# Patient Record
Sex: Female | Born: 1965
Health system: Southern US, Community
[De-identification: ages and names within clinical notes are randomized; demographics above are authoritative.]

## PROBLEM LIST (undated history)

## (undated) ENCOUNTER — Emergency Department (HOSPITAL_BASED_OUTPATIENT_CLINIC_OR_DEPARTMENT_OTHER): Admission: EM | Payer: Self-pay | Source: Home / Self Care

## (undated) DIAGNOSIS — F419 Anxiety disorder, unspecified: Secondary | ICD-10-CM

## (undated) DIAGNOSIS — I10 Essential (primary) hypertension: Secondary | ICD-10-CM

## (undated) DIAGNOSIS — Z8709 Personal history of other diseases of the respiratory system: Secondary | ICD-10-CM

## (undated) DIAGNOSIS — D649 Anemia, unspecified: Secondary | ICD-10-CM

## (undated) DIAGNOSIS — Z9889 Other specified postprocedural states: Secondary | ICD-10-CM

## (undated) DIAGNOSIS — M1990 Unspecified osteoarthritis, unspecified site: Secondary | ICD-10-CM

## (undated) DIAGNOSIS — K219 Gastro-esophageal reflux disease without esophagitis: Secondary | ICD-10-CM

## (undated) DIAGNOSIS — G43909 Migraine, unspecified, not intractable, without status migrainosus: Secondary | ICD-10-CM

## (undated) DIAGNOSIS — T7840XA Allergy, unspecified, initial encounter: Secondary | ICD-10-CM

## (undated) DIAGNOSIS — R112 Nausea with vomiting, unspecified: Secondary | ICD-10-CM

## (undated) DIAGNOSIS — F329 Major depressive disorder, single episode, unspecified: Secondary | ICD-10-CM

## (undated) DIAGNOSIS — E785 Hyperlipidemia, unspecified: Secondary | ICD-10-CM

## (undated) DIAGNOSIS — F32A Depression, unspecified: Secondary | ICD-10-CM

## (undated) DIAGNOSIS — I639 Cerebral infarction, unspecified: Secondary | ICD-10-CM

## (undated) DIAGNOSIS — J45909 Unspecified asthma, uncomplicated: Secondary | ICD-10-CM

## (undated) HISTORY — DX: Anxiety disorder, unspecified: F41.9

## (undated) HISTORY — DX: Essential (primary) hypertension: I10

## (undated) HISTORY — DX: Depression, unspecified: F32.A

## (undated) HISTORY — DX: Hyperlipidemia, unspecified: E78.5

## (undated) HISTORY — DX: Major depressive disorder, single episode, unspecified: F32.9

## (undated) HISTORY — DX: Anemia, unspecified: D64.9

## (undated) HISTORY — DX: Allergy, unspecified, initial encounter: T78.40XA

## (undated) HISTORY — PX: NO PAST SURGERIES: SHX2092

## (undated) HISTORY — PX: BREAST BIOPSY: SHX20

## (undated) HISTORY — DX: Gastro-esophageal reflux disease without esophagitis: K21.9

## (undated) HISTORY — DX: Unspecified osteoarthritis, unspecified site: M19.90

## (undated) HISTORY — DX: Migraine, unspecified, not intractable, without status migrainosus: G43.909

---

## 2001-12-27 ENCOUNTER — Emergency Department (HOSPITAL_COMMUNITY): Admission: EM | Admit: 2001-12-27 | Discharge: 2001-12-27 | Payer: Self-pay | Admitting: Emergency Medicine

## 2001-12-28 ENCOUNTER — Encounter: Payer: Self-pay | Admitting: Emergency Medicine

## 2003-10-21 ENCOUNTER — Emergency Department (HOSPITAL_COMMUNITY): Admission: EM | Admit: 2003-10-21 | Discharge: 2003-10-21 | Payer: Self-pay | Admitting: Emergency Medicine

## 2005-07-18 ENCOUNTER — Emergency Department (HOSPITAL_COMMUNITY): Admission: EM | Admit: 2005-07-18 | Discharge: 2005-07-18 | Payer: Self-pay | Admitting: Emergency Medicine

## 2006-09-27 ENCOUNTER — Inpatient Hospital Stay (HOSPITAL_COMMUNITY): Admission: AD | Admit: 2006-09-27 | Discharge: 2006-09-27 | Payer: Self-pay | Admitting: Obstetrics & Gynecology

## 2006-10-05 ENCOUNTER — Inpatient Hospital Stay (HOSPITAL_COMMUNITY): Admission: RE | Admit: 2006-10-05 | Discharge: 2006-10-05 | Payer: Self-pay | Admitting: Gynecology

## 2007-10-12 ENCOUNTER — Emergency Department (HOSPITAL_COMMUNITY): Admission: EM | Admit: 2007-10-12 | Discharge: 2007-10-12 | Payer: Self-pay | Admitting: Emergency Medicine

## 2007-11-09 ENCOUNTER — Emergency Department (HOSPITAL_COMMUNITY): Admission: EM | Admit: 2007-11-09 | Discharge: 2007-11-09 | Payer: Self-pay | Admitting: Emergency Medicine

## 2008-04-24 ENCOUNTER — Emergency Department (HOSPITAL_COMMUNITY): Admission: EM | Admit: 2008-04-24 | Discharge: 2008-04-24 | Payer: Self-pay | Admitting: Emergency Medicine

## 2009-08-02 ENCOUNTER — Encounter: Admission: RE | Admit: 2009-08-02 | Discharge: 2009-08-02 | Payer: Self-pay | Admitting: Internal Medicine

## 2009-08-27 ENCOUNTER — Encounter: Admission: RE | Admit: 2009-08-27 | Discharge: 2009-08-27 | Payer: Self-pay | Admitting: Infectious Diseases

## 2010-09-22 ENCOUNTER — Encounter: Payer: Self-pay | Admitting: Internal Medicine

## 2011-02-20 ENCOUNTER — Emergency Department (HOSPITAL_COMMUNITY)
Admission: EM | Admit: 2011-02-20 | Discharge: 2011-02-20 | Disposition: A | Discharge status: Home / Self Care | Payer: Self-pay | Attending: Emergency Medicine | Admitting: Emergency Medicine

## 2011-02-20 ENCOUNTER — Emergency Department (HOSPITAL_COMMUNITY): Payer: Self-pay

## 2011-02-20 DIAGNOSIS — M79609 Pain in unspecified limb: Secondary | ICD-10-CM | POA: Insufficient documentation

## 2011-02-20 DIAGNOSIS — M5430 Sciatica, unspecified side: Secondary | ICD-10-CM | POA: Insufficient documentation

## 2011-02-20 DIAGNOSIS — E669 Obesity, unspecified: Secondary | ICD-10-CM | POA: Insufficient documentation

## 2011-02-20 DIAGNOSIS — M545 Low back pain, unspecified: Secondary | ICD-10-CM | POA: Insufficient documentation

## 2011-05-16 ENCOUNTER — Emergency Department (HOSPITAL_COMMUNITY): Payer: Self-pay

## 2011-05-16 ENCOUNTER — Emergency Department (HOSPITAL_COMMUNITY)
Admission: EM | Admit: 2011-05-16 | Discharge: 2011-05-16 | Disposition: A | Discharge status: Home / Self Care | Payer: Self-pay | Attending: Emergency Medicine | Admitting: Emergency Medicine

## 2011-05-16 DIAGNOSIS — J069 Acute upper respiratory infection, unspecified: Secondary | ICD-10-CM | POA: Insufficient documentation

## 2011-05-16 DIAGNOSIS — R059 Cough, unspecified: Secondary | ICD-10-CM | POA: Insufficient documentation

## 2011-05-16 DIAGNOSIS — J3489 Other specified disorders of nose and nasal sinuses: Secondary | ICD-10-CM | POA: Insufficient documentation

## 2011-05-16 DIAGNOSIS — R05 Cough: Secondary | ICD-10-CM | POA: Insufficient documentation

## 2011-05-16 DIAGNOSIS — R062 Wheezing: Secondary | ICD-10-CM | POA: Insufficient documentation

## 2012-08-09 ENCOUNTER — Encounter (HOSPITAL_COMMUNITY): Payer: Self-pay

## 2012-08-09 ENCOUNTER — Emergency Department (HOSPITAL_COMMUNITY)
Admission: EM | Admit: 2012-08-09 | Discharge: 2012-08-09 | Disposition: A | Discharge status: Home / Self Care | Payer: Self-pay | Attending: Emergency Medicine | Admitting: Emergency Medicine

## 2012-08-09 DIAGNOSIS — H53149 Visual discomfort, unspecified: Secondary | ICD-10-CM | POA: Insufficient documentation

## 2012-08-09 DIAGNOSIS — R42 Dizziness and giddiness: Secondary | ICD-10-CM | POA: Insufficient documentation

## 2012-08-09 DIAGNOSIS — R51 Headache: Secondary | ICD-10-CM | POA: Insufficient documentation

## 2012-08-09 DIAGNOSIS — R1110 Vomiting, unspecified: Secondary | ICD-10-CM | POA: Insufficient documentation

## 2012-08-09 LAB — URINALYSIS, ROUTINE W REFLEX MICROSCOPIC
Bilirubin Urine: NEGATIVE
Glucose, UA: NEGATIVE mg/dL
Hgb urine dipstick: NEGATIVE
Ketones, ur: NEGATIVE mg/dL
Nitrite: NEGATIVE
Protein, ur: NEGATIVE mg/dL
Specific Gravity, Urine: 1.014 (ref 1.005–1.030)
Urobilinogen, UA: 0.2 mg/dL (ref 0.0–1.0)
pH: 7.5 (ref 5.0–8.0)

## 2012-08-09 MED ORDER — DIPHENHYDRAMINE HCL 50 MG/ML IJ SOLN
25.0000 mg | Freq: Once | INTRAMUSCULAR | Status: AC
Start: 2012-08-09 — End: 2012-08-09
  Administered 2012-08-09: 25 mg via INTRAVENOUS
  Filled 2012-08-09: qty 1

## 2012-08-09 MED ORDER — METOCLOPRAMIDE HCL 5 MG/ML IJ SOLN
20.0000 mg | Freq: Once | INTRAVENOUS | Status: AC
  Administered 2012-08-09: 20 mg via INTRAVENOUS
  Filled 2012-08-09: qty 4

## 2012-08-09 MED ORDER — SODIUM CHLORIDE 0.9 % IV BOLUS (SEPSIS)
1000.0000 mL | Freq: Once | INTRAVENOUS | Status: AC
  Administered 2012-08-09: 1000 mL via INTRAVENOUS

## 2012-08-09 MED ORDER — DEXAMETHASONE SODIUM PHOSPHATE 10 MG/ML IJ SOLN
10.0000 mg | Freq: Once | INTRAMUSCULAR | Status: AC
  Administered 2012-08-09: 10 mg via INTRAVENOUS
  Filled 2012-08-09: qty 1

## 2012-08-09 MED ORDER — KETOROLAC TROMETHAMINE 30 MG/ML IJ SOLN
15.0000 mg | Freq: Once | INTRAMUSCULAR | Status: AC
  Administered 2012-08-09: 15 mg via INTRAVENOUS
  Filled 2012-08-09: qty 1

## 2012-08-09 MED ORDER — BUTALBITAL-APAP-CAFFEINE 50-325-40 MG PO TABS: 1.0000 | ORAL_TABLET | Freq: Four times a day (QID) | ORAL | Status: DC | PRN

## 2012-08-09 NOTE — ED Provider Notes (Signed)
History     CSN: [id number]  Arrival date & time 08/09/12  4098   First MD Initiated Contact with Patient 08/09/12 [phone]      Chief Complaint  Patient presents with  . Headache     HPI  The patient presents with a headache.  This headache began approximately one week ago.  Since onset the been progressive.  She describes pain throughout the anterior head.  The pain is throbbing.  There is associated photophobia, nausea.  She had one episode of emesis.  She notes no improvement with OTC medication.  She denies distal weakness or discoordination, or disequilibrium. She denies fevers, chills, diarrhea, incontinence, ataxia. She has a history of headaches, though she states that this is more severe.  She has been evaluated previously with CT.  Notably, the patient recently started a new position, and she describes significant increase in her stress level.  She also spends more time and previously on a computer, squinting according to her and her husband.  History reviewed. No pertinent past medical history.  History reviewed. No pertinent past surgical history.  Family History  Problem Relation Age of Onset  . Diabetes Mother   . Stroke Mother   . Cancer Mother   . Diabetes Father     History  Substance Use Topics  . Smoking status: Never Smoker   . Smokeless tobacco: Not on file  . Alcohol Use: Yes     Comment: occasional    OB History    Grav Para Term Preterm Abortions TAB SAB Ect Mult Living                  Review of Systems  Constitutional:       Per HPI, otherwise negative  HENT:       Per HPI, otherwise negative  Eyes: Negative.   Respiratory:       Per HPI, otherwise negative  Cardiovascular:       Per HPI, otherwise negative  Gastrointestinal: Positive for nausea and vomiting.  Genitourinary: Negative.   Musculoskeletal:       Per HPI, otherwise negative  Skin: Negative.   Neurological: Positive for light-headedness and headaches. Negative for  dizziness, tremors, seizures, syncope, facial asymmetry, speech difficulty, weakness and numbness.    Allergies  Review of patient's allergies indicates not on file.  Home Medications  No current outpatient prescriptions on file.  BP 141/71  Pulse 71  Temp 98.6 F (37 C) (Oral)  Resp 16  SpO2 96%  LMP 08/06/2012  Physical Exam  Nursing note and vitals reviewed. Constitutional: She is oriented to person, place, and time. She appears well-developed and well-nourished. No distress.  HENT:  Head: Normocephalic and atraumatic.  Eyes: Conjunctivae normal and EOM are normal. Pupils are equal, round, and reactive to light. Right eye exhibits no discharge. Left eye exhibits no discharge. No scleral icterus.  Cardiovascular: Normal rate and regular rhythm.   Pulmonary/Chest: Effort normal and breath sounds normal. No stridor. No respiratory distress.  Abdominal: She exhibits no distension.  Musculoskeletal: She exhibits no edema.  Neurological: She is alert and oriented to person, place, and time. She displays no atrophy and no tremor. No cranial nerve deficit or sensory deficit. She exhibits normal muscle tone. She displays no seizure activity. Coordination normal.  Skin: Skin is warm and dry.  Psychiatric: She has a normal mood and affect.    ED Course  Procedures (including critical care time)   Labs Reviewed  URINALYSIS, ROUTINE  W REFLEX MICROSCOPIC   No results found.   No diagnosis found.   11:59 AM Patient resting.  She states that she feels substantially better. MDM  This generally well-appearing female, with history of headaches now presents with a severe headache.  On exam she is neurovascularly intact throughout, and in no distress.  Given the patient's prior evaluation, including CT, imaging was not indicated.  Following fluids, analgesics patient was substantially improved.  Absent concerning signs or symptoms the patient was discharged to follow up with her primary  care physician.  She was provided a short course of analgesics, instructed to discuss this, and consider alternatives with her primary care physician as soon as possible to  Gerhard Munch, MD 08/09/12 1200

## 2012-08-09 NOTE — ED Notes (Signed)
Patient reports that she has been having a mild headache x several days, but today she woke at 0400 with increased pain, nausea, vomiting, photophobia, and sensitivity with sound. Patient denies having a history of headaches/migraines.

## 2012-08-09 NOTE — Progress Notes (Signed)
Pt listed with no insurance coverage CM and [facility] liaison spoke with him Pt offered services to assist with finding a [place] self pay provider & health reform information 

## 2012-10-17 ENCOUNTER — Emergency Department (HOSPITAL_COMMUNITY): Payer: Self-pay

## 2012-10-17 ENCOUNTER — Emergency Department (HOSPITAL_COMMUNITY)
Admission: EM | Admit: 2012-10-17 | Discharge: 2012-10-17 | Disposition: A | Discharge status: Home / Self Care | Payer: Self-pay | Attending: Emergency Medicine | Admitting: Emergency Medicine

## 2012-10-17 ENCOUNTER — Encounter (HOSPITAL_COMMUNITY): Payer: Self-pay | Admitting: Emergency Medicine

## 2012-10-17 DIAGNOSIS — R6889 Other general symptoms and signs: Secondary | ICD-10-CM

## 2012-10-17 DIAGNOSIS — J3489 Other specified disorders of nose and nasal sinuses: Secondary | ICD-10-CM | POA: Insufficient documentation

## 2012-10-17 DIAGNOSIS — R0602 Shortness of breath: Secondary | ICD-10-CM | POA: Insufficient documentation

## 2012-10-17 DIAGNOSIS — R071 Chest pain on breathing: Secondary | ICD-10-CM | POA: Insufficient documentation

## 2012-10-17 DIAGNOSIS — J45909 Unspecified asthma, uncomplicated: Secondary | ICD-10-CM | POA: Insufficient documentation

## 2012-10-17 DIAGNOSIS — R51 Headache: Secondary | ICD-10-CM | POA: Insufficient documentation

## 2012-10-17 HISTORY — DX: Unspecified asthma, uncomplicated: J45.909

## 2012-10-17 LAB — CBC
Hemoglobin: 12.4 g/dL (ref 12.0–15.0)
RBC: 4.14 MIL/uL (ref 3.87–5.11)

## 2012-10-17 LAB — BASIC METABOLIC PANEL
BUN: 8 mg/dL (ref 6–23)
CO2: 24 mEq/L (ref 19–32)
Chloride: 104 mEq/L (ref 96–112)
GFR calc Af Amer: >90 mL/min (ref >90)

## 2012-10-17 LAB — POCT I-STAT TROPONIN I: Troponin i, poc: 0 ng/mL (ref 0.00–0.08)

## 2012-10-17 MED ORDER — PREDNISONE 10 MG PO TABS: 20.0000 mg | ORAL_TABLET | Freq: Every day | ORAL | Status: DC

## 2012-10-17 MED ORDER — GUAIFENESIN 100 MG/5ML PO LIQD: 100.0000 mg | ORAL | Status: DC | PRN

## 2012-10-17 MED ORDER — HYDROCODONE-ACETAMINOPHEN 7.5-325 MG/15ML PO SOLN: 10.0000 mL | Freq: Four times a day (QID) | ORAL | Status: DC | PRN

## 2012-10-17 NOTE — ED Notes (Signed)
Discharge instructions reviewed w/ pt verbalizes understanding. Three prescriptions provided at discharge.

## 2012-10-17 NOTE — ED Provider Notes (Signed)
Medical screening examination/treatment/procedure(s) were performed by non-physician practitioner and as supervising physician I was immediately available for consultation/collaboration.   Richardean Canal, MD 10/17/12 1046

## 2012-10-17 NOTE — ED Notes (Signed)
Patient reports that she has had cough and cold since Tues. The patient also having right sided chest pain with rest and exertion. The patient has had to use Inhaler recently.

## 2012-10-17 NOTE — ED Provider Notes (Signed)
History     CSN: [id number]  Arrival date & time 10/17/12  1191   First MD Initiated Contact with Patient 10/17/12 1007      Chief Complaint  Patient presents with  . Shortness of Breath  . Chest Pain    (Consider location/radiation/quality/duration/timing/severity/associated sxs/prior treatment) HPI  47 year old female with history of asthma presents complaining of flulike symptoms. Patient reports for the past 4 days she developed gradual onset of headache, runny nose, nasal congestion, sneezing, throat irritation, nonproductive cough, pleuritic chest pain, and increased shortness of breath. States the shortness of breath is constant, moderate in severity requiring for her to use the albuterol inhaler, 2 puffs, every 3 hours for the past 3 days. She feels weak and tired, and has lack of appetite. She has tried taking Alka-Seltzer, and NyQuil, with minimal relief. Patient denies lightheadedness on dizziness. Denies exertional chest pain, dyspnea on exertion, or hemoptysis, nausea, vomiting, diarrhea, abdominal pain, back pain, dysuria, or rash. Patient is a nonsmoker. She has a fairly uncomplicated history of asthma with no prior history of intubation, ICU stay.   Past Medical History  Diagnosis Date  . Asthma     History reviewed. No pertinent past surgical history.  Family History  Problem Relation Age of Onset  . Diabetes Mother   . Stroke Mother   . Cancer Mother   . Diabetes Father     History  Substance Use Topics  . Smoking status: Never Smoker   . Smokeless tobacco: Not on file  . Alcohol Use: Yes     Comment: occasional    OB History   Grav Para Term Preterm Abortions TAB SAB Ect Mult Living                  Review of Systems  Constitutional:       10 Systems reviewed and all are negative for acute change except as noted in the HPI.     Allergies  Review of patient's allergies indicates no known allergies.  Home Medications   Current Outpatient Rx   Name  Route  Sig  Dispense  Refill  . butalbital-acetaminophen-caffeine (FIORICET) 50-325-40 MG per tablet   Oral   Take 1 tablet by mouth every 6 (six) hours as needed for headache.   15 tablet   0   . Multiple Vitamin (MULTIVITAMIN WITH MINERALS) TABS   Oral   Take 1 tablet by mouth daily.           BP 122/81  Pulse 85  Temp(Src) 98.9 F (37.2 C) (Oral)  SpO2 98%  LMP 09/28/2012  Physical Exam  Nursing note and vitals reviewed. Constitutional: She is oriented to person, place, and time. She appears well-developed and well-nourished. No distress.  Awake, alert, nontoxic appearance  HENT:  Head: Atraumatic.  Right Ear: External ear normal.  Left Ear: External ear normal.  Nose: Nose normal.  Mouth/Throat: Oropharynx is clear and moist. No oropharyngeal exudate.  Eyes: Conjunctivae are normal. Right eye exhibits no discharge. Left eye exhibits no discharge.  Neck: Neck supple. No JVD present.  Cardiovascular: Normal rate and regular rhythm.   Pulmonary/Chest: Effort normal. No respiratory distress. She has no wheezes. She has no rales. She exhibits no tenderness.  Abdominal: Soft. There is no tenderness. There is no rebound.  Musculoskeletal: Normal range of motion. She exhibits no edema and no tenderness.  ROM appears intact, no obvious focal weakness  Lymphadenopathy:    She has no cervical adenopathy.  Neurological:  She is alert and oriented to person, place, and time.  Mental status and motor strength appears intact  Skin: No rash noted.  Psychiatric: She has a normal mood and affect.    ED Course  Procedures (including critical care time)  Labs Reviewed  BASIC METABOLIC PANEL - Abnormal; Notable for the following:    Glucose, Bld 102 (*)    Creatinine, Ser 0.48 (*)    All other components within normal limits  CBC  POCT I-STAT TROPONIN I   Dg Chest 2 View  10/17/2012  *RADIOLOGY REPORT*  Clinical Data: Shortness of breath, chest pain.  CHEST - 2 VIEW   Comparison: 05/16/2011  Findings: Mild peribronchial thickening. Heart and mediastinal contours are within normal limits.  No focal opacities or effusions.  No acute bony abnormality.  IMPRESSION: No active cardiopulmonary disease.   Original Report Authenticated By: Charlett Nose, M.D.      No diagnosis found.  10:23 AM  Patient was seen and evaluate by me for flulike symptoms. She appear to be in no acute respiratory distress. She is afebrile with stable normal vital sign. Her workup including chest x-rays and blood work appears to be unremarkable. I have very low suspicion for cardiac etiology. Plan to treat patient symptomatically with prednisone taper course, hycet elixir for her cough and pleuritic chest pain, and guaifenesin for her nasal congestion. Return precautions discussed. Patient stable for discharge.  BP 122/81  Pulse 85  Temp(Src) 98.9 F (37.2 C) (Oral)  SpO2 98%  LMP 09/28/2012  I have reviewed nursing notes and vital signs. I personally reviewed the imaging tests through PACS system  I reviewed available ER/hospitalization records thought the EMR  1. Flu-like symptoms MDM          Fayrene Helper, PA-C 10/17/12 1025

## 2013-09-01 DIAGNOSIS — I639 Cerebral infarction, unspecified: Secondary | ICD-10-CM

## 2013-09-01 HISTORY — DX: Cerebral infarction, unspecified: I63.9

## 2014-01-26 ENCOUNTER — Telehealth: Payer: Self-pay | Admitting: *Deleted

## 2014-01-26 ENCOUNTER — Encounter: Payer: Self-pay | Admitting: Family Medicine

## 2014-01-26 NOTE — Telephone Encounter (Signed)
Dr Ronnald Ramp called. He states he has faxed an EKG for an cardiologist here to evaluate. Per Dr Ronnald Ramp-- patient is 48 yr old lady; complaining of chest fluttering. Dr Ronnald Ramp states when he listen heart sound is normal and EKG looks normal.   Dr. Ronnald Ramp would like a doctor to review EKG TO SEE IF PATIENT NEED ANY SERVICE FROM CARDIOLOGIST. RN received EKG.  EKG was reviewed by Dr Sallyanne Kuster..  Dr Sallyanne Kuster states he agrees with the interpretation on the EKG.  Dr Sallyanne Kuster suggests if issue is daily-holter monitor, if spartic - event monitor,and echo.RN relayed message to Dr. Ronnald Ramp. Dr Ronnald Ramp voiced thanks, and stated he would start a beta blocker now because her blood pressure is elevated. If not resolved he states he will send patient to be evaluated by cardiologist at [facility].

## 2014-04-24 ENCOUNTER — Emergency Department (HOSPITAL_COMMUNITY): Payer: BC Managed Care – PPO

## 2014-04-24 ENCOUNTER — Inpatient Hospital Stay (HOSPITAL_COMMUNITY)
Admission: EM | Admit: 2014-04-24 | Discharge: 2014-04-27 | DRG: 103 | Disposition: A | Discharge status: Home / Self Care | Payer: BC Managed Care – PPO | Attending: Internal Medicine | Admitting: Internal Medicine

## 2014-04-24 ENCOUNTER — Encounter (HOSPITAL_COMMUNITY): Payer: Self-pay | Admitting: Emergency Medicine

## 2014-04-24 DIAGNOSIS — G43109 Migraine with aura, not intractable, without status migrainosus: Secondary | ICD-10-CM | POA: Diagnosis present

## 2014-04-24 DIAGNOSIS — I639 Cerebral infarction, unspecified: Secondary | ICD-10-CM | POA: Diagnosis present

## 2014-04-24 DIAGNOSIS — R5381 Other malaise: Secondary | ICD-10-CM

## 2014-04-24 DIAGNOSIS — Z833 Family history of diabetes mellitus: Secondary | ICD-10-CM | POA: Diagnosis not present

## 2014-04-24 DIAGNOSIS — Z87891 Personal history of nicotine dependence: Secondary | ICD-10-CM | POA: Diagnosis not present

## 2014-04-24 DIAGNOSIS — I6350 Cerebral infarction due to unspecified occlusion or stenosis of unspecified cerebral artery: Secondary | ICD-10-CM

## 2014-04-24 DIAGNOSIS — G8190 Hemiplegia, unspecified affecting unspecified side: Secondary | ICD-10-CM | POA: Diagnosis present

## 2014-04-24 DIAGNOSIS — J45909 Unspecified asthma, uncomplicated: Secondary | ICD-10-CM | POA: Diagnosis present

## 2014-04-24 DIAGNOSIS — R531 Weakness: Secondary | ICD-10-CM | POA: Diagnosis present

## 2014-04-24 DIAGNOSIS — Z79899 Other long term (current) drug therapy: Secondary | ICD-10-CM | POA: Diagnosis not present

## 2014-04-24 DIAGNOSIS — Z823 Family history of stroke: Secondary | ICD-10-CM

## 2014-04-24 DIAGNOSIS — R5383 Other fatigue: Secondary | ICD-10-CM

## 2014-04-24 DIAGNOSIS — R29898 Other symptoms and signs involving the musculoskeletal system: Secondary | ICD-10-CM | POA: Diagnosis present

## 2014-04-24 DIAGNOSIS — I6340 Cerebral infarction due to embolism of unspecified cerebral artery: Secondary | ICD-10-CM

## 2014-04-24 LAB — URINALYSIS, ROUTINE W REFLEX MICROSCOPIC
Bilirubin Urine: NEGATIVE
Glucose, UA: NEGATIVE mg/dL
Hgb urine dipstick: NEGATIVE
Ketones, ur: NEGATIVE mg/dL
Leukocytes, UA: NEGATIVE
Nitrite: NEGATIVE
Protein, ur: 30 mg/dL — AB
Specific Gravity, Urine: 1.014 (ref 1.005–1.030)
Urobilinogen, UA: 0.2 mg/dL (ref 0.0–1.0)
pH: 6 (ref 5.0–8.0)

## 2014-04-24 LAB — BASIC METABOLIC PANEL
Anion gap: 12 (ref 5–15)
BUN: 10 mg/dL (ref 6–23)
CO2: 21 mEq/L (ref 19–32)
Calcium: 9 mg/dL (ref 8.4–10.5)
Chloride: 106 mEq/L (ref 96–112)
Creatinine, Ser: 0.57 mg/dL (ref 0.50–1.10)
GFR calc Af Amer: >90 mL/min (ref >90)
GFR calc non Af Amer: >90 mL/min (ref >90)
Glucose, Bld: 101 mg/dL — ABNORMAL HIGH (ref 70–99)
Potassium: 4.3 mEq/L (ref 3.7–5.3)
Sodium: 139 mEq/L (ref 137–147)

## 2014-04-24 LAB — PREGNANCY, URINE: Preg Test, Ur: NEGATIVE

## 2014-04-24 LAB — RAPID URINE DRUG SCREEN, HOSP PERFORMED
AMPHETAMINES: NOT DETECTED
BENZODIAZEPINES: NOT DETECTED
Barbiturates: NOT DETECTED
COCAINE: NOT DETECTED
OPIATES: NOT DETECTED
TETRAHYDROCANNABINOL: NOT DETECTED

## 2014-04-24 LAB — CBC
HCT: 34.9 % — ABNORMAL LOW (ref 36.0–46.0)
Hemoglobin: 12 g/dL (ref 12.0–15.0)
MCH: 29.7 pg (ref 26.0–34.0)
MCHC: 34.4 g/dL (ref 30.0–36.0)
MCV: 86.4 fL (ref 78.0–100.0)
Platelets: 264 10*3/uL (ref 150–400)
RBC: 4.04 MIL/uL (ref 3.87–5.11)
RDW: 13.1 % (ref 11.5–15.5)
WBC: 5 10*3/uL (ref 4.0–10.5)

## 2014-04-24 LAB — I-STAT TROPONIN, ED: Troponin i, poc: 0.01 ng/mL (ref 0.00–0.08)

## 2014-04-24 LAB — URINE MICROSCOPIC-ADD ON

## 2014-04-24 MED ORDER — MORPHINE SULFATE 2 MG/ML IJ SOLN
2.0000 mg | INTRAMUSCULAR | Status: DC | PRN
  Administered 2014-04-24 – 2014-04-26 (×3): 2 mg via INTRAVENOUS
  Filled 2014-04-24 (×3): qty 1

## 2014-04-24 MED ORDER — PNEUMOCOCCAL VAC POLYVALENT 25 MCG/0.5ML IJ INJ
0.5000 mL | INJECTION | INTRAMUSCULAR | Status: AC
  Administered 2014-04-25: 0.5 mL via INTRAMUSCULAR

## 2014-04-24 MED ORDER — ONDANSETRON HCL 4 MG/2ML IJ SOLN
4.0000 mg | Freq: Once | INTRAMUSCULAR | Status: AC
  Administered 2014-04-24: 4 mg via INTRAVENOUS
  Filled 2014-04-24: qty 2

## 2014-04-24 MED ORDER — MORPHINE SULFATE 4 MG/ML IJ SOLN
4.0000 mg | Freq: Once | INTRAMUSCULAR | Status: AC
  Administered 2014-04-24: 4 mg via INTRAVENOUS
  Filled 2014-04-24: qty 1

## 2014-04-24 MED ORDER — LORAZEPAM 2 MG/ML IJ SOLN
1.0000 mg | Freq: Once | INTRAMUSCULAR | Status: DC
  Filled 2014-04-24: qty 1

## 2014-04-24 MED ORDER — ASPIRIN 81 MG PO CHEW
324.0000 mg | CHEWABLE_TABLET | Freq: Once | ORAL | Status: AC
  Administered 2014-04-24: 324 mg via ORAL
  Filled 2014-04-24: qty 4

## 2014-04-24 MED ORDER — MORPHINE SULFATE 2 MG/ML IJ SOLN
2.0000 mg | Freq: Once | INTRAMUSCULAR | Status: AC
  Administered 2014-04-24: 2 mg via INTRAVENOUS
  Filled 2014-04-24: qty 1

## 2014-04-24 MED ORDER — SODIUM CHLORIDE 0.9 % IV SOLN
INTRAVENOUS | Status: AC
  Administered 2014-04-24: 19:00:00 via INTRAVENOUS

## 2014-04-24 MED ORDER — STROKE: EARLY STAGES OF RECOVERY BOOK
Freq: Once | Status: AC
  Administered 2014-04-25: 12:00:00
  Filled 2014-04-24: qty 1

## 2014-04-24 MED ORDER — SODIUM CHLORIDE 0.9 % IV SOLN: INTRAVENOUS | Status: DC

## 2014-04-24 MED ORDER — ONDANSETRON HCL 4 MG/2ML IJ SOLN
4.0000 mg | Freq: Four times a day (QID) | INTRAMUSCULAR | Status: DC | PRN
  Administered 2014-04-25: 4 mg via INTRAVENOUS
  Filled 2014-04-24: qty 2

## 2014-04-24 NOTE — ED Notes (Signed)
Patient transported to CT 

## 2014-04-24 NOTE — Progress Notes (Signed)
Report recd from Jana Half, [place] at [facility] at [place]. Pt arrived to unit per stretcher from [facility] with ambulance personnel and transferred to bed at [facility].  Assessment performed. Family member at bedside. Swallow study performed as charted. Paged admission for MD on call for pt for diet order.

## 2014-04-24 NOTE — ED Notes (Signed)
Pt sent to unit.  [facility] admit canceled and changed to [facility].  Pt brought back to ED Rm 25

## 2014-04-24 NOTE — ED Notes (Signed)
Pt states 3 days ago pt had pain in legs where she gets up from her desk they would go numb. States last night center chest began to hurt and this morning her left arm feels heavy. Denis SOB or n/v.

## 2014-04-24 NOTE — H&P (Addendum)
[facility] History and Physical  OLEVA KOO NWG:[id number] DOB: Sep 24, 1965 DOA: 04/24/2014  Referring physician: ER physician PCP: Philis Fendt, MD   Chief Complaint: bilateral LE weakness  HPI:  Pt is 48 yo female who presented to [facility] ED with main concern of one day duration of bilateral LE weakness L>R, progressed to involve left arm weakness, associated with tingling. Pt also reports several episodes of 15 minutes long chest pains, located in the center of the chest, 5/10 in severity, occasionally but not consistently associated with left arm and leg numbness. Pt denies any specific alleviating factors, no similar events in the past. The patient has a significant family hx of stroke: her mother and grandmother have both had "massive strokes" and are deceased. She denies nausea, vomiting, diaphoresis.  In ED, pt noted to be hemodynamically stable, not a tPA candidate due to onset of symptoms > 4 hours. Due to concern of stroke/TIA, transfer to [facility] recommended for further work up.   Assessment & Plan    Active Problems: Bilateral LE weakness with left arm weakness and numbness - will admit to telemetry unit at [facility] hospital - proceed with MRI brain - place order for SLP, PT/OT - carotid doppler, 2 D ECHO - lipid panel, A1C - provide aspirin - advance diet once swallow eval passed    DVT prophylaxis: SCD's  Radiological Exams on Admission:  Dg Chest 2 View  04/24/2014  Normal chest x-ray.    Ct Head Wo Contrast  04/24/2014   There is no acute intracranial abnormality.     EKG: pending   Code Status: Full Family Communication: Plan of care discussed with the patient  Disposition Plan: Admit for further evaluation  Leisa Lenz, MD  [facility] Pager [phone]  Review of Systems:  Constitutional: Negative for fever, chills and malaise/fatigue. Negative for diaphoresis.  HENT: Negative for hearing loss, ear pain, nosebleeds, congestion, sore throat,   Eyes: Negative for blurred vision, double vision, photophobia, pain, discharge and redness.  Respiratory: Negative for cough, hemoptysis, sputum production, shortness of breath, wheezing and stridor.   Cardiovascular: Negative for chest pain, palpitations, orthopnea, claudication and leg swelling.  Gastrointestinal: Negative for nausea, vomiting and abdominal pain.  Genitourinary: Negative for dysuria, urgency, frequency, hematuria and flank pain.  Musculoskeletal: Negative for myalgias, back pain, joint pain and falls.  Skin: Negative for itching and rash.  Neurological: Per HPI  Endo/Heme/Allergies: Negative for environmental allergies and polydipsia. Does not bruise/bleed easily.  Psychiatric/Behavioral: Negative for suicidal ideas. The patient is not nervous/anxious.      Past Medical History  Diagnosis Date  . Asthma    Past Surgical History  Procedure Laterality Date  . No past surgeries     Social History:  reports that she quit smoking about 6 years ago. She has never used smokeless tobacco. She reports that she drinks alcohol. She reports that she does not use illicit drugs.  Allergies  Allergen Reactions  . Shellfish Allergy Hives    Family History:  Family History  Problem Relation Age of Onset  . Diabetes Mother   . Stroke Mother   . Cancer Mother   . Diabetes Father      Prior to Admission medications   Medication Sig Start Date End Date Taking? Authorizing Provider  albuterol (PROVENTIL HFA;VENTOLIN HFA) 108 (90 BASE) MCG/ACT inhaler Inhale 2 puffs into the lungs every 6 (six) hours as needed for wheezing.   Yes Historical Provider, MD  aspirin-acetaminophen-caffeine (EXCEDRIN MIGRAINE) [phone] MG  per tablet Take 1 tablet by mouth once as needed for migraine.    Yes Historical Provider, MD  diphenhydrAMINE (BENADRYL) 25 MG tablet Take 25 mg by mouth daily as needed for allergies.   Yes Historical Provider, MD  naproxen sodium (ANAPROX) 220 MG tablet Take  660 mg by mouth once as needed (pain.).   Yes Historical Provider, MD   Physical Exam: Filed Vitals:   04/24/14 1205 04/24/14 1528  BP: 140/95 145/87  Pulse: 80 76  Temp: 98.8 F (37.1 C)   TempSrc: Oral   Resp: 18 18  SpO2: 98% 96%    Physical Exam  Constitutional: Appears well-developed and well-nourished. No distress.  HENT: Normocephalic. No tonsillar erythema or exudates Eyes: Conjunctivae and EOM are normal. PERRLA, no scleral icterus.  Neck: Normal ROM. Neck supple. No JVD. No tracheal deviation. No thyromegaly.  CVS: RRR, S1/S2 +, no murmurs, no gallops, no carotid bruit.  Pulmonary: Effort and breath sounds normal, no stridor, rhonchi, wheezes, rales.  Abdominal: Soft. BS +,  no distension, tenderness, rebound or guarding.  Musculoskeletal: Normal range of motion. No edema and no tenderness.  Lymphadenopathy: No lymphadenopathy noted, cervical, inguinal. Neuro: Alert. Normal reflexes, muscle tone coordination. No focal neurologic deficits. Skin: Skin is warm and dry. No rash noted. Not diaphoretic. No erythema. No pallor.  Psychiatric: Normal mood and affect. Behavior, judgment, thought content normal.   Labs on Admission:  Basic Metabolic Panel:  Recent Labs Lab 04/24/14 1219  NA 139  K 4.3  CL 106  CO2 21  GLUCOSE 101*  BUN 10  CREATININE 0.57  CALCIUM 9.0   CBC:  Recent Labs Lab 04/24/14 1219  WBC 5.0  HGB 12.0  HCT 34.9*  MCV 86.4  PLT 264   If 7PM-7AM, please contact night-coverage [email or web address] Password [facility] 04/24/2014, 3:51 PM

## 2014-04-24 NOTE — ED Notes (Signed)
Bed: YC14 Expected date: 04/24/14 Expected time: 12:04 PM Means of arrival: Ambulance Comments: [facility]

## 2014-04-24 NOTE — ED Notes (Signed)
[facility] here for transport.  

## 2014-04-24 NOTE — ED Provider Notes (Signed)
CSN: [id number]     Arrival date & time 04/24/14  1155 History   First MD Initiated Contact with Patient 04/24/14 1206     Chief Complaint  Patient presents with  . Chest Pain     (Consider location/radiation/quality/duration/timing/severity/associated sxs/prior Treatment) HPI  Patient to the ED with complaints of bilateral lower extremity weakness and pain, most severe to the left leg, left arm weakness and chest pains. The symptoms started at work yesterday with tingling and weakness. As the night progressed she started to have increased weakness to the left leg and developed pain to the center of her chest. This morning she woke up with left leg and left arm weakness and chest pain. She reports feeling as though she is dragging the left foot mildly but also having swelling and pain to bilateral lower extremities. The patient has a significant family hx of stroke her mother and grandmother have both had "massive strokes" and are deceased. She denies nausea, vomiting, diaphoresis, diarrhea,  Aphagia, SOB, change in vision, difficulty remembering or talking. She endorses doing more physical activity for work last night than she is used to.  Past Medical History  Diagnosis Date  . Asthma    History reviewed. No pertinent past surgical history. Family History  Problem Relation Age of Onset  . Diabetes Mother   . Stroke Mother   . Cancer Mother   . Diabetes Father    History  Substance Use Topics  . Smoking status: Never Smoker   . Smokeless tobacco: Not on file  . Alcohol Use: Yes     Comment: occasional   OB History   Grav Para Term Preterm Abortions TAB SAB Ect Mult Living                 Review of Systems  Constitutional: Positive for fatigue. Negative for fever and diaphoresis.  Respiratory: Negative for cough and shortness of breath.   Cardiovascular: Positive for chest pain and leg swelling.  Genitourinary: Negative for difficulty urinating.  Neurological: Positive for  weakness and headaches.       Allergies  Shellfish allergy  Home Medications   Prior to Admission medications   Medication Sig Start Date End Date Taking? Authorizing Provider  albuterol (PROVENTIL HFA;VENTOLIN HFA) 108 (90 BASE) MCG/ACT inhaler Inhale 2 puffs into the lungs every 6 (six) hours as needed for wheezing.   Yes Historical Provider, MD  aspirin-acetaminophen-caffeine (EXCEDRIN MIGRAINE) [phone] MG per tablet Take 1 tablet by mouth once as needed for migraine.    Yes Historical Provider, MD  diphenhydrAMINE (BENADRYL) 25 MG tablet Take 25 mg by mouth daily as needed for allergies.   Yes Historical Provider, MD  naproxen sodium (ANAPROX) 220 MG tablet Take 660 mg by mouth once as needed (pain.).   Yes Historical Provider, MD   BP 140/95  Pulse 80  Temp(Src) 98.8 F (37.1 C) (Oral)  Resp 18  SpO2 98%  LMP 03/27/2014 Physical Exam  Nursing note and vitals reviewed. Constitutional: She is oriented to person, place, and time. She appears well-developed and well-nourished. No distress.  HENT:  Head: Normocephalic and atraumatic.  Eyes: Pupils are equal, round, and reactive to light.  Neck: Normal range of motion. Neck supple.  Cardiovascular: Normal rate and regular rhythm.   Pulmonary/Chest: Effort normal.  Abdominal: Soft.  Musculoskeletal:  Swelling to bilateral lower legs with pain to palpation of movement of legs. Unable to assess strength as patient could not tolerate lifting legs due to pain.  Neurological: She is alert and oriented to person, place, and time. No cranial nerve deficit. GCS eye subscore is 4. GCS verbal subscore is 5. GCS motor subscore is 6.  noticeably more weak to the left arm than the right. Asymmetrical grip strengths, decreased to left arm.   Skin: Skin is warm and dry.    ED Course  Procedures (including critical care time) Labs Review Labs Reviewed  CBC - Abnormal; Notable for the following:    HCT 34.9 (*)    All other  components within normal limits  BASIC METABOLIC PANEL - Abnormal; Notable for the following:    Glucose, Bld 101 (*)    All other components within normal limits  URINALYSIS, ROUTINE W REFLEX MICROSCOPIC - Abnormal; Notable for the following:    APPearance CLOUDY (*)    Protein, ur 30 (*)    All other components within normal limits  PREGNANCY, URINE  URINE MICROSCOPIC-ADD ON  Randolm Idol, ED    Imaging Review Dg Chest 2 View  04/24/2014   CLINICAL DATA:  Chest pain.  EXAM: CHEST  2 VIEW  COMPARISON:  10/17/2012  FINDINGS: The cardiac silhouette, mediastinal and hilar contours are normal. The lungs are clear. The bony thorax is intact.  IMPRESSION: Normal chest x-ray.   Electronically Signed   By: Kalman Jewels M.D.   On: 04/24/2014 13:06   Ct Head Wo Contrast  04/24/2014   CLINICAL DATA:  One day history of left-sided weakness  EXAM: CT HEAD WITHOUT CONTRAST  TECHNIQUE: Contiguous axial images were obtained from the base of the skull through the vertex without intravenous contrast.  COMPARISON:  None.  FINDINGS: The ventricles are normal in size and position. There is no intracranial hemorrhage nor intracranial mass effect. No acute ischemic changes are demonstrated. There are no abnormal intracranial calcifications. The cerebellum and brainstem are normal.  The observed paranasal sinuses and mastoid air cells are clear. There is no acute skull fracture.  IMPRESSION: There is no acute intracranial abnormality.   Electronically Signed   By: David  Martinique   On: 04/24/2014 13:41     EKG Interpretation   Date/Time:  Monday April 24 2014 12:04:04 EDT Ventricular Rate:  80 PR Interval:  162 QRS Duration: 96 QT Interval:  440 QTC Calculation: 508 R Axis:   0 Text Interpretation:  Sinus rhythm Borderline T wave abnormalities  Borderline prolonged QT interval No significant change since last tracing  Confirmed by [place] ([id number]) on 04/24/2014 2:37:40 PM      MDM    Final diagnoses:  Weakness    pts head CT is unremarkable.  She continues to have left arm and leg weakness. CP has resolved with aspirin and pain medications. Does not meet criteria for code stroke or need immediate neurology intervention as symptoms have been persistent since last night.  Spoke with Dr. Wilson Singer who feels that admitting to medicine for MRI of brain is appropriate. Patient admitted to [facility], inpatient, [place], [facility], Team [address] [place] 04/24/14 1457

## 2014-04-24 NOTE — ED Notes (Signed)
Pt refusing Morphine for pain.  Report given to floor about refusal.

## 2014-04-24 NOTE — ED Notes (Signed)
[facility] at bedside 

## 2014-04-25 ENCOUNTER — Inpatient Hospital Stay (HOSPITAL_COMMUNITY): Payer: BC Managed Care – PPO

## 2014-04-25 DIAGNOSIS — I6340 Cerebral infarction due to embolism of unspecified cerebral artery: Secondary | ICD-10-CM

## 2014-04-25 DIAGNOSIS — I6789 Other cerebrovascular disease: Secondary | ICD-10-CM

## 2014-04-25 LAB — BASIC METABOLIC PANEL
Anion gap: 12 (ref 5–15)
BUN: 11 mg/dL (ref 6–23)
CHLORIDE: 105 meq/L (ref 96–112)
CO2: 22 mEq/L (ref 19–32)
Calcium: 8.4 mg/dL (ref 8.4–10.5)
Creatinine, Ser: 0.58 mg/dL (ref 0.50–1.10)
GFR calc Af Amer: >90 mL/min (ref >90)
GLUCOSE: 106 mg/dL — AB (ref 70–99)
Potassium: 3.7 mEq/L (ref 3.7–5.3)
Sodium: 139 mEq/L (ref 137–147)

## 2014-04-25 LAB — CBC
HEMATOCRIT: 32.7 % — AB (ref 36.0–46.0)
Hemoglobin: 11.2 g/dL — ABNORMAL LOW (ref 12.0–15.0)
MCH: 29.7 pg (ref 26.0–34.0)
MCHC: 34.3 g/dL (ref 30.0–36.0)
MCV: 86.7 fL (ref 78.0–100.0)
Platelets: 241 10*3/uL (ref 150–400)
RBC: 3.77 MIL/uL — ABNORMAL LOW (ref 3.87–5.11)
RDW: 13.3 % (ref 11.5–15.5)
WBC: 4.9 10*3/uL (ref 4.0–10.5)

## 2014-04-25 LAB — LIPID PANEL
Cholesterol: 152 mg/dL (ref 0–200)
HDL: 28 mg/dL — AB (ref >39)
LDL CALC: 113 mg/dL — AB (ref 0–99)
TRIGLYCERIDES: 57 mg/dL (ref <150)
Total CHOL/HDL Ratio: 5.4 RATIO
VLDL: 11 mg/dL (ref 0–40)

## 2014-04-25 LAB — HEMOGLOBIN A1C
Hgb A1c MFr Bld: 5.2 % (ref <5.7)
Mean Plasma Glucose: 103 mg/dL (ref <117)

## 2014-04-25 MED ORDER — ASPIRIN 81 MG PO CHEW
81.0000 mg | CHEWABLE_TABLET | Freq: Every day | ORAL | Status: DC
  Administered 2014-04-25 – 2014-04-27 (×3): 81 mg via ORAL
  Filled 2014-04-25 (×3): qty 1

## 2014-04-25 NOTE — ED Provider Notes (Signed)
Medical screening examination/treatment/procedure(s) were performed by non-physician practitioner and as supervising physician I was immediately available for consultation/collaboration.   EKG Interpretation   Date/Time:  Monday April 24 2014 12:04:04 EDT Ventricular Rate:  80 PR Interval:  162 QRS Duration: 96 QT Interval:  440 QTC Calculation: 508 R Axis:   0 Text Interpretation:  Sinus rhythm Borderline T wave abnormalities  Borderline prolonged QT interval No significant change since last tracing  Confirmed by Wilson Singer  MD, Bogard ([id number]) on 04/24/2014 2:37:40 PM       Virgel Manifold, MD 04/25/14 [phone]

## 2014-04-25 NOTE — Progress Notes (Signed)
Utilization Review Completed.Ashley Davis T8/25/2015  

## 2014-04-25 NOTE — Progress Notes (Signed)
  Echocardiogram 2D Echocardiogram has been performed.  Ashley Davis 04/25/2014, 3:03 PM

## 2014-04-25 NOTE — Evaluation (Signed)
Occupational Therapy Evaluation Patient Details Name: Ashley Davis MRN: [id number] DOB: 1966/02/08 Today's Date: 04/25/2014    History of Present Illness 48 yo female who presented with main concern of one day duration of bilateral LE weakness L>R, progressed to involve left arm weakness, associated with tingling. Pt also reports several episodes of 15 minutes long chest pains, located in the center of the chest, 5/10 in severity, occasionally but not consistently associated with left arm and leg numbness. MRI negative for acute infarct.   Clinical Impression   Pt admitted with above. Pt independent with ADLs, PTA. Feel pt will benefit from acute OT to increase independence prior to d/c. Recommending Outpatient OT for followup.     Follow Up Recommendations  Outpatient OT;Supervision - Intermittent    Equipment Recommendations  Tub/shower seat    Recommendations for Other Services       Precautions / Restrictions Precautions Precautions: Fall Restrictions Weight Bearing Restrictions: No      Mobility Bed Mobility Overal bed mobility: Modified Independent                Transfers Overall transfer level: Needs assistance   Transfers: Sit to/from Stand Sit to Stand: Min guard              Balance                                            ADL Overall ADL's : Needs assistance/impaired     Grooming: Oral care;Wash/dry face;Min guard;Standing                   Toilet Transfer: Minimal assistance;Ambulation (hand held assist; chair/bed)           Functional mobility during ADLs: Minimal assistance (hand held assist)-ambulated in hallway General ADL Comments: Educated on fine motor activities can be doing with left hand and encouraged pt to be using it for activities. Recommended pt avoiding hot surfaces/dangerous/sharp objects due to decreased sensation in LUE. Recommended spouse sit for most of LB ADLs and spoke about options  for shower chair. Educated on signs/symptoms of stroke and importance of getting help right away. Advised to stop smoking and to avoid eating a lot of sodium/canned foods.      Vision    Pt wears reading glasses  Reports blurry vision at times  Visual Fields: inconsistent in left fields   Tracking/Visual Pursuits: Able to track stimulus in all quads without difficulty             Perception     Praxis      Pertinent Vitals/Pain Pain Assessment: No/denies pain     Hand Dominance Right   Extremity/Trunk Assessment Upper Extremity Assessment Upper Extremity Assessment: LUE deficits/detail LUE Deficits / Details: 3-/5 shoulder flexion LUE Sensation: decreased light touch LUE Coordination: decreased fine motor;decreased gross motor   Lower Extremity Assessment Lower Extremity Assessment: LLE deficits/detail;Defer to PT evaluation       Communication Communication Communication: Other (comment) (trouble finding words at times)   Cognition Arousal/Alertness: Awake/alert Behavior During Therapy: WFL for tasks assessed/performed Overall Cognitive Status: Within Functional Limits for tasks assessed                     General Comments       Exercises       Shoulder Instructions  Home Living Family/patient expects to be discharged to:: Private residence Living Arrangements: Spouse/significant other;Children Available Help at Discharge: Family;Available 24 hours/day Type of Home: House Home Access: Other (comment) (small threshold)     Home Layout: Two level Alternate Level Stairs-Number of Steps: 16 Alternate Level Stairs-Rails: Left Bathroom Shower/Tub: Tub only;Walk-in shower   Bathroom Toilet: Standard                Prior Functioning/Environment Level of Independence: Independent             OT Diagnosis: Other (comment);Disturbance of vision (left hemiparesis)   OT Problem List: Impaired vision/perception;Impaired balance  (sitting and/or standing);Decreased strength;Decreased knowledge of use of DME or AE;Decreased coordination;Impaired sensation;Impaired UE functional use;Decreased knowledge of precautions   OT Treatment/Interventions: Self-care/ADL training;Therapeutic exercise;DME and/or AE instruction;Therapeutic activities;Visual/perceptual remediation/compensation;Patient/family education;Balance training    OT Goals(Current goals can be found in the care plan section) Acute Rehab OT Goals Patient Stated Goal: not stated OT Goal Formulation: With patient Time For Goal Achievement: 05/02/14 Potential to Achieve Goals: Good ADL Goals Pt Will Transfer to Toilet: with modified independence;ambulating Pt Will Perform Tub/Shower Transfer: Shower transfer;with supervision;ambulating;shower seat Additional ADL Goal #1: Pt will be independent with HEP for LUE to increase strength and coordination.   OT Frequency: Min 2X/week   Barriers to D/C:            Co-evaluation              End of Session Equipment Utilized During Treatment: Gait belt  Activity Tolerance: Patient tolerated treatment well Patient left: in chair;with call bell/phone within reach;with family/visitor present   Time: 3151-7616 OT Time Calculation (min): 31 min Charges:  OT General Charges $OT Visit: 1 Procedure OT Evaluation $Initial OT Evaluation Tier I: 1 Procedure OT Treatments $Therapeutic Activity: 8-22 mins G-CodesBenito Mccreedy OTR/L [phone] 04/25/2014, 1:48 PM

## 2014-04-25 NOTE — Evaluation (Signed)
Speech Language Pathology Evaluation Patient Details Name: Ashley Davis MRN: [id number] DOB: [birth year] Today's Date: 04/25/2014 Time: 8182-9937 SLP Time Calculation (min): 37 min  Problem List:  Patient Active Problem List   Diagnosis Date Noted  . Weakness 04/24/2014  . CVA (cerebral infarction) 04/24/2014   Past Medical History:  Past Medical History  Diagnosis Date  . Asthma    Past Surgical History:  Past Surgical History  Procedure Laterality Date  . No past surgeries     HPI:  Pt is 48 yo female who presented to [facility] ED with main concern of one day duration of bilateral LE weakness L>R, progressed to involve left arm weakness, associated with tingling. Pt also reports several episodes of 15 minutes long chest pains, located in the center of the chest, 5/10 in severity, occasionally but not consistently associated with left arm and leg numbness. Pt denies any specific alleviating factors, no similar events in the past. The patient has a significant family hx of stroke. She denies nausea, vomiting, diaphoresis. Both MRI and CT are negative for acute intracranial abnormality.   Assessment / Plan / Recommendation Clinical Impression  Pt demonstrates atypical deficits in memory and problem solving incongruent with overall cognitive function. Pt unable to accurately count from one to twenty and unable to name months of the year though otherwise higher level language and cognition functional. Consider possible psychogenic etiology. SLP to follow up with further diagnostic treatment.    SLP Assessment  Patient needs continued Speech Lanaguage Pathology Services    Follow Up Recommendations       Frequency and Duration min 2 x/week  2 weeks   Pertinent Vitals/Pain Pain Assessment: No/denies pain   SLP Goals  SLP Goals Potential to Achieve Goals: Good Potential Considerations: Medical prognosis  SLP Evaluation Prior Functioning  Type of Home: House Available Help at  Discharge: Family;Available 24 hours/day   Cognition  Overall Cognitive Status: Within Functional Limits for tasks assessed Arousal/Alertness: Awake/alert Orientation Level: Oriented X4 Attention: Focused;Sustained Focused Attention: Appears intact Sustained Attention: Appears intact Memory: Appears intact Memory Impairment:  Awareness: Appears intact Problem Solving: Impaired Problem Solving Impairment: Functional complex Executive Function: Reasoning Reasoning: Impaired Reasoning Impairment: Functional complex    Comprehension  Auditory Comprehension Overall Auditory Comprehension: Appears within functional limits for tasks assessed Yes/No Questions: Within Functional Limits Commands: Within Functional Limits Visual Recognition/Discrimination Discrimination: Not tested Reading Comprehension Reading Status: Within funtional limits    Expression Expression Primary Mode of Expression: Verbal Verbal Expression Overall Verbal Expression: Appears within functional limits for tasks assessed Initiation: No impairment Automatic Speech: Name;Social Response;Counting;Month of year Level of Generative/Spontaneous Verbalization: Conversation Repetition: No impairment Naming: No impairment Pragmatics: No impairment Written Expression Dominant Hand: Right Written Expression: Within Functional Limits   Oral / Motor Motor Speech Overall Motor Speech: Appears within functional limits for tasks assessed Respiration: Within functional limits Phonation: Normal Resonance: Within functional limits Articulation: Within functional limitis Intelligibility: Intelligible Motor Planning: Witnin functional limits   GO     Eden Emms 04/25/2014, 2:53 PM  Ashley Davis, [place] CCC-SLP [phone]

## 2014-04-25 NOTE — Progress Notes (Signed)
Patient ID: Ashley Davis, female   DOB: 01/12/1966, 48 y.o.   MRN: [id number] [facility] PROGRESS NOTE  NGINA ROYER [facility]:[id number] DOB: 07/07/1966 DOA: 04/24/2014 PCP: Philis Fendt, MD  Brief narrative: 48 yo female who presented to [facility] ED with main concern of one day duration of bilateral LE weakness L>R, progressed to involve left arm weakness, associated with tingling. Pt also reported several episodes of 15 minutes long chest pains, located in the center of the chest, 5/10 in severity, occasionally but not consistently associated with left arm and leg numbness. In ED, pt noted to be hemodynamically stable, not a tPA candidate due to onset of symptoms > 4 hours. Due to concern of stroke/TIA, transfer to [facility] recommended for further work up.   Assessment & Plan   Active Problems:  Bilateral LE weakness with left arm weakness and numbness   CT head and MRI did not show acute intracranial findings.  2 D ECHO with normal EF.  Carotid doppler pending.  Pt still reports having bilateral lower extremity weakness. Will see how she ambulates with PT.   Continue aspirin  Code Status: Full.  Family Communication:  plan of care discussed with the patient Disposition Plan: Home when stable.    IV Access:   Peripheral IV Procedures and diagnostic studies:    Dg Chest 2 View 04/24/2014  Normal chest x-ray.   Electronically Signed   By: Kalman Jewels M.D.   On: 04/24/2014 13:06   Ct Head Wo Contrast 04/24/2014  There is no acute intracranial abnormality.   Electronically Signed   By: David  Martinique   On: 04/24/2014 13:41   Mr Brain Wo Contrast 04/25/2014    1.  Normal non contrast MRI appearance of the brain. 2.  Negative intracranial MRA.     Mr Jodene Nam Head/brain Wo Cm 04/25/2014 1.  Normal non contrast MRI appearance of the brain. 2.  Negative intracranial MRA.    Medical Consultants:   None  Other Consultants:   None  Anti-Infectives:   None    Leisa Lenz,  MD  [facility] Pager [phone]  If 7PM-7AM, please contact night-coverage [email or web address] Password [facility] 04/25/2014, 6:53 PM   LOS: 1 day    HPI/Subjective: No acute overnight events.  Objective: Filed Vitals:   04/25/14 0700 04/25/14 1018 04/25/14 1432 04/25/14 1837  BP: 125/67 114/63 133/71 155/73  Pulse: 85 77 85 88  Temp: 98.8 F (37.1 C) 98.4 F (36.9 C) 98.9 F (37.2 C) 98.8 F (37.1 C)  TempSrc: Oral Oral Oral Oral  Resp: 18 18 18 18   SpO2: 99% 99% 97% 99%    Intake/Output Summary (Last 24 hours) at 04/25/14 1853 Last data filed at 04/25/14 1500  Gross per 24 hour  Intake    360 ml  Output      0 ml  Net    360 ml    Exam:   General:  Pt is alert, follows commands appropriately, not in acute distress  Cardiovascular: Regular rate and rhythm, S1/S2, no murmurs  Respiratory: Clear to auscultation bilaterally, no wheezing, no crackles, no rhonchi  Abdomen: Soft, non tender, non distended, bowel sounds present  Extremities: pulses DP and PT palpable bilaterally  Neuro: Grossly nonfocal  Data Reviewed: Basic Metabolic Panel:  Recent Labs Lab 04/24/14 1219 04/25/14 0610  NA 139 139  K 4.3 3.7  CL 106 105  CO2 21 22  GLUCOSE 101* 106*  BUN 10 11  CREATININE 0.57 0.58  CALCIUM  9.0 8.4   Liver Function Tests: No results found for this basename: AST, ALT, ALKPHOS, BILITOT, PROT, ALBUMIN,  in the last 168 hours No results found for this basename: LIPASE, AMYLASE,  in the last 168 hours No results found for this basename: AMMONIA,  in the last 168 hours CBC:  Recent Labs Lab 04/24/14 1219 04/25/14 0610  WBC 5.0 4.9  HGB 12.0 11.2*  HCT 34.9* 32.7*  MCV 86.4 86.7  PLT 264 241   Cardiac Enzymes: No results found for this basename: CKTOTAL, CKMB, CKMBINDEX, TROPONINI,  in the last 168 hours BNP: No components found with this basename: POCBNP,  CBG: No results found for this basename: GLUCAP,  in the last 168 hours  No results  found for this or any previous visit (from the past 240 hour(s)).   Scheduled Meds: . LORazepam  1 mg Intravenous Once   Continuous Infusions:

## 2014-04-25 NOTE — Progress Notes (Signed)
PT Cancellation Note  Patient Details Name: Ashley Davis MRN: [id number] DOB: Aug 29, 1966   Cancelled Treatment:    Reason Eval/Treat Not Completed: Patient at procedure or test/unavailable. Pt leaving floor for MRI. Pt also on bedrest until 14:40 today. PT to return as able.   Kingsley Callander 04/25/2014, 11:02 AM  Kittie Plater, PT, DPT Pager #: [phone] Office #: [phone]

## 2014-04-26 DIAGNOSIS — G8190 Hemiplegia, unspecified affecting unspecified side: Secondary | ICD-10-CM

## 2014-04-26 MED ORDER — LORAZEPAM 2 MG/ML IJ SOLN
1.0000 mg | Freq: Once | INTRAMUSCULAR | Status: DC
  Filled 2014-04-26: qty 1

## 2014-04-26 NOTE — Consult Note (Signed)
NEURO HOSPITALIST CONSULT NOTE    Reason for Consult: left sided weakness in setting of normal brain MRI  HPI:                                                                                                                                          Ashley Davis is an 48 y.o. female  Who states on Saturday she noted her left leg was unusually heavy after getting up from a seated position.  She believed it was due to sitting for prolonged period and did not seek medical attention. Symptoms resolved by the end of the night. The next day she again had left leg heaviness but this time her left arm also felt heavy and she noted chest pain. These symptoms again eventually subsided.  On Monday symptoms returned along with chest pain which prompted her to come to hospital.  She does have history of migraine HA which at times can cause bilateral leg tingling but states her HA now is not typical for her HA's.  Patient was admitted for stroke work up.  MRI was obtained which was negative, however patient continues to feel her left leg and arm feel heavy.  She also complains of left arm, torso and leg decreased sensation. Neurology was asked to evaluate.   Past Medical History  Diagnosis Date  . Asthma     Past Surgical History  Procedure Laterality Date  . No past surgeries      Family History  Problem Relation Age of Onset  . Diabetes Mother   . Stroke Mother   . Cancer Mother   . Diabetes Father     Social History:  reports that she quit smoking about 6 years ago. She has never used smokeless tobacco. She reports that she drinks alcohol. She reports that she does not use illicit drugs.  Allergies  Allergen Reactions  . Shellfish Allergy Hives    MEDICATIONS:                                                                                                                     Prior to Admission:  Prescriptions prior to admission  Medication Sig Dispense Refill  .  albuterol (PROVENTIL HFA;VENTOLIN HFA) 108 (90 BASE) MCG/ACT inhaler Inhale 2 puffs  into the lungs every 6 (six) hours as needed for wheezing.      Marland Kitchen aspirin-acetaminophen-caffeine (EXCEDRIN MIGRAINE) 250-250-65 MG per tablet Take 1 tablet by mouth once as needed for migraine.       . diphenhydrAMINE (BENADRYL) 25 MG tablet Take 25 mg by mouth daily as needed for allergies.      . naproxen sodium (ANAPROX) 220 MG tablet Take 660 mg by mouth once as needed (pain.).       Scheduled: . aspirin  81 mg Oral Daily  . LORazepam  1 mg Intravenous Once     ROS:                                                                                                                                       History obtained from the patient  General ROS: negative for - chills, fatigue, fever, night sweats, weight gain or weight loss Psychological ROS: negative for - behavioral disorder, hallucinations, memory difficulties, mood swings or suicidal ideation Ophthalmic ROS: negative for - blurry vision, double vision, eye pain or loss of vision ENT ROS: negative for - epistaxis, nasal discharge, oral lesions, sore throat, tinnitus or vertigo Allergy and Immunology ROS: negative for - hives or itchy/watery eyes Hematological and Lymphatic ROS: negative for - bleeding problems, bruising or swollen lymph nodes Endocrine ROS: negative for - galactorrhea, hair pattern changes, polydipsia/polyuria or temperature intolerance Respiratory ROS: negative for - cough, hemoptysis, shortness of breath or wheezing Cardiovascular ROS: negative for - chest pain, dyspnea on exertion, edema or irregular heartbeat Gastrointestinal ROS: negative for - abdominal pain, diarrhea, hematemesis, nausea/vomiting or stool incontinence Genito-Urinary ROS: negative for - dysuria, hematuria, incontinence or urinary frequency/urgency Musculoskeletal ROS: negative for - joint swelling or muscular weakness Neurological ROS: as noted in  HPI Dermatological ROS: negative for rash and skin lesion changes   Blood pressure 157/103, pulse 71, temperature 98.5 F (36.9 C), temperature source Oral, resp. rate 18, last menstrual period 03/27/2014, SpO2 96.00%.   Neurologic Examination:                                                                                                      General: NAD Mental Status: Alert, oriented, thought content appropriate.  Speech fluent without evidence of aphasia.  Able to follow 3 step commands without difficulty. Cranial Nerves: II: Discs flat bilaterally; Visual fields grossly normal, pupils equal, round, reactive to light and accommodation III,IV, VI: ptosis not present, extra-ocular motions intact bilaterally V,VII:  smile symmetric, facial light touch sensation normal bilaterally, slight right NL fold decrease VIII: hearing normal bilaterally IX,X: gag reflex present XI: bilateral shoulder shrug XII: midline tongue extension without atrophy or fasciculations  Motor: Right : Upper extremity   4/5    Left:     Upper extremity   4/5 (pain in left shoulder)  Lower extremity   5/5     Lower extremity   5/5 (pain in left quadricept) Tone and bulk:normal tone throughout; no atrophy noted Sensory: splits midline to LT down torso and then splits midline to light touch down left leg.  Deep Tendon Reflexes:  Right: Upper Extremity   Left: Upper extremity   biceps (C-5 to C-6) 2/4   biceps (C-5 to C-6) 2/4 tricep (C7) 2/4    triceps (C7) 2/4 Brachioradialis (C6) 2/4  Brachioradialis (C6) 2/4  Lower Extremity Lower Extremity  quadriceps (L-2 to L-4) 2/4   quadriceps (L-2 to L-4) 2/4 Achilles (S1) 2/4   Achilles (S1) 2/4  Plantars: Right: downgoing   Left: downgoing Cerebellar: normal finger-to-nose,  normal heel-to-shin test Gait: not tested due to weakness.  CV: pulses palpable throughout    Lab Results: Basic Metabolic Panel:  Recent Labs Lab 04/24/14 1219 04/25/14 0610  NA  139 139  K 4.3 3.7  CL 106 105  CO2 21 22  GLUCOSE 101* 106*  BUN 10 11  CREATININE 0.57 0.58  CALCIUM 9.0 8.4    Liver Function Tests: No results found for this basename: AST, ALT, ALKPHOS, BILITOT, PROT, ALBUMIN,  in the last 168 hours No results found for this basename: LIPASE, AMYLASE,  in the last 168 hours No results found for this basename: AMMONIA,  in the last 168 hours  CBC:  Recent Labs Lab 04/24/14 1219 04/25/14 0610  WBC 5.0 4.9  HGB 12.0 11.2*  HCT 34.9* 32.7*  MCV 86.4 86.7  PLT 264 241    Cardiac Enzymes: No results found for this basename: CKTOTAL, CKMB, CKMBINDEX, TROPONINI,  in the last 168 hours  Lipid Panel:  Recent Labs Lab 04/25/14 0610  CHOL 152  TRIG 57  HDL 28*  CHOLHDL 5.4  VLDL 11  LDLCALC 113*    CBG: No results found for this basename: GLUCAP,  in the last 168 hours  Microbiology: No results found for this or any previous visit.  Coagulation Studies: No results found for this basename: LABPROT, INR,  in the last 72 hours  Imaging: Ct Head Wo Contrast  04/24/2014   CLINICAL DATA:  One day history of left-sided weakness  EXAM: CT HEAD WITHOUT CONTRAST  TECHNIQUE: Contiguous axial images were obtained from the base of the skull through the vertex without intravenous contrast.  COMPARISON:  None.  FINDINGS: The ventricles are normal in size and position. There is no intracranial hemorrhage nor intracranial mass effect. No acute ischemic changes are demonstrated. There are no abnormal intracranial calcifications. The cerebellum and brainstem are normal.  The observed paranasal sinuses and mastoid air cells are clear. There is no acute skull fracture.  IMPRESSION: There is no acute intracranial abnormality.   Electronically Signed   By: David  Martinique   On: 04/24/2014 13:41   Mr Brain Wo Contrast  04/25/2014   CLINICAL DATA:  48 year old female who presented with 1 day duration of bilateral lower extremity weakness left greater than  right, with progressive weakness subsequently involving the left upper extremity. Initial encounter. Possible stroke.  EXAM: MRI HEAD WITHOUT CONTRAST  MRA HEAD WITHOUT  CONTRAST  TECHNIQUE: Multiplanar, multiecho pulse sequences of the brain and surrounding structures were obtained without intravenous contrast. Angiographic images of the head were obtained using MRA technique without contrast.  COMPARISON:  Head CT without contrast 04/24/2014.  FINDINGS: MRI HEAD FINDINGS  Cerebral volume is normal. No restricted diffusion to suggest acute infarction. No midline shift, mass effect, evidence of mass lesion, ventriculomegaly, extra-axial collection or acute intracranial hemorrhage. Cervicomedullary junction and pituitary are within normal limits. Negative visualized cervical spine. Major intracranial vascular flow voids appear normal. Pearline Cables and white matter signal is within normal limits throughout the brain.  Visible internal auditory structures appear normal. Visualized paranasal sinuses and mastoids are clear. Visualized orbit soft tissues are within normal limits. Visualized scalp soft tissues are within normal limits.  Hyperostosis of the calvarium. Visualized bone marrow signal is within normal limits.  MRA HEAD FINDINGS  Antegrade flow in the posterior circulation with codominant distal vertebral arteries. Normal vertebrobasilar junction. Normal PICA origins. Normal basilar artery. Normal SCA and PCA origins. Posterior communicating arteries are diminutive or absent. Normal bilateral PCA branches.  Antegrade flow in both ICA siphons. Mild tortuosity of the distal cervical ICAs. No siphon stenosis. Ophthalmic artery origins are within normal limits. Normal carotid termini. Normal MCA and ACA origins. Anterior communicating artery and visualized bilateral ACA branches are within normal limits, suspect a small median artery of the corpus callosum. Visualized bilateral MCA branches are within normal limits.   IMPRESSION: 1.  Normal non contrast MRI appearance of the brain. 2.  Negative intracranial MRA.   Electronically Signed   By: Lars Pinks M.D.   On: 04/25/2014 12:59   Mr Jodene Nam Head/brain Wo Cm  04/25/2014   CLINICAL DATA:  48 year old female who presented with 1 day duration of bilateral lower extremity weakness left greater than right, with progressive weakness subsequently involving the left upper extremity. Initial encounter. Possible stroke.  EXAM: MRI HEAD WITHOUT CONTRAST  MRA HEAD WITHOUT CONTRAST  TECHNIQUE: Multiplanar, multiecho pulse sequences of the brain and surrounding structures were obtained without intravenous contrast. Angiographic images of the head were obtained using MRA technique without contrast.  COMPARISON:  Head CT without contrast 04/24/2014.  FINDINGS: MRI HEAD FINDINGS  Cerebral volume is normal. No restricted diffusion to suggest acute infarction. No midline shift, mass effect, evidence of mass lesion, ventriculomegaly, extra-axial collection or acute intracranial hemorrhage. Cervicomedullary junction and pituitary are within normal limits. Negative visualized cervical spine. Major intracranial vascular flow voids appear normal. Pearline Cables and white matter signal is within normal limits throughout the brain.  Visible internal auditory structures appear normal. Visualized paranasal sinuses and mastoids are clear. Visualized orbit soft tissues are within normal limits. Visualized scalp soft tissues are within normal limits.  Hyperostosis of the calvarium. Visualized bone marrow signal is within normal limits.  MRA HEAD FINDINGS  Antegrade flow in the posterior circulation with codominant distal vertebral arteries. Normal vertebrobasilar junction. Normal PICA origins. Normal basilar artery. Normal SCA and PCA origins. Posterior communicating arteries are diminutive or absent. Normal bilateral PCA branches.  Antegrade flow in both ICA siphons. Mild tortuosity of the distal cervical ICAs. No  siphon stenosis. Ophthalmic artery origins are within normal limits. Normal carotid termini. Normal MCA and ACA origins. Anterior communicating artery and visualized bilateral ACA branches are within normal limits, suspect a small median artery of the corpus callosum. Visualized bilateral MCA branches are within normal limits.  IMPRESSION: 1.  Normal non contrast MRI appearance of the brain. 2.  Negative intracranial MRA.  Electronically Signed   By: Lars Pinks M.D.   On: 04/25/2014 12:59    Vascular Ultrasound  Carotid Duplex (Doppler) :Preliminary findings: Bilateral: 1-39% ICA stenosis. Vertebral artery flow is antegrade.    2 D echo Study Conclusions  - Left ventricle: The cavity size was normal. Systolic function was normal. The estimated ejection fraction was in the range of 55% to 60%. Wall motion was normal; there were no regional wall motion abnormalities. - Atrial septum: No defect or patent foramen ovale was identified.   A1c 5.2 LDL Shiloh PA-C [facility] [phone]  04/26/2014, 1:14 PM   Patient seen and examined.  Clinical course and management discussed.  Necessary edits performed.  I agree with the above.  Assessment and plan of care developed and discussed below.    Assessment/Plan:  48 YO female with intermittent left leg and arm weakness and decreased sensation. Initial MRI/MRA head is negative for intracranial pathology or stenosis. Neurological examination shows preserved strength (slightly limited by pain) and inconsistent/functional sensory findings.  Imaging unremarkable.  Symptoms may be related to her headache and history of migraines, i.e. can not rule out a migraine variant.  Will also rule out cervical pathology.    Recommendations: 1.  MRI of the cervical spine without contrast.  2.  Continue therapy.     Alexis Goodell, MD [facility] [phone]  04/26/2014  2:56 PM

## 2014-04-26 NOTE — Progress Notes (Signed)
Occupational Therapy Treatment Patient Details Name: LYNCOLN MASKELL MRN: [id number] DOB: [birth year] Today's Date: 04/26/2014    History of present illness 48 yo female who presented with main concern of one day duration of bilateral LE weakness L>R, progressed to involve left arm weakness, associated with tingling. Pt also reports several episodes of 15 minutes long chest pains, located in the center of the chest, 5/10 in severity, occasionally but not consistently associated with left arm and leg numbness. MRI negative for acute infarct.   OT comments  Pt seen today for therapeutic exercise of LUE to increase strength and fine motor coordination. Pt issued theraputty, soft, yellow and HEP with fine motor activities. Pt returned demo of use of theraputty. Education provided to pt regarding use of LUE in functional activities, stroke signs and symptoms using FAST, and healthy lifestyle choices. Pt would continue to benefit from skilled OT for LUE strengthening and dexterity.    Follow Up Recommendations  Outpatient OT;Supervision - Intermittent    Equipment Recommendations  Tub/shower seat    Recommendations for Other Services      Precautions / Restrictions Precautions Precautions: Fall Restrictions Weight Bearing Restrictions: No       Mobility Bed Mobility Overal bed mobility: Modified Independent                               ADL Overall ADL's : Needs assistance/impaired                                       General ADL Comments: Educated pt on use of LUE in functional tasks to increase fine motor coordination and strength. Provided pt with handout for fine motor coordination activities as well as theraputty HEP and issued pt with soft (yellow) theraputty. Pt returned demonstration of use of theraputty for hand strengthening. Pt provided teachback of information previously addressed including avoiding hot/sharp surfaces and objects due to  decreased sensation. Reinforced stroke education using FAST.      Vision                 Additional Comments: Vision assessed within functional context and no abnormalities noted. Room was dark and would like OT to continue to address vision in functional context.          Cognition  Arousal/Alertness: Awake/Alert Behavior During Therapy: WFL for tasks assessed/performed Overall Cognitive Status: Within Functional Limits for tasks assessed                                    Pertinent Vitals/ Pain       Pain Assessment: No/denies pain         Frequency Min 2X/week     Progress Toward Goals  OT Goals(current goals can now be found in the care plan section)  Progress towards OT goals: Progressing toward goals     Plan Discharge plan remains appropriate       End of Session Equipment Utilized During Treatment: Other (comment) (theraputty soft (yellow), HEP)   Activity Tolerance Patient tolerated treatment well   Patient Left in bed;with call bell/phone within reach   Nurse Communication          Time: 7169-6789 OT Time Calculation (min): 16 min  Charges: OT General Charges $OT Visit: 1  Procedure OT Treatments $Therapeutic Exercise: 8-22 mins  Juluis Rainier [phone] 04/26/2014, 5:54 PM

## 2014-04-26 NOTE — Progress Notes (Signed)
[facility] PROGRESS NOTE  LEISL SPURRIER YWV:[id number] DOB: 1966/05/08 DOA: 04/24/2014 PCP: Philis Fendt, MD  Assessment/Plan: #1 left-sided weakness Questionable etiology. May be stress-induced. CT head MRI MRA head negative for any acute abnormalities. 2-D echo with no source of emboli. Carotid Dopplers with no significant ICA stenosis. PT/OT. Consulted with neurology and I recommended MRI of the C-spine to rule out cervical pathology. Follow.  #2 weakness Secondary to problem #1. PT/OT.  #3 history of asthma Stable.  Code Status: Full Family Communication: Updated patient and husband at bedside. Disposition Plan: Home when medically stable.   Consultants:  Neurology: Dr. Doy Mince 04/26/2014  Procedures:  CT head 04/24/2014  MRI/MRA head 04/25/2014  2-D echo 04/25/2014  Chest x-ray 04/24/2014  Carotid Dopplers 04/26/2014  Antibiotics:  None  HPI/Subjective: Patient states no significant change in left sided weakness.  Objective: Filed Vitals:   04/26/14 1139  BP: 157/103  Pulse:   Temp: 98.5 F (36.9 C)  Resp: 18    Intake/Output Summary (Last 24 hours) at 04/26/14 1749 Last data filed at 04/25/14 1900  Gross per 24 hour  Intake    240 ml  Output      0 ml  Net    240 ml   There were no vitals filed for this visit.  Exam:   General:  NAD  Cardiovascular: RRR  Respiratory: CTAB  Abdomen: Soft/NT/ND/+BS  Musculoskeletal: Patient with some left sided weakness  Data Reviewed: Basic Metabolic Panel:  Recent Labs Lab 04/24/14 1219 04/25/14 0610  NA 139 139  K 4.3 3.7  CL 106 105  CO2 21 22  GLUCOSE 101* 106*  BUN 10 11  CREATININE 0.57 0.58  CALCIUM 9.0 8.4   Liver Function Tests: No results found for this basename: AST, ALT, ALKPHOS, BILITOT, PROT, ALBUMIN,  in the last 168 hours No results found for this basename: LIPASE, AMYLASE,  in the last 168 hours No results found for this basename: AMMONIA,  in the last  168 hours CBC:  Recent Labs Lab 04/24/14 1219 04/25/14 0610  WBC 5.0 4.9  HGB 12.0 11.2*  HCT 34.9* 32.7*  MCV 86.4 86.7  PLT 264 241   Cardiac Enzymes: No results found for this basename: CKTOTAL, CKMB, CKMBINDEX, TROPONINI,  in the last 168 hours BNP (last 3 results) No results found for this basename: PROBNP,  in the last 8760 hours CBG: No results found for this basename: GLUCAP,  in the last 168 hours  No results found for this or any previous visit (from the past 240 hour(s)).   Studies: Mr Herby Abraham Contrast  04/25/2014   CLINICAL DATA:  48 year old female who presented with 1 day duration of bilateral lower extremity weakness left greater than right, with progressive weakness subsequently involving the left upper extremity. Initial encounter. Possible stroke.  EXAM: MRI HEAD WITHOUT CONTRAST  MRA HEAD WITHOUT CONTRAST  TECHNIQUE: Multiplanar, multiecho pulse sequences of the brain and surrounding structures were obtained without intravenous contrast. Angiographic images of the head were obtained using MRA technique without contrast.  COMPARISON:  Head CT without contrast 04/24/2014.  FINDINGS: MRI HEAD FINDINGS  Cerebral volume is normal. No restricted diffusion to suggest acute infarction. No midline shift, mass effect, evidence of mass lesion, ventriculomegaly, extra-axial collection or acute intracranial hemorrhage. Cervicomedullary junction and pituitary are within normal limits. Negative visualized cervical spine. Major intracranial vascular flow voids appear normal. Pearline Cables and white matter signal is within normal limits throughout the brain.  Visible internal auditory structures  appear normal. Visualized paranasal sinuses and mastoids are clear. Visualized orbit soft tissues are within normal limits. Visualized scalp soft tissues are within normal limits.  Hyperostosis of the calvarium. Visualized bone marrow signal is within normal limits.  MRA HEAD FINDINGS  Antegrade flow in the  posterior circulation with codominant distal vertebral arteries. Normal vertebrobasilar junction. Normal PICA origins. Normal basilar artery. Normal SCA and PCA origins. Posterior communicating arteries are diminutive or absent. Normal bilateral PCA branches.  Antegrade flow in both ICA siphons. Mild tortuosity of the distal cervical ICAs. No siphon stenosis. Ophthalmic artery origins are within normal limits. Normal carotid termini. Normal MCA and ACA origins. Anterior communicating artery and visualized bilateral ACA branches are within normal limits, suspect a small median artery of the corpus callosum. Visualized bilateral MCA branches are within normal limits.  IMPRESSION: 1.  Normal non contrast MRI appearance of the brain. 2.  Negative intracranial MRA.   Electronically Signed   By: Lars Pinks M.D.   On: 04/25/2014 12:59   Mr Jodene Nam Head/brain Wo Cm  04/25/2014   CLINICAL DATA:  48 year old female who presented with 1 day duration of bilateral lower extremity weakness left greater than right, with progressive weakness subsequently involving the left upper extremity. Initial encounter. Possible stroke.  EXAM: MRI HEAD WITHOUT CONTRAST  MRA HEAD WITHOUT CONTRAST  TECHNIQUE: Multiplanar, multiecho pulse sequences of the brain and surrounding structures were obtained without intravenous contrast. Angiographic images of the head were obtained using MRA technique without contrast.  COMPARISON:  Head CT without contrast 04/24/2014.  FINDINGS: MRI HEAD FINDINGS  Cerebral volume is normal. No restricted diffusion to suggest acute infarction. No midline shift, mass effect, evidence of mass lesion, ventriculomegaly, extra-axial collection or acute intracranial hemorrhage. Cervicomedullary junction and pituitary are within normal limits. Negative visualized cervical spine. Major intracranial vascular flow voids appear normal. Pearline Cables and white matter signal is within normal limits throughout the brain.  Visible internal  auditory structures appear normal. Visualized paranasal sinuses and mastoids are clear. Visualized orbit soft tissues are within normal limits. Visualized scalp soft tissues are within normal limits.  Hyperostosis of the calvarium. Visualized bone marrow signal is within normal limits.  MRA HEAD FINDINGS  Antegrade flow in the posterior circulation with codominant distal vertebral arteries. Normal vertebrobasilar junction. Normal PICA origins. Normal basilar artery. Normal SCA and PCA origins. Posterior communicating arteries are diminutive or absent. Normal bilateral PCA branches.  Antegrade flow in both ICA siphons. Mild tortuosity of the distal cervical ICAs. No siphon stenosis. Ophthalmic artery origins are within normal limits. Normal carotid termini. Normal MCA and ACA origins. Anterior communicating artery and visualized bilateral ACA branches are within normal limits, suspect a small median artery of the corpus callosum. Visualized bilateral MCA branches are within normal limits.  IMPRESSION: 1.  Normal non contrast MRI appearance of the brain. 2.  Negative intracranial MRA.   Electronically Signed   By: Lars Pinks M.D.   On: 04/25/2014 12:59    Scheduled Meds: . aspirin  81 mg Oral Daily  . LORazepam  1 mg Intravenous Once   Continuous Infusions:   Principal Problem:   Hemiplegia, unspecified, affecting nondominant side Active Problems:   Weakness   CVA (cerebral infarction)    Time spent: [place] MD [facility] Pager [phone]. If 7PM-7AM, please contact night-coverage at [email or web address], password [facility] 04/26/2014, 5:49 PM  LOS: 2 days

## 2014-04-26 NOTE — Care Management Note (Addendum)
  Page 1 of 1   04/26/2014     4:10:24 PM CARE MANAGEMENT NOTE 04/26/2014  Patient:  Ashley Davis, Ashley Davis   Account Number:  [id number]  Date Initiated:  04/26/2014  Documentation initiated by:  Lorne Skeens  Subjective/Objective Assessment:   Patient was admitted with left sided weakness. Lives at home with spouse     Action/Plan:   Will follow for discharge needs pending PT/OT evals and physician orders.   Anticipated DC Date:  04/27/2014   Anticipated DC Plan:  HOME/SELF CARE      DC Planning Services  CM consult  OP Neuro Rehab      Choice offered to / List presented to:     DME arranged  [facility]  [facility]           Status of service:   Medicare Important Message given?   (If response is "NO", the following Medicare IM given date fields will be blank) Date Medicare IM given:   Medicare IM given by:   Date Additional Medicare IM given:   Additional Medicare IM given by:    Discharge Disposition:    Per UR Regulation:    If discussed at Long Length of Stay Meetings, dates discussed:    Comments:  04/26/14 [place] RN, MSN, CM- Met with patient to discuss discharge needs. Patient is agreeable to outpatient PT/OT and has chosen outpatient neuro rehab. Orders were faxed.  DME orders were placed for probable discharge home tomorrow.

## 2014-04-26 NOTE — Evaluation (Signed)
Physical Therapy Evaluation Patient Details Name: LANDRY LOOKINGBILL MRN: [id number] DOB: 1966-04-15 Today's Date: 04/26/2014   History of Present Illness  48 yo female who presented with main concern of one day duration of bilateral LE weakness L>R, progressed to involve left arm weakness, associated with tingling. Pt also reports several episodes of 15 minutes long chest pains, located in the center of the chest, 5/10 in severity, occasionally but not consistently associated with left arm and leg numbness. MRI negative for acute infarct.  Clinical Impression  Pt with noted L LE>UE weakness. L LE remains to feel "heavy". Pt requires use of RW for safe long distance ambulation. Pt to strongly benefit from out pt PT to address L sided weakness and balance impairment to achieve safe mod I function and pt desire to amb without AD. Pt was working full time PTA.    Follow Up Recommendations Outpatient PT;Supervision/Assistance - 24 hour    Equipment Recommendations  Rolling walker with 5" wheels    Recommendations for Other Services       Precautions / Restrictions Precautions Precautions: Fall Restrictions Weight Bearing Restrictions: No      Mobility  Bed Mobility Overal bed mobility: Modified Independent                Transfers Overall transfer level: Needs assistance Equipment used: None Transfers: Sit to/from Stand Sit to Stand: Min guard         General transfer comment: v/c's for safe hand placement when using RW  Ambulation/Gait Ambulation/Gait assistance: Min guard Ambulation Distance (Feet): 75 Feet (x2) Assistive device: Rolling walker (2 wheeled) Gait Pattern/deviations: Step-through pattern   Gait velocity interpretation: Below normal speed for age/gender General Gait Details: Pt reports ambulating to bathroom in room with AD however reports "furniture walking" when using RW pt with significant bilat UE WBing. Pt tearful at thought of using RW however  encouraged her this should be temporary and that with therapy she should minimally advance to a cane.. Pt did require rest break s/p 75 feet.  Stairs Stairs:  (discussed going up sideways leading with R LE while holding onto rail with both hands)          Wheelchair Mobility    Modified Rankin (Stroke Patients Only)       Balance Overall balance assessment: Needs assistance Sitting-balance support: Feet supported Sitting balance-Leahy Scale: Good     Standing balance support: No upper extremity supported Standing balance-Leahy Scale: Fair Standing balance comment: pt able to stand without assist but requires RW for safe amb                             Pertinent Vitals/Pain Pain Assessment: No/denies pain    Home Living Family/patient expects to be discharged to:: Private residence Living Arrangements: Spouse/significant other;Children Available Help at Discharge: Family;Available 24 hours/day Type of Home: House Home Access: Other (comment) (small threshold)     Home Layout: Two level Home Equipment: None      Prior Function Level of Independence: Independent               Hand Dominance   Dominant Hand: Right    Extremity/Trunk Assessment   Upper Extremity Assessment: LUE deficits/detail       LUE Deficits / Details: 3-/5 shoulder flexion   Lower Extremity Assessment: LLE deficits/detail   LLE Deficits / Details: pt c/o "it feels heavy", grossly 3-/5,     Communication  Cognition Arousal/Alertness: Awake/alert Behavior During Therapy: WFL for tasks assessed/performed Overall Cognitive Status: Within Functional Limits for tasks assessed                      General Comments      Exercises        Assessment/Plan    PT Assessment Patient needs continued PT services  PT Diagnosis Difficulty walking   PT Problem List Decreased strength;Decreased balance;Decreased mobility;Decreased activity tolerance;Impaired  sensation  PT Treatment Interventions DME instruction;Gait training;Stair training;Functional mobility training;Therapeutic activities;Therapeutic exercise   PT Goals (Current goals can be found in the Care Plan section) Acute Rehab PT Goals Patient Stated Goal: not stated PT Goal Formulation: With patient/family Time For Goal Achievement: 05/03/14 Potential to Achieve Goals: Good    Frequency Min 4X/week   Barriers to discharge        Co-evaluation               End of Session Equipment Utilized During Treatment: Gait belt Activity Tolerance: Patient tolerated treatment well Patient left: with call bell/phone within reach;in bed;with family/visitor present Nurse Communication: Mobility status         Time: 1005-1031 PT Time Calculation (min): 26 min   Charges:   PT Evaluation $Initial PT Evaluation Tier I: 1 Procedure PT Treatments $Gait Training: 8-22 mins   PT G CodesKingsley Callander 04/26/2014, 10:50 AM  Kittie Plater, PT, DPT Pager #: [phone] Office #: [phone]

## 2014-04-26 NOTE — Progress Notes (Signed)
*  PRELIMINARY RESULTS* Vascular Ultrasound Carotid Duplex (Doppler) has been completed.  Preliminary findings: Bilateral:  1-39% ICA stenosis.  Vertebral artery flow is antegrade.      Landry Mellow, RDMS, RVT  04/26/2014, 1:37 PM

## 2014-04-27 ENCOUNTER — Inpatient Hospital Stay (HOSPITAL_COMMUNITY): Payer: BC Managed Care – PPO

## 2014-04-27 DIAGNOSIS — G43109 Migraine with aura, not intractable, without status migrainosus: Principal | ICD-10-CM

## 2014-04-27 DIAGNOSIS — J45909 Unspecified asthma, uncomplicated: Secondary | ICD-10-CM | POA: Diagnosis present

## 2014-04-27 LAB — BASIC METABOLIC PANEL
Anion gap: 14 (ref 5–15)
BUN: 7 mg/dL (ref 6–23)
CO2: 22 mEq/L (ref 19–32)
CREATININE: 0.52 mg/dL (ref 0.50–1.10)
Calcium: 8.8 mg/dL (ref 8.4–10.5)
Chloride: 104 mEq/L (ref 96–112)
GFR calc non Af Amer: >90 mL/min (ref >90)
Glucose, Bld: 108 mg/dL — ABNORMAL HIGH (ref 70–99)
Potassium: 3.4 mEq/L — ABNORMAL LOW (ref 3.7–5.3)
Sodium: 140 mEq/L (ref 137–147)

## 2014-04-27 MED ORDER — GADOBENATE DIMEGLUMINE 529 MG/ML IV SOLN: 20.0000 mL | Freq: Once | INTRAVENOUS | Status: DC | PRN

## 2014-04-27 NOTE — Discharge Summary (Signed)
Physician Discharge Summary  Ashley Davis HQI:[id number] DOB: 08-07-1966 DOA: 04/24/2014  PCP: Philis Fendt, MD  Admit date: 04/24/2014 Discharge date: 04/27/2014  Time spent: 65 minutes  Recommendations for Outpatient Follow-up:  1. Followup with AVBUERE,EDWIN A, MD in 1 week.  Discharge Diagnoses:  Principal Problem:   Complicated migraine Active Problems:   Hemiplegia, unspecified, affecting nondominant side   Weakness   Asthma, chronic   Discharge Condition: Stable and improved  Diet recommendation: Regular  There were no vitals filed for this visit.  History of present illness:  Ashley Davis is an 48 y.o. female who had presented to the emergency room with her left leg was unusually heavy after getting up from a seated position 2 days prior to admission. She believed it was due to sitting for prolonged period and did not seek medical attention. Symptoms resolved by the end of the night. The next day she again had left leg heaviness but this time her left arm also felt heavy and she noted chest pain. These symptoms again eventually subsided. On Monday, the day of admission, symptoms returned along with chest pain which prompted her to come to hospital. She did have history of migraine HA which at times can cause bilateral leg tingling but states her HA now is not typical for her HA's. Patient was admitted for stroke work up.    Hospital Course:  #1 complicated migraine/intermittent left-sided hemiparesis/weakness and decreased sensation Patient was admitted to the hospital with some left-sided weakness and heaviness as well as some decreased sensation. Stroke workup was undertaken MRI/MRA of the head which was done was negative for any intracranial pathology or stenosis. Carotid Dopplers which were done were negative for any significant ICA stenosis. 2-D echo which was done showed a normal systolic function with 80 of a 55-60% with no wall motion abnormalities and no  source of emboli. Patient was placed on aspirin. Due to continued symptoms and negative workup in neurology consultation was obtained the patient was seen in consultation by Dr. Doy Mince. MRI of the C-spine was obtained which showed some degenerative cervical spondylosis with multilevel disc disease and facet disease however not significant enough to be the source of patient's symptoms. Patient was seen by physical therapy. Patient improved clinically. Patient be discharged home with outpatient PT and OT. [facility] is to followup with PCP as outpatient.  #2 asthma Remained stable throughout the hospitalization.  Procedures: CT head 04/24/2014  MRI/MRA head 04/25/2014  2-D echo 04/25/2014  Chest x-ray 04/24/2014  Carotid Dopplers 04/26/2014 MRI of the C-spine 04/27/2014     Consultations:  Neurology: Dr. Doy Mince 04/26/2014  Discharge Exam: Filed Vitals:   04/27/14 1335  BP: 131/84  Pulse: 80  Temp: 98.7 F (37.1 C)  Resp: 18    General: NAD Cardiovascular: RRR Respiratory: CTAB  Discharge Instructions You were cared for by a hospitalist during your hospital stay. If you have any questions about your discharge medications or the care you received while you were in the hospital after you are discharged, you can call the unit and asked to speak with the hospitalist on call if the hospitalist that took care of you is not available. Once you are discharged, your primary care physician will handle any further medical issues. Please note that NO REFILLS for any discharge medications will be authorized once you are discharged, as it is imperative that you return to your primary care physician (or establish a relationship with a primary care physician if you do not  have one) for your aftercare needs so that they can reassess your need for medications and monitor your lab values.      Discharge Instructions   Diet general    Complete by:  As directed      Discharge instructions     Complete by:  As directed   Follow up with AVBUERE,EDWIN A, MD in 1-2 weeks.     Increase activity slowly    Complete by:  As directed             Medication List         albuterol 108 (90 BASE) MCG/ACT inhaler  Commonly known as:  PROVENTIL HFA;VENTOLIN HFA  Inhale 2 puffs into the lungs every 6 (six) hours as needed for wheezing.     aspirin-acetaminophen-caffeine 683-419-62 MG per tablet  Commonly known as:  EXCEDRIN MIGRAINE  Take 1 tablet by mouth once as needed for migraine.     diphenhydrAMINE 25 MG tablet  Commonly known as:  BENADRYL  Take 25 mg by mouth daily as needed for allergies.     naproxen sodium 220 MG tablet  Commonly known as:  ANAPROX  Take 660 mg by mouth once as needed (pain.).       Allergies  Allergen Reactions  . Shellfish Allergy Hives   Follow-up Information   Follow up with AVBUERE,EDWIN A, MD. Schedule an appointment as soon as possible for a visit in 1 week. (f/u in 1-2 weeks.)    Specialty:  Internal Medicine   Contact information:   [address] [place] [address] [phone]        The results of significant diagnostics from this hospitalization (including imaging, microbiology, ancillary and laboratory) are listed below for reference.    Significant Diagnostic Studies: Dg Chest 2 View  04/24/2014   CLINICAL DATA:  Chest pain.  EXAM: CHEST  2 VIEW  COMPARISON:  10/17/2012  FINDINGS: The cardiac silhouette, mediastinal and hilar contours are normal. The lungs are clear. The bony thorax is intact.  IMPRESSION: Normal chest x-ray.   Electronically Signed   By: Kalman Jewels M.D.   On: 04/24/2014 13:06   Ct Head Wo Contrast  04/24/2014   CLINICAL DATA:  One day history of left-sided weakness  EXAM: CT HEAD WITHOUT CONTRAST  TECHNIQUE: Contiguous axial images were obtained from the base of the skull through the vertex without intravenous contrast.  COMPARISON:  None.  FINDINGS: The ventricles are normal in size and position.  There is no intracranial hemorrhage nor intracranial mass effect. No acute ischemic changes are demonstrated. There are no abnormal intracranial calcifications. The cerebellum and brainstem are normal.  The observed paranasal sinuses and mastoid air cells are clear. There is no acute skull fracture.  IMPRESSION: There is no acute intracranial abnormality.   Electronically Signed   By: David  Martinique   On: 04/24/2014 13:41   Mr Brain Wo Contrast  04/25/2014   CLINICAL DATA:  48 year old female who presented with 1 day duration of bilateral lower extremity weakness left greater than right, with progressive weakness subsequently involving the left upper extremity. Initial encounter. Possible stroke.  EXAM: MRI HEAD WITHOUT CONTRAST  MRA HEAD WITHOUT CONTRAST  TECHNIQUE: Multiplanar, multiecho pulse sequences of the brain and surrounding structures were obtained without intravenous contrast. Angiographic images of the head were obtained using MRA technique without contrast.  COMPARISON:  Head CT without contrast 04/24/2014.  FINDINGS: MRI HEAD FINDINGS  Cerebral volume is normal. No restricted diffusion to  suggest acute infarction. No midline shift, mass effect, evidence of mass lesion, ventriculomegaly, extra-axial collection or acute intracranial hemorrhage. Cervicomedullary junction and pituitary are within normal limits. Negative visualized cervical spine. Major intracranial vascular flow voids appear normal. Pearline Cables and white matter signal is within normal limits throughout the brain.  Visible internal auditory structures appear normal. Visualized paranasal sinuses and mastoids are clear. Visualized orbit soft tissues are within normal limits. Visualized scalp soft tissues are within normal limits.  Hyperostosis of the calvarium. Visualized bone marrow signal is within normal limits.  MRA HEAD FINDINGS  Antegrade flow in the posterior circulation with codominant distal vertebral arteries. Normal vertebrobasilar  junction. Normal PICA origins. Normal basilar artery. Normal SCA and PCA origins. Posterior communicating arteries are diminutive or absent. Normal bilateral PCA branches.  Antegrade flow in both ICA siphons. Mild tortuosity of the distal cervical ICAs. No siphon stenosis. Ophthalmic artery origins are within normal limits. Normal carotid termini. Normal MCA and ACA origins. Anterior communicating artery and visualized bilateral ACA branches are within normal limits, suspect a small median artery of the corpus callosum. Visualized bilateral MCA branches are within normal limits.  IMPRESSION: 1.  Normal non contrast MRI appearance of the brain. 2.  Negative intracranial MRA.   Electronically Signed   By: Lars Pinks M.D.   On: 04/25/2014 12:59   Mr Cervical Spine Wo Contrast  04/27/2014   CLINICAL DATA:  Bilateral lower extremity weakness, left greater than right. There is also a left arm weakness and tingling.  EXAM: MRI CERVICAL SPINE WITHOUT CONTRAST  TECHNIQUE: Multiplanar, multisequence MR imaging of the cervical spine was performed. No intravenous contrast was administered.  COMPARISON:  None.  FINDINGS: Normal alignment of the cervical vertebral bodies. Mild degenerative cervical spondylosis with multilevel disc disease and facet disease. The vertebral bodies demonstrate normal marrow signal except for mild endplate reactive changes. The cervical spinal cord demonstrates normal signal intensity. No cord lesions. No syrinx.  C2-3:  No significant findings.  C3-4: Shallow disc osteophyte complex on the left potentially irritating the left C4 nerve root. No spinal stenosis.  C4-5: Diffuse bulging degenerated annulus, osteophytic ridging and uncinate spurring contributing to mild mass effect on the ventral thecal sac and narrowing of the ventral CSF space. There is mild mass effect on both sides of the thecal sac along with medial foraminal narrowing, right greater than left potentially involving both C5 nerve  roots, right greater than left.  C5-6: Broad-based disc protrusion, osteophytic ridging and uncinate spurring with mass effect on the thecal sac and moderate right-sided foraminal stenosis.  C6-7: Broad-based disc protrusion and osteophytic ridging with mass effect on the ventral thecal sac. Minimal right foraminal encroachment also.  C7-T1:  No significant findings.  T2-3 demonstrates a shallow right paracentral disc protrusion.  IMPRESSION: 1. Degenerative cervical spondylosis with multilevel disc disease and facet disease. 2. Shallow disc osteophyte complex on the left at C3-4. 3. Mild spinal and bilateral foraminal stenosis at C4-5, right greater than left. 4. Mild spinal and moderate right-sided foraminal stenosis at C5-6. 5. Mild spinal and minimal right foraminal encroachment at C6-7.   Electronically Signed   By: Kalman Jewels M.D.   On: 04/27/2014 15:29   Mr Jodene Nam Head/brain Wo Cm  04/25/2014   CLINICAL DATA:  48 year old female who presented with 1 day duration of bilateral lower extremity weakness left greater than right, with progressive weakness subsequently involving the left upper extremity. Initial encounter. Possible stroke.  EXAM: MRI HEAD WITHOUT CONTRAST  MRA HEAD WITHOUT CONTRAST  TECHNIQUE: Multiplanar, multiecho pulse sequences of the brain and surrounding structures were obtained without intravenous contrast. Angiographic images of the head were obtained using MRA technique without contrast.  COMPARISON:  Head CT without contrast 04/24/2014.  FINDINGS: MRI HEAD FINDINGS  Cerebral volume is normal. No restricted diffusion to suggest acute infarction. No midline shift, mass effect, evidence of mass lesion, ventriculomegaly, extra-axial collection or acute intracranial hemorrhage. Cervicomedullary junction and pituitary are within normal limits. Negative visualized cervical spine. Major intracranial vascular flow voids appear normal. Pear[place] Cables and white matter signal is within normal limits  throughout the brain.  Visible internal auditory structures appear normal. Visualized paranasal sinuses and mastoids are clear. Visualized orbit soft tissues are within normal limits. Visualized scalp soft tissues are within normal limits.  Hyperostosis of the calvarium. Visualized bone marrow signal is within normal limits.  MRA HEAD FINDINGS  Antegrade flow in the posterior circulation with codominant distal vertebral arteries. Normal vertebrobasilar junction. Normal PICA origins. Normal basilar artery. Normal SCA and PCA origins. Posterior communicating arteries are diminutive or absent. Normal bilateral PCA branches.  Antegrade flow in both ICA siphons. Mild tortuosity of the distal cervical ICAs. No siphon stenosis. Ophthalmic artery origins are within normal limits. Normal carotid termini. Normal MCA and ACA origins. Anterior communicating artery and visualized bilateral ACA branches are within normal limits, suspect a small median artery of the corpus callosum. Visualized bilateral MCA branches are within normal limits.  IMPRESSION: 1.  Normal non contrast MRI appearance of the brain. 2.  Negative intracranial MRA.   Electronically Signed   By: Lars Pinks M.D.   On: 04/25/2014 12:59    Microbiology: No results found for this or any previous visit (from the past 240 hour(s)).   Labs: Basic Metabolic Panel:  Recent Labs Lab 04/24/14 1219 04/25/14 0610 04/27/14 0750  NA 139 139 140  K 4.3 3.7 3.4*  CL 106 105 104  CO2 21 22 22   GLUCOSE 101* 106* 108*  BUN 10 11 7   CREATININE 0.57 0.58 0.52  CALCIUM 9.0 8.4 8.8   Liver Function Tests: No results found for this basename: AST, ALT, ALKPHOS, BILITOT, PROT, ALBUMIN,  in the last 168 hours No results found for this basename: LIPASE, AMYLASE,  in the last 168 hours No results found for this basename: AMMONIA,  in the last 168 hours CBC:  Recent Labs Lab 04/24/14 1219 04/25/14 0610  WBC 5.0 4.9  HGB 12.0 11.2*  HCT 34.9* 32.7*  MCV  86.4 86.7  PLT 264 241   Cardiac Enzymes: No results found for this basename: CKTOTAL, CKMB, CKMBINDEX, TROPONINI,  in the last 168 hours BNP: BNP (last 3 results) No results found for this basename: PROBNP,  in the last 8760 hours CBG: No results found for this basename: GLUCAP,  in the last 168 hours     Signed:  [facility] MD [facility] 04/27/2014, 5:11 PM

## 2014-04-27 NOTE — Progress Notes (Signed)
Patient is discharged from room 4N10 at this time. Alert and in stable condition. IV site d/c'd. Instructions read to patient and understanding verbalized. Left unit via wheelchair with belongings.

## 2014-04-27 NOTE — Progress Notes (Signed)
Physical Therapy Treatment Patient Details Name: Ashley Davis MRN: [id number] DOB: [birth year] Today's Date: 04/27/2014    History of Present Illness 48 yo female who presented with main concern of one day duration of bilateral LE weakness L>R, progressed to involve left arm weakness, associated with tingling. Pt also reports several episodes of 15 minutes long chest pains, located in the center of the chest, 5/10 in severity, occasionally but not consistently associated with left arm and leg numbness. MRI negative for acute infarct.    PT Comments    Emphasized improving gait quality and confidence, improving heel/toe pattern and equalizing step length.  Follow Up Recommendations  Outpatient PT;Supervision/Assistance - 24 hour     Equipment Recommendations  Rolling walker with 5" wheels    Recommendations for Other Services       Precautions / Restrictions Precautions Precautions: Fall Restrictions Weight Bearing Restrictions: No    Mobility  Bed Mobility Overal bed mobility: Modified Independent                Transfers Overall transfer level: Modified independent Equipment used: None Transfers: Sit to/from Stand Sit to Stand: Modified independent (Device/Increase time)         General transfer comment: cues for hand placement  Ambulation/Gait Ambulation/Gait assistance: Min guard Ambulation Distance (Feet): 300 Feet Assistive device: Rolling walker (2 wheeled) Gait Pattern/deviations: Step-through pattern Gait velocity: slow   General Gait Details: Emphasizing improved heel/toe gait, equalizing step length and looking forward.   Stairs Stairs: Yes Stairs assistance: Min guard Stair Management: One rail Right;Step to pattern;Forwards Number of Stairs: 12 General stair comments: steady with rail and carried RW  Wheelchair Mobility    Modified Rankin (Stroke Patients Only)       Balance Overall balance assessment: Needs assistance    Sitting balance-Leahy Scale: Good       Standing balance-Leahy Scale: Fair                      Cognition Arousal/Alertness: Awake/alert Behavior During Therapy: WFL for tasks assessed/performed Overall Cognitive Status: Within Functional Limits for tasks assessed                      Exercises      General Comments        Pertinent Vitals/Pain      Home Living                      Prior Function            PT Goals (current goals can now be found in the care plan section) Acute Rehab PT Goals PT Goal Formulation: With patient/family Time For Goal Achievement: 05/03/14 Potential to Achieve Goals: Good Progress towards PT goals: Progressing toward goals    Frequency  Min 4X/week    PT Plan Current plan remains appropriate    Co-evaluation             End of Session   Activity Tolerance: Patient tolerated treatment well Patient left: with call bell/phone within reach;in bed;with family/visitor present     Time: 3762-8315 PT Time Calculation (min): 26 min  Charges:  $Gait Training: 8-22 mins $Therapeutic Activity: 8-22 mins                    G Codes:      Toria Monte, Tessie Fass 04/27/2014, 5:06 PM 04/27/2014  Donnella Sham, PT [phone]  (pager)

## 2014-05-11 ENCOUNTER — Ambulatory Visit: Payer: BC Managed Care – PPO | Admitting: Occupational Therapy

## 2014-05-11 ENCOUNTER — Ambulatory Visit: Payer: BC Managed Care – PPO | Attending: Internal Medicine | Admitting: Physical Therapy

## 2014-05-11 DIAGNOSIS — G43909 Migraine, unspecified, not intractable, without status migrainosus: Secondary | ICD-10-CM | POA: Insufficient documentation

## 2014-05-11 DIAGNOSIS — Z5189 Encounter for other specified aftercare: Secondary | ICD-10-CM | POA: Diagnosis not present

## 2014-05-11 DIAGNOSIS — F09 Unspecified mental disorder due to known physiological condition: Secondary | ICD-10-CM | POA: Diagnosis not present

## 2014-05-11 DIAGNOSIS — R4701 Aphasia: Secondary | ICD-10-CM | POA: Insufficient documentation

## 2014-05-11 DIAGNOSIS — M6281 Muscle weakness (generalized): Secondary | ICD-10-CM | POA: Diagnosis not present

## 2014-05-15 ENCOUNTER — Ambulatory Visit (INDEPENDENT_AMBULATORY_CARE_PROVIDER_SITE_OTHER): Payer: BC Managed Care – PPO | Admitting: Neurology

## 2014-05-15 ENCOUNTER — Encounter: Payer: BC Managed Care – PPO | Admitting: Occupational Therapy

## 2014-05-15 ENCOUNTER — Ambulatory Visit: Payer: BC Managed Care – PPO | Admitting: Physical Therapy

## 2014-05-15 ENCOUNTER — Ambulatory Visit (INDEPENDENT_AMBULATORY_CARE_PROVIDER_SITE_OTHER): Payer: BC Managed Care – PPO | Admitting: Diagnostic Neuroimaging

## 2014-05-15 ENCOUNTER — Encounter: Payer: Self-pay | Admitting: Diagnostic Neuroimaging

## 2014-05-15 VITALS — BP 141/95 | HR 65 | Ht 69.5 in | Wt 284.0 lb

## 2014-05-15 DIAGNOSIS — G43109 Migraine with aura, not intractable, without status migrainosus: Secondary | ICD-10-CM

## 2014-05-15 MED ORDER — ELETRIPTAN HYDROBROMIDE 40 MG PO TABS
40.0000 mg | ORAL_TABLET | ORAL | Status: DC | PRN
Start: 2014-05-15 — End: 2016-09-03

## 2014-05-15 MED ORDER — VALPROATE SODIUM 500 MG/5ML IV SOLN
1000.0000 mg | INTRAVENOUS | Status: DC
  Administered 2014-05-15: 1000 mg via INTRAVENOUS

## 2014-05-15 MED ORDER — AMITRIPTYLINE HCL 25 MG PO TABS: 25.0000 mg | ORAL_TABLET | Freq: Every day | ORAL | Status: DC

## 2014-05-15 NOTE — Progress Notes (Signed)
[facility]  PATIENT: Ashley Davis DOB: [birth year]  REFERRING CLINICIAN: Aram Candela HISTORY FROM: patient  REASON FOR VISIT: new consult    HISTORICAL  CHIEF COMPLAINT:  Chief Complaint  Patient presents with  . Migraine    HISTORY OF PRESENT ILLNESS:   48 year old redheaded female here for evaluation of headaches.  Since age 89, patient has had a throbbing severe headaches with photophobia and phonophobia. She would take Excedrin Migraine for moderately. Over time headaches have significantly increased. For past 6 months headaches are significantly worse. Patient was taking Excedrin Migraine on a daily basis. Ultimately she was diagnosed with migraine and started on Relpax which helps control the headaches. Reports that she is having over 20 days of headache per month. No specific triggering factors. Now she has associated neck pain, sensitivity to light, eye pain.  This patient was admitted end of August 7902 for complicated migraine, with left-sided hemiparesis, weakness, numbness. Stroke workup was negative.   REVIEW OF SYSTEMS: Full 14 system review of systems performed and notable only for weight gain fatigue swelling in legs blurred vision snoring constipation rash allergies confusion headache numbness weakness  Insomnia snoring depression not enough sleep decr energy change in appetite.   ALLERGIES: Allergies  Allergen Reactions  . Shellfish Allergy Hives    HOME MEDICATIONS: Outpatient Prescriptions Prior to Visit  Medication Sig Dispense Refill  . albuterol (PROVENTIL HFA;VENTOLIN HFA) 108 (90 BASE) MCG/ACT inhaler Inhale 2 puffs into the lungs every 6 (six) hours as needed for wheezing.      Marland Kitchen aspirin-acetaminophen-caffeine (EXCEDRIN MIGRAINE) 250-250-65 MG per tablet Take 1 tablet by mouth once as needed for migraine.       . diphenhydrAMINE (BENADRYL) 25 MG tablet Take 25 mg by mouth daily as needed for allergies.      . naproxen sodium  (ANAPROX) 220 MG tablet Take 660 mg by mouth once as needed (pain.).       No facility-administered medications prior to visit.    PAST MEDICAL HISTORY: Past Medical History  Diagnosis Date  . Asthma   . Hypertension   . Migraine   . Depression     PAST SURGICAL HISTORY: Past Surgical History  Procedure Laterality Date  . No past surgeries      FAMILY HISTORY: Family History  Problem Relation Age of Onset  . Diabetes Mother   . Stroke Mother   . Cancer Mother   . Headache Mother   . Diabetes Father   . Other Father     neuropathy  . Headache Sister   . Aneurysm Sister     3 brain surgeries    SOCIAL HISTORY:  History   Social History  . Marital Status: Married    Spouse Name: Darren    Number of Children: 2  . Years of Education: College   Occupational History  .      n/a   Social History Main Topics  . Smoking status: Current Every Day Smoker -- 0.25 packs/day for 30 years    Types: Cigarettes    Last Attempt to Quit: 04/24/2008  . Smokeless tobacco: Never Used  . Alcohol Use: Yes     Comment: occasional  . Drug Use: No  . Sexual Activity: Not on file   Other Topics Concern  . Not on file   Social History Narrative   Patient lives at home with family.   Caffeine Use: 1 cup 2-3 times weekly     PHYSICAL EXAM  Filed Vitals:   05/15/14 1137  BP: 141/95  Pulse: 65  Height: 5' 9.5" (1.765 m)  Weight: 284 lb (128.822 kg)    Not recorded    Body mass index is 41.35 kg/(m^2).  GENERAL EXAM: Wll developed, nourished and groomed; NECK IS TENDER WITH FLEXION; TEARFUL, IN DARK ROOM, IN DISTRESS WITH HEADACHE PAIN  CARDIOVASCULAR: Regular rate and rhythm, no murmurs, no carotid bruits  NEUROLOGIC: MENTAL STATUS: awake, alert, oriented to person, place and time, recent and remote memory intact, normal attention and concentration, language fluent, comprehension intact, naming intact, fund of knowledge appropriate CRANIAL NERVE: no  papilledema on fundoscopic exam, pupils equal and reactive to light, visual fields full to confrontation, extraocular muscles intact, no nystagmus, facial sensation and strength symmetric, hearing intact, palate elevates symmetrically, uvula midline, shoulder shrug symmetric, tongue midline. MOTOR: normal bulk and tone, full strength in the BUE, RLE; LLE WEAK (HF 4, DF 4) SENSORY: DECR IN LEFT LEG/FOOT TO ALL MODALITIES; OTHERWISE NORMAL COORDINATION: finger-nose-finger, fine finger movements, heel-shin normal REFLEXES: deep tendon reflexes TRACE and symmetric GAIT/STATION: LIMPING ON LEFT LEG; SLOW, CAREFUL; Romberg is negative    DIAGNOSTIC DATA (LABS, IMAGING, TESTING) - I reviewed patient records, labs, notes, testing and imaging myself where available.  Lab Results  Component Value Date   WBC 4.9 04/25/2014   HGB 11.2* 04/25/2014   HCT 32.7* 04/25/2014   MCV 86.7 04/25/2014   PLT 241 04/25/2014      Component Value Date/Time   NA 140 04/27/2014 0750   K 3.4* 04/27/2014 0750   CL 104 04/27/2014 0750   CO2 22 04/27/2014 0750   GLUCOSE 108* 04/27/2014 0750   BUN 7 04/27/2014 0750   CREATININE 0.52 04/27/2014 0750   CALCIUM 8.8 04/27/2014 0750   GFRNONAA >90 04/27/2014 0750   GFRAA >90 04/27/2014 0750   Lab Results  Component Value Date   CHOL 152 04/25/2014   HDL 28* 04/25/2014   LDLCALC 113* 04/25/2014   TRIG 57 04/25/2014   CHOLHDL 5.4 04/25/2014   Lab Results  Component Value Date   HGBA1C 5.2 04/25/2014   No results found for this basename: VITAMINB12   No results found for this basename: TSH   I reviewed images myself and agree with interpretation. -VRP  04/25/14 MRI brain - normal  04/25/14 MRA head - normal  04/27/14 MRI cervical spine 1. Degenerative cervical spondylosis with multilevel disc disease and facet disease.  2. Shallow disc osteophyte complex on the left at C3-4.  3. Mild spinal and bilateral foraminal stenosis at C4-5, right greater than left.  4. Mild spinal  and moderate right-sided foraminal stenosis at C5-6.  5. Mild spinal and minimal right foraminal encroachment at C6-7.   ASSESSMENT AND PLAN  48 y.o. year old female here with headaches since age 46 years old, worse in last 6 months. Likely represents migraine.   PLAN: - depacon infusion (1000mg  IV x 1 today) - amitriptyline 25mg  at bedtime - relpax prn migraine  Meds ordered this encounter  Medications  . amitriptyline (ELAVIL) 25 MG tablet    Sig: Take 1 tablet (25 mg total) by mouth at bedtime.    Dispense:  30 tablet    Refill:  6  . eletriptan (RELPAX) 40 MG tablet    Sig: Take 1 tablet (40 mg total) by mouth as needed for migraine. May repeat in 2 hours if headache persists or recurs.    Dispense:  10 tablet    Refill:  6  Return in about 3 months (around 08/14/2014) for with Charlott Holler or Aristidis Talerico.    Penni Bombard, MD 1/47/8295, 62:13 PM Certified in Neurology, Neurophysiology and Neuroimaging  [facility] [address], [place], [place] [address] [phone]

## 2014-05-15 NOTE — Patient Instructions (Signed)

## 2014-05-15 NOTE — Progress Notes (Signed)
Patient here seeing Dr. Leta Baptist.  Order for Depacon 1000mg  IV for headache.  Patient to treatment room.  Pain level about 8.5 in back of head going down right side of neck.  Under aseptic technique IV started in left had, good blood return, 24g angiocath.  Depacon 1000mg /100cc NaCl started at 1300.  Patient had some pain at upper end of left arm, but not at IV site. I stopped infusion and asked if she wanted to discontinue infusion but she did not, and infusion was completed with good results.  Her headache pain level upon completion was 2.5.  No more pain in her neck area.  IV was stopped and removed.  Patient to check out in NAD.

## 2014-05-17 ENCOUNTER — Ambulatory Visit: Payer: BC Managed Care – PPO | Admitting: Occupational Therapy

## 2014-05-17 ENCOUNTER — Ambulatory Visit: Payer: BC Managed Care – PPO | Admitting: Physical Therapy

## 2014-05-17 DIAGNOSIS — Z5189 Encounter for other specified aftercare: Secondary | ICD-10-CM | POA: Diagnosis not present

## 2014-05-23 ENCOUNTER — Encounter: Payer: BC Managed Care – PPO | Admitting: Occupational Therapy

## 2014-05-23 ENCOUNTER — Ambulatory Visit: Payer: BC Managed Care – PPO | Admitting: Physical Therapy

## 2014-05-25 ENCOUNTER — Ambulatory Visit: Payer: BC Managed Care – PPO | Admitting: Occupational Therapy

## 2014-05-25 ENCOUNTER — Ambulatory Visit: Payer: BC Managed Care – PPO | Admitting: Rehabilitative and Restorative Service Providers"

## 2014-05-25 DIAGNOSIS — Z5189 Encounter for other specified aftercare: Secondary | ICD-10-CM | POA: Diagnosis not present

## 2014-05-30 ENCOUNTER — Ambulatory Visit: Payer: BC Managed Care – PPO

## 2014-05-30 ENCOUNTER — Ambulatory Visit: Payer: BC Managed Care – PPO | Admitting: Occupational Therapy

## 2014-05-30 ENCOUNTER — Ambulatory Visit: Payer: BC Managed Care – PPO | Admitting: Physical Therapy

## 2014-05-30 DIAGNOSIS — Z5189 Encounter for other specified aftercare: Secondary | ICD-10-CM | POA: Diagnosis not present

## 2014-06-01 ENCOUNTER — Ambulatory Visit: Payer: BC Managed Care – PPO | Admitting: Physical Therapy

## 2014-06-01 ENCOUNTER — Encounter: Payer: BC Managed Care – PPO | Admitting: Occupational Therapy

## 2014-06-06 ENCOUNTER — Ambulatory Visit: Payer: BC Managed Care – PPO | Attending: Internal Medicine | Admitting: Physical Therapy

## 2014-06-06 ENCOUNTER — Ambulatory Visit: Payer: BC Managed Care – PPO

## 2014-06-06 ENCOUNTER — Ambulatory Visit (INDEPENDENT_AMBULATORY_CARE_PROVIDER_SITE_OTHER): Payer: BC Managed Care – PPO | Admitting: Nurse Practitioner

## 2014-06-06 ENCOUNTER — Ambulatory Visit: Payer: BC Managed Care – PPO | Admitting: Occupational Therapy

## 2014-06-06 ENCOUNTER — Encounter: Payer: Self-pay | Admitting: Nurse Practitioner

## 2014-06-06 ENCOUNTER — Ambulatory Visit (INDEPENDENT_AMBULATORY_CARE_PROVIDER_SITE_OTHER): Payer: BC Managed Care – PPO | Admitting: Neurology

## 2014-06-06 VITALS — BP 146/93 | HR 101 | Temp 98.5°F | Ht 69.5 in | Wt 293.6 lb

## 2014-06-06 DIAGNOSIS — R4701 Aphasia: Secondary | ICD-10-CM | POA: Diagnosis not present

## 2014-06-06 DIAGNOSIS — F4310 Post-traumatic stress disorder, unspecified: Secondary | ICD-10-CM

## 2014-06-06 DIAGNOSIS — G43909 Migraine, unspecified, not intractable, without status migrainosus: Secondary | ICD-10-CM | POA: Insufficient documentation

## 2014-06-06 DIAGNOSIS — R269 Unspecified abnormalities of gait and mobility: Secondary | ICD-10-CM | POA: Insufficient documentation

## 2014-06-06 DIAGNOSIS — F329 Major depressive disorder, single episode, unspecified: Secondary | ICD-10-CM

## 2014-06-06 DIAGNOSIS — G8194 Hemiplegia, unspecified affecting left nondominant side: Secondary | ICD-10-CM | POA: Diagnosis not present

## 2014-06-06 DIAGNOSIS — G43011 Migraine without aura, intractable, with status migrainosus: Secondary | ICD-10-CM

## 2014-06-06 DIAGNOSIS — F32A Depression, unspecified: Secondary | ICD-10-CM

## 2014-06-06 DIAGNOSIS — R279 Unspecified lack of coordination: Secondary | ICD-10-CM | POA: Insufficient documentation

## 2014-06-06 DIAGNOSIS — M6281 Muscle weakness (generalized): Secondary | ICD-10-CM | POA: Diagnosis not present

## 2014-06-06 MED ORDER — RIZATRIPTAN BENZOATE 10 MG PO TBDP: 10.0000 mg | ORAL_TABLET | ORAL | Status: DC | PRN

## 2014-06-06 MED ORDER — KETOROLAC TROMETHAMINE 30 MG/ML IJ SOLN
30.0000 mg | Freq: Once | INTRAMUSCULAR | Status: AC
  Administered 2014-06-06: 30 mg via INTRAMUSCULAR

## 2014-06-06 MED ORDER — PROMETHAZINE HCL 25 MG/ML IJ SOLN
25.0000 mg | Freq: Once | INTRAMUSCULAR | Status: AC
  Administered 2014-06-06: 25 mg via INTRAMUSCULAR

## 2014-06-06 MED ORDER — TOPIRAMATE 25 MG PO TABS: ORAL_TABLET | ORAL | Status: DC

## 2014-06-06 NOTE — Patient Instructions (Addendum)
PLAN: - Toradaol 30 mg IM and Phenegan 25 mg IM injection today.   - wean amitriptyline, take 1/2 tablet for next 3 nights and stop  - Start Topamax, 25 mg at bedtime for 2 weeks then increase to 50 mg at bedtime as Migraine preventative. Possible side effects are tingling in fingertips, around mouth, or toes.  You may have less appetite. Carbonated drinks will taste bad. - Please followup with your PCP Dr. Ronnald Ramp ASAP for high blood pressure. - Rizatriptan (Maxalt) disintegrating tablet prn at start of migraine.  - I recommend seeing a therapist that specializes in PTSD or grief counseling. Sometimes the headaches will not get better until these feelings are dealt with.  I will refer you to Sheralyn Boatman, MD, they will contact you for an appointment.  There will be a wait to get in, possibly 6 weeks or more. You may ask to be put on the cancellation list, and maybe get in sooner. If the wait is too long, and you want to try somewhere else, let me know. - Follow up in 6 weeks, sooner as needed.

## 2014-06-06 NOTE — Progress Notes (Signed)
Patient here for office  injection. Order for Toradol 30mg  IM and Phenergan 25mg  IM.  Under aseptic technique Toradol 30mg  given IM in right gluteal and Phenergan 25mg  diluted in 8cc 0.9% NaCl given IM in left gluteal.  Tolerated well.  Band-Aids applied.

## 2014-06-06 NOTE — Progress Notes (Signed)
PATIENT: Ashley Davis DOB: [birth year]  REASON FOR VISIT: sooner follow up for headaches HISTORY FROM: patient  HISTORY OF PRESENT ILLNESS: 48 year old redheaded female here for evaluation of headaches.   Update 06/06/14 (LL): Patient calls for sooner appointment, headaches are not improved on Elavil.  Having weight gain and myoclonic jerks at night. States she thinks about seeing her mother while dying constantly at night and in dreams. Headaches are severe and persistent. Relpax makes her sleepy and only dulls the headache. Headaches have been every day since last visit.  She has not been able to work since the end of August. Has been in physical therapy and speech therapy, elevated blood pressure today, 184/111 at therapist office. Does not want infusion today, had Depacon infusion last visit and felt better for a while but the headache returned later.  Since age 66, patient has had a throbbing severe headaches with photophobia and phonophobia. She would take Excedrin Migraine for moderately. Over time headaches have significantly increased. For past 6 months headaches are significantly worse. Patient was taking Excedrin Migraine on a daily basis. Ultimately she was diagnosed with migraine and started on Relpax which helps control the headaches. Reports that she is having over 20 days of headache per month. No specific triggering factors. Now she has associated neck pain, sensitivity to light, eye pain.  This patient was admitted end of August 2542 for complicated migraine, with left-sided hemiparesis, weakness, numbness. Stroke workup was negative.   REVIEW OF SYSTEMS: Full 14 system review of systems performed and notable only for weight gain fatigue swelling in legs blurred vision snoring constipation rash allergies confusion headache numbness weakness Insomnia snoring depression not enough sleep decr energy change in appetite.   ALLERGIES: Allergies  Allergen Reactions  . Shellfish  Allergy Hives    HOME MEDICATIONS: Outpatient Prescriptions Prior to Visit  Medication Sig Dispense Refill  . albuterol (PROVENTIL HFA;VENTOLIN HFA) 108 (90 BASE) MCG/ACT inhaler Inhale 2 puffs into the lungs every 6 (six) hours as needed for wheezing.      . diphenhydrAMINE (BENADRYL) 25 MG tablet Take 25 mg by mouth daily as needed for allergies.      Marland Kitchen eletriptan (RELPAX) 40 MG tablet Take 1 tablet (40 mg total) by mouth as needed for migraine. May repeat in 2 hours if headache persists or recurs.  10 tablet  6  . methocarbamol (ROBAXIN) 500 MG tablet Take 500 mg by mouth 2 (two) times daily as needed for muscle spasms.      . metoprolol tartrate (LOPRESSOR) 25 MG tablet Take 25 mg by mouth 2 (two) times daily.      . ondansetron (ZOFRAN) 4 MG tablet Take 4 mg by mouth every 8 (eight) hours as needed for nausea or vomiting.      Marland Kitchen amitriptyline (ELAVIL) 25 MG tablet Take 1 tablet (25 mg total) by mouth at bedtime.  30 tablet  6  . aspirin-acetaminophen-caffeine (EXCEDRIN MIGRAINE) 706-237-62 MG per tablet Take 1 tablet by mouth once as needed for migraine.       . naproxen sodium (ANAPROX) 220 MG tablet Take 660 mg by mouth once as needed (pain.).       Facility-Administered Medications Prior to Visit  Medication Dose Route Frequency Provider Last Rate Last Dose  . valproate (DEPACON) 1,000 mg in sodium chloride 0.9 % 100 mL IVPB  1,000 mg Intravenous Continuous Penni Bombard, MD   1,000 mg at 05/15/14 [place]  Vitals:   06/06/14 1456  BP: 146/93  Pulse: 101  Temp: 98.5 F (36.9 C)  TempSrc: Oral  Height: 5' 9.5" (1.765 m)  Weight: 293 lb 9.6 oz (133.176 kg)   Body mass index is 42.75 kg/(m^2).  GENERAL EXAM:  Wll developed, nourished and groomed; NECK IS TENDER WITH FLEXION; TEARFUL, IN DARK ROOM, IN DISTRESS WITH HEADACHE PAIN  CARDIOVASCULAR:  Regular rate and rhythm, no murmurs, no carotid bruits   NEUROLOGIC:  MENTAL STATUS: awake, alert,  oriented to person, place and time, recent and remote memory intact, normal attention and concentration, language fluent, comprehension intact, naming intact, fund of knowledge appropriate  CRANIAL NERVE: no papilledema on fundoscopic exam, pupils equal and reactive to light, visual fields full to confrontation, extraocular muscles intact, no nystagmus, facial sensation and strength symmetric, hearing intact, palate elevates symmetrically, uvula midline, shoulder shrug symmetric, tongue midline.  MOTOR: normal bulk and tone, full strength in the BUE, RLE; LLE WEAK (HF 4, DF 4)  SENSORY: DECR IN LEFT LEG/FOOT TO ALL MODALITIES; OTHERWISE NORMAL  COORDINATION: finger-nose-finger, fine finger movements, heel-shin normal  REFLEXES: deep tendon reflexes TRACE and symmetric  GAIT/STATION: LIMPING ON LEFT LEG; SLOW, CAREFUL; Romberg is negative   DIAGNOSTIC DATA (LABS, IMAGING, TESTING) - I reviewed patient records, labs, notes, testing and imaging myself where available.  04/25/14 MRI brain - normal  04/25/14 MRA head - normal   04/27/14 MRI cervical spine  1. Degenerative cervical spondylosis with multilevel disc disease and facet disease.  2. Shallow disc osteophyte complex on the left at C3-4.  3. Mild spinal and bilateral foraminal stenosis at C4-5, right greater than left.  4. Mild spinal and moderate right-sided foraminal stenosis at C5-6.  5. Mild spinal and minimal right foraminal encroachment at C6-7.   ASSESSMENT: 48 y.o. year old female here with headaches since age 5 years old, worse in last 6 months. Likely represents migraine. Significant stressor of recently loosing her mother is likely contributing to frequency and severity of headaches.  PLAN: - Toradaol 30 mg IM and Phenegan 25 mg IM injection today.  - wean amitriptyline, take 1/2 tablet for next 3 nights and stop - Start Topamax, 25 mg at bedtime for 2 weeks then increase to 50 mg at bedtime as Migraine preventative. Possible  side effects discussed. - Please followup with your PCP Dr. Ronnald Ramp ASAP for high blood pressure. - Rizatriptan (Maxalt) disintegrating tablet prn at start of migraine. - I recommend seeing a therapist that specializes in PTSD or grief counseling. Sometimes the headaches will not get better until these feelings are dealt with.  I will refer you to Sheralyn Boatman, MD, they will contact you for an appointment.  There will be a wait to get in, possibly 6 weeks or more. You may ask to be put on the cancellation list, and maybe get in sooner. If the wait is too long, and you want to try somewhere else, let me know. - Follow up in 6 weeks, sooner as needed.  Orders Placed This Encounter  Procedures  . Ambulatory referral to Psychiatry   Meds ordered this encounter  Medications  . rizatriptan (MAXALT-MLT) 10 MG disintegrating tablet    Sig: Take 1 tablet (10 mg total) by mouth as needed for migraine. May repeat in 2 hours if needed    Dispense:  12 tablet    Refill:  5    Order Specific Question:  Supervising Provider    Answer:  [place], [place]  Return in about 6 weeks (around 07/18/2014) for Migraine.  Rudi Rummage Quincee Gittens, MSN, FNP-BC, A/GNP-C 06/06/2014, 4:02 PM [facility] [address] [place], [place] [address] [phone]  Note: This document was prepared with digital dictation and possible smart phrase technology. Any transcriptional errors that result from this process are unintentional.

## 2014-06-07 ENCOUNTER — Telehealth: Payer: Self-pay | Admitting: Nurse Practitioner

## 2014-06-07 MED ORDER — TOPIRAMATE 25 MG PO TABS: ORAL_TABLET | ORAL | Status: DC

## 2014-06-07 NOTE — Telephone Encounter (Signed)
Patient Rx for topiramate (TOPAMAX) 25 MG tablet.  Stated [facility] had received Rx request on 10/6.  Please call [phone] and advise.

## 2014-06-07 NOTE — Telephone Encounter (Signed)
Rx has been resent.  I called back, got no answer.  Left message.  

## 2014-06-09 ENCOUNTER — Ambulatory Visit: Payer: BC Managed Care – PPO | Admitting: Occupational Therapy

## 2014-06-09 ENCOUNTER — Ambulatory Visit: Payer: BC Managed Care – PPO | Admitting: Physical Therapy

## 2014-06-09 DIAGNOSIS — R4701 Aphasia: Secondary | ICD-10-CM | POA: Diagnosis not present

## 2014-06-12 NOTE — Progress Notes (Signed)
I reviewed note and agree with plan.   VIKRAM R. PENUMALLI, MD  Certified in Neurology, Neurophysiology and Neuroimaging  [facility] [address], [address] [place], [place] [address] [phone]   

## 2014-06-13 ENCOUNTER — Telehealth: Payer: Self-pay | Admitting: Nurse Practitioner

## 2014-06-13 NOTE — Telephone Encounter (Signed)
Patient stated Dr. Letta Moynahan not accepting new patients until January.  Jeani Hawking, NP instructed patient to let her now if there was a long wait.  Please call anytime and may leave detailed message on voicemail.

## 2014-06-14 ENCOUNTER — Ambulatory Visit: Payer: BC Managed Care – PPO

## 2014-06-14 ENCOUNTER — Other Ambulatory Visit: Payer: Self-pay | Admitting: Nurse Practitioner

## 2014-06-14 ENCOUNTER — Encounter: Payer: BC Managed Care – PPO | Admitting: Occupational Therapy

## 2014-06-14 DIAGNOSIS — F4310 Post-traumatic stress disorder, unspecified: Secondary | ICD-10-CM

## 2014-06-14 DIAGNOSIS — G43011 Migraine without aura, intractable, with status migrainosus: Secondary | ICD-10-CM

## 2014-06-14 DIAGNOSIS — F329 Major depressive disorder, single episode, unspecified: Secondary | ICD-10-CM

## 2014-06-14 DIAGNOSIS — F32A Depression, unspecified: Secondary | ICD-10-CM

## 2014-06-14 NOTE — Telephone Encounter (Signed)
Please call patient , I would also try the [facility], they have a new location on [facility]. I will enter a referral for her.

## 2014-06-14 NOTE — Telephone Encounter (Signed)
Called and informed patient that new referral will be faxed the [facility]

## 2014-06-15 NOTE — Telephone Encounter (Signed)
Called patient concerning referral for [facility], lt Vm for return call back

## 2014-06-16 ENCOUNTER — Ambulatory Visit: Payer: BC Managed Care – PPO

## 2014-06-16 ENCOUNTER — Ambulatory Visit: Payer: BC Managed Care – PPO | Admitting: Occupational Therapy

## 2014-06-16 DIAGNOSIS — R4701 Aphasia: Secondary | ICD-10-CM | POA: Diagnosis not present

## 2014-06-20 ENCOUNTER — Ambulatory Visit: Payer: BC Managed Care – PPO | Admitting: Physical Therapy

## 2014-06-20 ENCOUNTER — Encounter: Payer: BC Managed Care – PPO | Admitting: Occupational Therapy

## 2014-06-22 ENCOUNTER — Encounter: Payer: BC Managed Care – PPO | Admitting: Occupational Therapy

## 2014-06-26 ENCOUNTER — Ambulatory Visit: Payer: BC Managed Care – PPO | Admitting: Physical Therapy

## 2014-06-27 ENCOUNTER — Encounter: Payer: BC Managed Care – PPO | Admitting: Occupational Therapy

## 2014-06-27 ENCOUNTER — Ambulatory Visit: Payer: BC Managed Care – PPO | Admitting: Rehabilitative and Restorative Service Providers"

## 2014-06-29 ENCOUNTER — Encounter: Payer: BC Managed Care – PPO | Admitting: Occupational Therapy

## 2014-07-03 ENCOUNTER — Ambulatory Visit: Payer: BC Managed Care – PPO | Attending: Internal Medicine

## 2014-07-03 DIAGNOSIS — G43909 Migraine, unspecified, not intractable, without status migrainosus: Secondary | ICD-10-CM | POA: Insufficient documentation

## 2014-07-03 DIAGNOSIS — M6281 Muscle weakness (generalized): Secondary | ICD-10-CM | POA: Insufficient documentation

## 2014-07-03 DIAGNOSIS — R269 Unspecified abnormalities of gait and mobility: Secondary | ICD-10-CM | POA: Insufficient documentation

## 2014-07-03 DIAGNOSIS — R279 Unspecified lack of coordination: Secondary | ICD-10-CM | POA: Insufficient documentation

## 2014-07-03 DIAGNOSIS — R4701 Aphasia: Secondary | ICD-10-CM | POA: Insufficient documentation

## 2014-07-03 DIAGNOSIS — G8194 Hemiplegia, unspecified affecting left nondominant side: Secondary | ICD-10-CM | POA: Insufficient documentation

## 2014-07-06 ENCOUNTER — Telehealth: Payer: Self-pay | Admitting: Speech Pathology

## 2014-07-06 ENCOUNTER — Ambulatory Visit: Payer: BC Managed Care – PPO | Admitting: Speech Pathology

## 2014-07-06 NOTE — Telephone Encounter (Signed)
Left message re: missed ST appointment. Requested pt. Call front desk to confirm her next appt or to request d/c from [facility] if she is pleased with her current level of function.

## 2014-07-11 ENCOUNTER — Ambulatory Visit: Payer: BC Managed Care – PPO | Admitting: Speech Pathology

## 2014-07-11 ENCOUNTER — Ambulatory Visit: Payer: BC Managed Care – PPO | Admitting: Occupational Therapy

## 2014-07-13 ENCOUNTER — Ambulatory Visit: Payer: BC Managed Care – PPO

## 2014-07-17 ENCOUNTER — Encounter: Payer: BC Managed Care – PPO | Admitting: Occupational Therapy

## 2014-07-17 ENCOUNTER — Telehealth: Payer: Self-pay | Admitting: Diagnostic Neuroimaging

## 2014-07-17 ENCOUNTER — Encounter: Payer: Self-pay | Admitting: Diagnostic Neuroimaging

## 2014-07-17 NOTE — Telephone Encounter (Signed)
Printed and mailed letter with adjusted appointment time on 08/14/14 per Jeani Hawking leaving, rescheduled to Dr. Gladstone Lighter schedule.

## 2014-07-18 ENCOUNTER — Encounter: Payer: Self-pay | Admitting: Speech Pathology

## 2014-07-18 ENCOUNTER — Ambulatory Visit: Payer: BC Managed Care – PPO | Admitting: Nurse Practitioner

## 2014-07-18 NOTE — Therapy (Signed)
  Patient Details  Name: REBBECA SHEPERD MRN: [id number] Date of Birth: 11/08/1965  Encounter Date: 07/18/2014  SPEECH THERAPY DISCHARGE SUMMARY  Visits from Start of Care: 2  Current functional level related to goals / functional outcomes: Goals not met. Pt did not return to ST   Remaining deficits: anomia   Education / Equipment: Compensatory strategies for anomia in conversation and written expression  Plan: Patient agrees to discharge.  Patient goals were not met. Patient is being discharged due to not returning since the last visit.  ?????       Long Term Goals: 1.Demonstrate compensatory strategies for anomia during 10 minute complex conversation 2.Write multi-sentence responses with 90% success and emergent awareness of errors 3. Demonstrate selective attention in moderately complex linguistic tasks over 20 minutes in distracting environment  Pt goals were not met. Pt did not return to speech therapy   Ryver Zadrozny, Annye Rusk 07/18/2014, 11:01 AM  Corbitt Cloke, Annye Rusk, CCC-SLP

## 2014-07-19 ENCOUNTER — Encounter: Payer: BC Managed Care – PPO | Admitting: Occupational Therapy

## 2014-07-19 ENCOUNTER — Encounter: Payer: Self-pay | Admitting: *Deleted

## 2014-07-24 ENCOUNTER — Encounter: Payer: BC Managed Care – PPO | Admitting: Speech Pathology

## 2014-07-24 ENCOUNTER — Encounter: Payer: BC Managed Care – PPO | Admitting: Occupational Therapy

## 2014-07-25 ENCOUNTER — Ambulatory Visit: Payer: BC Managed Care – PPO

## 2014-07-25 ENCOUNTER — Encounter: Payer: BC Managed Care – PPO | Admitting: Occupational Therapy

## 2014-08-14 ENCOUNTER — Ambulatory Visit: Payer: BC Managed Care – PPO | Admitting: Nurse Practitioner

## 2014-08-14 ENCOUNTER — Ambulatory Visit: Payer: BC Managed Care – PPO | Admitting: Diagnostic Neuroimaging

## 2014-08-16 ENCOUNTER — Encounter: Payer: Self-pay | Admitting: Diagnostic Neuroimaging

## 2015-01-20 ENCOUNTER — Emergency Department (HOSPITAL_COMMUNITY)
Admission: EM | Admit: 2015-01-20 | Discharge: 2015-01-20 | Disposition: A | Discharge status: Home / Self Care | Payer: Self-pay | Attending: Emergency Medicine | Admitting: Emergency Medicine

## 2015-01-20 ENCOUNTER — Encounter (HOSPITAL_COMMUNITY): Payer: Self-pay | Admitting: *Deleted

## 2015-01-20 ENCOUNTER — Emergency Department (HOSPITAL_COMMUNITY): Payer: Self-pay

## 2015-01-20 DIAGNOSIS — Z791 Long term (current) use of non-steroidal anti-inflammatories (NSAID): Secondary | ICD-10-CM | POA: Insufficient documentation

## 2015-01-20 DIAGNOSIS — I10 Essential (primary) hypertension: Secondary | ICD-10-CM | POA: Insufficient documentation

## 2015-01-20 DIAGNOSIS — Z8673 Personal history of transient ischemic attack (TIA), and cerebral infarction without residual deficits: Secondary | ICD-10-CM | POA: Insufficient documentation

## 2015-01-20 DIAGNOSIS — G43909 Migraine, unspecified, not intractable, without status migrainosus: Secondary | ICD-10-CM | POA: Insufficient documentation

## 2015-01-20 DIAGNOSIS — Z79899 Other long term (current) drug therapy: Secondary | ICD-10-CM | POA: Insufficient documentation

## 2015-01-20 DIAGNOSIS — J209 Acute bronchitis, unspecified: Secondary | ICD-10-CM

## 2015-01-20 DIAGNOSIS — Z720 Tobacco use: Secondary | ICD-10-CM | POA: Insufficient documentation

## 2015-01-20 DIAGNOSIS — J45901 Unspecified asthma with (acute) exacerbation: Secondary | ICD-10-CM | POA: Insufficient documentation

## 2015-01-20 DIAGNOSIS — F329 Major depressive disorder, single episode, unspecified: Secondary | ICD-10-CM | POA: Insufficient documentation

## 2015-01-20 HISTORY — DX: Cerebral infarction, unspecified: I63.9

## 2015-01-20 MED ORDER — AZITHROMYCIN 250 MG PO TABS
ORAL_TABLET | ORAL | Status: DC
Start: 2015-01-20 — End: 2015-10-29

## 2015-01-20 NOTE — ED Notes (Signed)
Patient states she has had cough productive of sputum x3 weeks and sore throat.  Patient also intermittent c/o nasal congestion x3 weeks.  Patient reports sweating, but has not measured temperature.  Patient denies overt chest pain, but complains of chest tightness.  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  Patient's lung sounds diminished all fields - expiratory wheezing right upper and lower.

## 2015-01-20 NOTE — Discharge Instructions (Signed)
If you were given medicines take as directed.  If you are on coumadin or contraceptives realize their levels and effectiveness is altered by many different medicines.  If you have any reaction (rash, tongues swelling, other) to the medicines stop taking and see a physician.    If your blood pressure was elevated in the ER make sure you follow up for management with a primary doctor or return for chest pain, shortness of breath or stroke symptoms.  Please follow up as directed and return to the ER or see a physician for new or worsening symptoms.  Thank you. Filed Vitals:   01/20/15 0835  BP: 147/80  Pulse: 77  Temp: 98.5 F (36.9 C)  TempSrc: Oral  Height: 5\' 10"  (1.778 m)  Weight: 292 lb (132.45 kg)  SpO2: 100%

## 2015-01-20 NOTE — ED Provider Notes (Signed)
CSN: [id number]     Arrival date & time 01/20/15  0825 History   First MD Initiated Contact with Patient 01/20/15 [phone]     Chief Complaint  Patient presents with  . Cough     (Consider location/radiation/quality/duration/timing/severity/associated sxs/prior Treatment) HPI Comments: 49 year old female with history of migraine and asthma presents with recurrent productive cough yellow and brown gradually worsening for the past 3 weeks. No specifically known sick contacts ever patient does work in [employer]. No fevers or chills. Patient's had intermittent nasal sinus congestion. Patient has been trying over-the-counter treatments. Patient has had negative PPD recently. No congestive heart failure history, no recent surgeries, no leg swelling.  Patient is a 49 y.o. female presenting with cough. The history is provided by the patient.  Cough Associated symptoms: no chest pain, no chills, no fever, no headaches, no rash and no shortness of breath     Past Medical History  Diagnosis Date  . Asthma   . Migraine   . Depression   . Hypertension     Pt states her HTN is due to her migraines  . Stroke    Past Surgical History  Procedure Laterality Date  . No past surgeries     Family History  Problem Relation Age of Onset  . Diabetes Mother   . Stroke Mother   . Cancer Mother   . Headache Mother   . Diabetes Father   . Other Father     neuropathy  . Headache Sister   . Aneurysm Sister     3 brain surgeries   History  Substance Use Topics  . Smoking status: Current Every Day Smoker -- 0.25 packs/day for 30 years    Types: Cigarettes  . Smokeless tobacco: Never Used  . Alcohol Use: No   OB History    No data available     Review of Systems  Constitutional: Negative for fever and chills.  HENT: Positive for congestion.   Eyes: Negative for visual disturbance.  Respiratory: Positive for cough. Negative for shortness of breath.   Cardiovascular: Negative for chest  pain.  Gastrointestinal: Negative for vomiting and abdominal pain.  Genitourinary: Negative for dysuria and flank pain.  Musculoskeletal: Negative for back pain, neck pain and neck stiffness.  Skin: Negative for rash.  Neurological: Negative for light-headedness and headaches.      Allergies  Shellfish allergy  Home Medications   Prior to Admission medications   Medication Sig Start Date End Date Taking? Authorizing Provider  acetaminophen (TYLENOL) 500 MG tablet Take 1,000 mg by mouth every 6 (six) hours as needed for mild pain.   Yes Historical Provider, MD  diphenhydrAMINE (BENADRYL) 25 MG tablet Take 50 mg by mouth daily as needed for allergies.    Yes Historical Provider, MD  loratadine-pseudoephedrine (CLARITIN-D 24-HOUR) 10-240 MG per 24 hr tablet Take 1 tablet by mouth daily.   Yes Historical Provider, MD  naproxen sodium (ANAPROX) 220 MG tablet Take 440 mg by mouth 2 (two) times daily with a meal.   Yes Historical Provider, MD  albuterol (PROVENTIL HFA;VENTOLIN HFA) 108 (90 BASE) MCG/ACT inhaler Inhale 2 puffs into the lungs every 6 (six) hours as needed for wheezing.    Historical Provider, MD  azithromycin (ZITHROMAX Z-PAK) 250 MG tablet Take 2 pills on day one then 1 tab days 2 through 5. 01/20/15   Elnora Morrison, MD  eletriptan (RELPAX) 40 MG tablet Take 1 tablet (40 mg total) by mouth as needed for migraine. May  repeat in 2 hours if headache persists or recurs. 05/15/14   Penni Bombard, MD  rizatriptan (MAXALT-MLT) 10 MG disintegrating tablet Take 1 tablet (10 mg total) by mouth as needed for migraine. May repeat in 2 hours if needed 06/06/14   Philmore Pali, NP  topiramate (TOPAMAX) 25 MG tablet Take 1 tablet at bedtime x 2 weeks and then increase to 2 tabs at  Bedtime. 06/07/14   Philmore Pali, NP   BP 135/80 mmHg  Pulse 69  Temp(Src) 97.9 F (36.6 C) (Oral)  Resp 20  Ht 5\' 10"  (1.778 m)  Wt 292 lb (132.45 kg)  BMI 41.90 kg/m2  SpO2 99%  LMP 01/13/2015 Physical Exam   Constitutional: She is oriented to person, place, and time. She appears well-developed and well-nourished.  HENT:  Head: Normocephalic and atraumatic.  Eyes: Conjunctivae are normal. Right eye exhibits no discharge. Left eye exhibits no discharge.  Neck: Normal range of motion. Neck supple. No tracheal deviation present.  Cardiovascular: Normal rate and regular rhythm.   Pulmonary/Chest: Effort normal. She has wheezes (mild end expiratory wheeze, good air movement).  Musculoskeletal: She exhibits no edema.  Neurological: She is alert and oriented to person, place, and time.  Skin: Skin is warm. No rash noted.  Psychiatric: She has a normal mood and affect.  Nursing note and vitals reviewed.   ED Course  Procedures (including critical care time) Labs Review Labs Reviewed - No data to display  Imaging Review No results found.   EKG Interpretation None      MDM   Final diagnoses:  Acute bronchitis, unspecified organism   Patient presents with clinically upper respiratory infection versus pneumonia. No respiratory difficulty, vitals unremarkable patient has follow-up for her blood pressure outpatient. Plan for chest x-ray and with 3 weeks of worsening symptoms and healthcare exposure plan for trial of oral antibiotics.  Patient understands it may be recurrent viral symptoms as well.  Results and differential diagnosis were discussed with the patient/parent/guardian. Close follow up outpatient was discussed, comfortable with the plan.   Medications - No data to display  Filed Vitals:   01/20/15 0835 01/20/15 0954  BP: 147/80 135/80  Pulse: 77 69  Temp: 98.5 F (36.9 C) 97.9 F (36.6 C)  TempSrc: Oral Oral  Resp:  20  Height: 5\' 10"  (1.778 m)   Weight: 292 lb (132.45 kg)   SpO2: 100% 99%    Final diagnoses:  Acute bronchitis, unspecified organism        Elnora Morrison, MD 02/02/15 [phone]

## 2015-03-07 ENCOUNTER — Emergency Department (HOSPITAL_COMMUNITY)
Admission: EM | Admit: 2015-03-07 | Discharge: 2015-03-07 | Disposition: A | Discharge status: Home / Self Care | Payer: Self-pay | Attending: Emergency Medicine | Admitting: Emergency Medicine

## 2015-03-07 ENCOUNTER — Emergency Department (HOSPITAL_COMMUNITY): Payer: Self-pay

## 2015-03-07 ENCOUNTER — Encounter (HOSPITAL_COMMUNITY): Payer: Self-pay | Admitting: Physical Medicine and Rehabilitation

## 2015-03-07 DIAGNOSIS — Z791 Long term (current) use of non-steroidal anti-inflammatories (NSAID): Secondary | ICD-10-CM | POA: Insufficient documentation

## 2015-03-07 DIAGNOSIS — M10072 Idiopathic gout, left ankle and foot: Secondary | ICD-10-CM | POA: Insufficient documentation

## 2015-03-07 DIAGNOSIS — Z8673 Personal history of transient ischemic attack (TIA), and cerebral infarction without residual deficits: Secondary | ICD-10-CM | POA: Insufficient documentation

## 2015-03-07 DIAGNOSIS — J45909 Unspecified asthma, uncomplicated: Secondary | ICD-10-CM | POA: Insufficient documentation

## 2015-03-07 DIAGNOSIS — Z87891 Personal history of nicotine dependence: Secondary | ICD-10-CM | POA: Insufficient documentation

## 2015-03-07 DIAGNOSIS — G43909 Migraine, unspecified, not intractable, without status migrainosus: Secondary | ICD-10-CM | POA: Insufficient documentation

## 2015-03-07 DIAGNOSIS — I10 Essential (primary) hypertension: Secondary | ICD-10-CM | POA: Insufficient documentation

## 2015-03-07 DIAGNOSIS — Z79899 Other long term (current) drug therapy: Secondary | ICD-10-CM | POA: Insufficient documentation

## 2015-03-07 DIAGNOSIS — F329 Major depressive disorder, single episode, unspecified: Secondary | ICD-10-CM | POA: Insufficient documentation

## 2015-03-07 MED ORDER — OXYCODONE-ACETAMINOPHEN 5-325 MG PO TABS: 1.0000 | ORAL_TABLET | ORAL | Status: DC | PRN

## 2015-03-07 MED ORDER — PREDNISONE 10 MG (21) PO TBPK: 10.0000 mg | ORAL_TABLET | Freq: Every day | ORAL | Status: DC

## 2015-03-07 MED ORDER — IBUPROFEN 400 MG PO TABS
800.0000 mg | ORAL_TABLET | Freq: Once | ORAL | Status: AC
  Administered 2015-03-07: 800 mg via ORAL
  Filled 2015-03-07: qty 2

## 2015-03-07 MED ORDER — HYDROCODONE-ACETAMINOPHEN 5-325 MG PO TABS
1.0000 | ORAL_TABLET | Freq: Once | ORAL | Status: AC
  Administered 2015-03-07: 1 via ORAL
  Filled 2015-03-07: qty 1

## 2015-03-07 NOTE — ED Notes (Signed)
Pt reports L sided foot pain, onset Tuesday evening while at work. Pt states pain increased today, 7/10 at the time, increases with weight bearing.

## 2015-03-07 NOTE — ED Notes (Signed)
Declined W/C at D/C and was escorted to lobby by RN. 

## 2015-03-07 NOTE — Discharge Instructions (Signed)

## 2015-03-07 NOTE — ED Provider Notes (Signed)
CSN: [id number]     Arrival date & time 03/07/15  1441 History   This chart was scribed for non-physician practitioner, [facility] M. Janit Bern, NP, working with Pattricia Boss, MD, by Helane Gunther ED Scribe. This patient was seen in room TR10C/TR10C and the patient's care was started at 3:18 PM    Chief Complaint  Patient presents with  . Foot Pain   The history is provided by the patient. No language interpreter was used.   HPI Comments: Ashley Davis is a 49 y.o. female with a PMHx of HTN and Gout, who presents to the Emergency Department complaining of worsening left-sided foot pain onset 1 day ago. Pt reports associated swelling. She reports beginning use of insoles that day to relieve heel pain she had been experiencing for 2 weeks. When the left foot pain began, she removed the insoles, and the pain worsened progressively. Yesterday she applied a liniment, resting the foot and soaking it in epsom salts.This morning she reports the pain was much worse, and the swelling had increased. She has a PMHx of Gout, diagnosed about 15 years ago, for which she does not take any medication. She denies a fall or other injury.   Past Medical History  Diagnosis Date  . Asthma   . Migraine   . Depression   . Hypertension     Pt states her HTN is due to her migraines  . Stroke    Past Surgical History  Procedure Laterality Date  . No past surgeries     Family History  Problem Relation Age of Onset  . Diabetes Mother   . Stroke Mother   . Cancer Mother   . Headache Mother   . Diabetes Father   . Other Father     neuropathy  . Headache Sister   . Aneurysm Sister     3 brain surgeries   History  Substance Use Topics  . Smoking status: Former Smoker -- 0.25 packs/day for 30 years    Types: Cigarettes  . Smokeless tobacco: Never Used  . Alcohol Use: No   OB History    No data available     Review of Systems  Constitutional: Negative for fever.  Musculoskeletal:       Left foot pain   Skin: Negative for wound.  All other systems reviewed and are negative.   Allergies  Shellfish allergy  Home Medications   Prior to Admission medications   Medication Sig Start Date End Date Taking? Authorizing Provider  acetaminophen (TYLENOL) 500 MG tablet Take 1,000 mg by mouth every 6 (six) hours as needed for mild pain.    Historical Provider, MD  albuterol (PROVENTIL HFA;VENTOLIN HFA) 108 (90 BASE) MCG/ACT inhaler Inhale 2 puffs into the lungs every 6 (six) hours as needed for wheezing.    Historical Provider, MD  azithromycin (ZITHROMAX Z-PAK) 250 MG tablet Take 2 pills on day one then 1 tab days 2 through 5. 01/20/15   Elnora Morrison, MD  diphenhydrAMINE (BENADRYL) 25 MG tablet Take 50 mg by mouth daily as needed for allergies.     Historical Provider, MD  eletriptan (RELPAX) 40 MG tablet Take 1 tablet (40 mg total) by mouth as needed for migraine. May repeat in 2 hours if headache persists or recurs. 05/15/14   Penni Bombard, MD  loratadine-pseudoephedrine (CLARITIN-D 24-HOUR) 10-240 MG per 24 hr tablet Take 1 tablet by mouth daily.    Historical Provider, MD  naproxen sodium (ANAPROX) 220 MG tablet Take  440 mg by mouth 2 (two) times daily with a meal.    Historical Provider, MD  oxyCODONE-acetaminophen (ROXICET) 5-325 MG per tablet Take 1 tablet by mouth every 4 (four) hours as needed for severe pain. 03/07/15   Temple Ewart Bunnie Pion, NP  predniSONE (STERAPRED UNI-PAK 21 TAB) 10 MG (21) TBPK tablet Take 1 tablet (10 mg total) by mouth daily. Take 6 tablets PO today and then 5, 4, 3, 2, 1 03/07/15   Kagan Mutchler Bunnie Pion, NP  rizatriptan (MAXALT-MLT) 10 MG disintegrating tablet Take 1 tablet (10 mg total) by mouth as needed for migraine. May repeat in 2 hours if needed 06/06/14   Philmore Pali, NP  topiramate (TOPAMAX) 25 MG tablet Take 1 tablet at bedtime x 2 weeks and then increase to 2 tabs at  Bedtime. 06/07/14   Philmore Pali, NP   BP 139/91 mmHg  Pulse 81  Temp(Src) 98 F (36.7 C) (Oral)  Resp 20   SpO2 100%  LMP 02/21/2015 Physical Exam  Constitutional: She is oriented to person, place, and time. She appears well-developed and well-nourished. No distress.  HENT:  Head: Normocephalic and atraumatic.  Mouth/Throat: Oropharynx is clear and moist.  Eyes: Conjunctivae and EOM are normal. Pupils are equal, round, and reactive to light.  Neck: Normal range of motion. Neck supple. No tracheal deviation present.  Cardiovascular: Normal rate.   Pulmonary/Chest: Breath sounds normal. No respiratory distress.  Abdominal: Soft.  Musculoskeletal: Normal range of motion.  Pulses 2+ bilateral Normal sensation TTP on lateral aspect of left foot that radiates to ankle Pain on dorsiflexion and plantar flexion No erythema, or signs of infection.  Neurological: She is alert and oriented to person, place, and time.  Skin: Skin is warm and dry.  Psychiatric: She has a normal mood and affect. Her behavior is normal.  Nursing note and vitals reviewed.   ED Course  Procedures  DIAGNOSTIC STUDIES: Oxygen Saturation is 97% on RA, normal by my interpretation.    COORDINATION OF CARE: 3:23 PM - Discussed plans to order an x-ray of the left foot and pain medication. If the x-ray is normal, will refer to orthopedist or PCP for possible gout flare up. Pt advised of plan for treatment and pt agrees.  Labs Review Labs Reviewed - No data to display  Imaging Review Dg Foot Complete Left  03/07/2015   CLINICAL DATA:  Lateral left foot pain with inability to bear weight for the past 2 days; no known injury.  EXAM: LEFT FOOT - COMPLETE 3+ VIEW  COMPARISON:  Left foot series of October 12, 2007  FINDINGS: The bones of the left foot are adequately mineralized. There is no acute or healing fracture. There is no lytic or blastic bony lesion. The interphalangeal joints are preserved. There is minimal degenerative change of the first metatarsophalangeal joint which is stable. There is a plantar calcaneal spur. The  soft tissues exhibit no acute abnormalities.  IMPRESSION: There is no acute or significant chronic bony abnormality of the left foot. Specific attention to the fifth metatarsal reveals no acute abnormality.   Electronically Signed   By: David  Martinique M.D.   On: 03/07/2015 15:46     MDM  49 y.o. female with severe pain to the lateral aspect of the left foot radiating into the lateral ankle. Possible gout as x-rays are normal and patient has had similar episode with the right foot and dx with gout at that time. Will treat for pain and inflammation. She  will follow up with her PCP.   Final diagnoses:  Acute idiopathic gout of left foot   I personally performed the services described in this documentation, which was scribed in my presence. The recorded information has been reviewed and is accurate.    [address] [place], [place] 03/07/15 [place], MD 03/07/15 [phone]

## 2015-10-29 ENCOUNTER — Emergency Department (EMERGENCY_DEPARTMENT_HOSPITAL): Payer: BLUE CROSS/BLUE SHIELD

## 2015-10-29 ENCOUNTER — Emergency Department (HOSPITAL_COMMUNITY)
Admission: EM | Admit: 2015-10-29 | Discharge: 2015-10-29 | Disposition: A | Discharge status: Home / Self Care | Payer: BLUE CROSS/BLUE SHIELD | Attending: Emergency Medicine | Admitting: Emergency Medicine

## 2015-10-29 ENCOUNTER — Encounter (HOSPITAL_COMMUNITY): Payer: Self-pay

## 2015-10-29 ENCOUNTER — Emergency Department (HOSPITAL_COMMUNITY): Payer: BLUE CROSS/BLUE SHIELD

## 2015-10-29 DIAGNOSIS — M79605 Pain in left leg: Secondary | ICD-10-CM | POA: Diagnosis not present

## 2015-10-29 DIAGNOSIS — Z8673 Personal history of transient ischemic attack (TIA), and cerebral infarction without residual deficits: Secondary | ICD-10-CM | POA: Insufficient documentation

## 2015-10-29 DIAGNOSIS — J45909 Unspecified asthma, uncomplicated: Secondary | ICD-10-CM | POA: Insufficient documentation

## 2015-10-29 DIAGNOSIS — Z8659 Personal history of other mental and behavioral disorders: Secondary | ICD-10-CM | POA: Diagnosis not present

## 2015-10-29 DIAGNOSIS — Z87891 Personal history of nicotine dependence: Secondary | ICD-10-CM | POA: Diagnosis not present

## 2015-10-29 DIAGNOSIS — M79662 Pain in left lower leg: Secondary | ICD-10-CM | POA: Diagnosis present

## 2015-10-29 DIAGNOSIS — Z79899 Other long term (current) drug therapy: Secondary | ICD-10-CM | POA: Diagnosis not present

## 2015-10-29 DIAGNOSIS — I10 Essential (primary) hypertension: Secondary | ICD-10-CM | POA: Insufficient documentation

## 2015-10-29 DIAGNOSIS — M5126 Other intervertebral disc displacement, lumbar region: Secondary | ICD-10-CM | POA: Diagnosis not present

## 2015-10-29 DIAGNOSIS — G43909 Migraine, unspecified, not intractable, without status migrainosus: Secondary | ICD-10-CM | POA: Insufficient documentation

## 2015-10-29 MED ORDER — LORAZEPAM 2 MG/ML IJ SOLN
2.0000 mg | Freq: Once | INTRAMUSCULAR | Status: AC
  Administered 2015-10-29: 2 mg via INTRAMUSCULAR
  Filled 2015-10-29: qty 1

## 2015-10-29 MED ORDER — PREDNISONE 50 MG PO TABS: ORAL_TABLET | ORAL | Status: DC

## 2015-10-29 MED ORDER — OXYCODONE-ACETAMINOPHEN 5-325 MG PO TABS
1.0000 | ORAL_TABLET | ORAL | Status: DC | PRN
Start: 2015-10-29 — End: 2016-09-03

## 2015-10-29 MED ORDER — HYDROMORPHONE HCL 1 MG/ML IJ SOLN
1.0000 mg | Freq: Once | INTRAMUSCULAR | Status: AC
  Administered 2015-10-29: 1 mg via INTRAMUSCULAR
  Filled 2015-10-29: qty 1

## 2015-10-29 MED ORDER — NAPROXEN 500 MG PO TABS: 500.0000 mg | ORAL_TABLET | Freq: Two times a day (BID) | ORAL | Status: DC

## 2015-10-29 NOTE — Discharge Instructions (Signed)
Prescription for pain medication, prednisone, and anti-inflammatory medicine. My colleague will discuss the results of your MRI scan. Follow-up your primary care doctor.

## 2015-10-29 NOTE — ED Notes (Signed)
Pt complaint of constant heaviness, throbbing, and stiffness to LLE for 2 months. Pt states this pain is not the same as felt with stroke effecting LLE 2015.

## 2015-10-29 NOTE — ED Notes (Signed)
MD at bedside. 

## 2015-10-29 NOTE — ED Provider Notes (Signed)
CSN: [id number]     Arrival date & time 10/29/15  [id number] History   First MD Initiated Contact with Patient 10/29/15 1105     Chief Complaint  Patient presents with  . Leg Pain     (Consider location/radiation/quality/duration/timing/severity/associated sxs/prior Treatment) HPI...Marland KitchenMarland KitchenLeft lower extremity leg pain for approximately 2 months, getting worse past few 2 weeks. Pain appears to radiate down the medial aspect of her left leg. She is status post stroke a couple of years ago with residual left-sided arm and leg weakness, but this does not feel like that type of pain. No lower back tenderness. No fever, sweats, chills. No bowel or bladder incontinence. Patient feels she is dragging her leg.  Past Medical History  Diagnosis Date  . Asthma   . Migraine   . Depression   . Hypertension     Pt states her HTN is due to her migraines  . Stroke [facility])    Past Surgical History  Procedure Laterality Date  . No past surgeries     Family History  Problem Relation Age of Onset  . Diabetes Mother   . Stroke Mother   . Cancer Mother   . Headache Mother   . Diabetes Father   . Other Father     neuropathy  . Headache Sister   . Aneurysm Sister     3 brain surgeries   Social History  Substance Use Topics  . Smoking status: Former Smoker -- 0.25 packs/day for 30 years    Types: Cigarettes  . Smokeless tobacco: Never Used  . Alcohol Use: Yes     Comment: occ   OB History    No data available     Review of Systems  All other systems reviewed and are negative.     Allergies  Shellfish allergy  Home Medications   Prior to Admission medications   Medication Sig Start Date End Date Taking? Authorizing Provider  acetaminophen (TYLENOL) 500 MG tablet Take 500-1,500 mg by mouth every 6 (six) hours as needed for mild pain, moderate pain or fever.    Yes Historical Provider, MD  albuterol (PROVENTIL HFA;VENTOLIN HFA) 108 (90 BASE) MCG/ACT inhaler Inhale 2 puffs into the lungs every  6 (six) hours as needed for wheezing.   Yes Historical Provider, MD  aspirin-acetaminophen-caffeine (EXCEDRIN MIGRAINE) [phone] MG tablet Take 1-3 tablets by mouth every 6 (six) hours as needed for headache or migraine.   Yes Historical Provider, MD  diphenhydrAMINE (BENADRYL) 25 MG tablet Take 50 mg by mouth daily as needed for allergies.    Yes Historical Provider, MD  eletriptan (RELPAX) 40 MG tablet Take 1 tablet (40 mg total) by mouth as needed for migraine. May repeat in 2 hours if headache persists or recurs. 05/15/14  Yes Penni Bombard, MD  hydrochlorothiazide (MICROZIDE) 12.5 MG capsule Take 12.5 mg by mouth daily. 10/25/15  Yes Historical Provider, MD  naproxen sodium (ANAPROX) 220 MG tablet Take 22-440 mg by mouth 2 (two) times daily as needed (pain).    Yes Historical Provider, MD  naproxen (NAPROSYN) 500 MG tablet Take 1 tablet (500 mg total) by mouth 2 (two) times daily. 10/29/15   Nat Christen, MD  oxyCODONE-acetaminophen (PERCOCET) 5-325 MG tablet Take 1-2 tablets by mouth every 4 (four) hours as needed. 10/29/15   Nat Christen, MD  predniSONE (DELTASONE) 50 MG tablet 1 tablet daily for 7 days 10/29/15   Nat Christen, MD  rizatriptan (MAXALT-MLT) 10 MG disintegrating tablet Take 1 tablet (10 mg  total) by mouth as needed for migraine. May repeat in 2 hours if needed Patient not taking: Reported on 10/29/2015 06/06/14   Philmore Pali, NP  topiramate (TOPAMAX) 25 MG tablet Take 1 tablet at bedtime x 2 weeks and then increase to 2 tabs at  Bedtime. Patient not taking: Reported on 10/29/2015 06/07/14   Philmore Pali, NP   BP 125/81 mmHg  Pulse 56  Temp(Src) 98 F (36.7 C) (Oral)  Resp 18  SpO2 100%  LMP 10/24/2015 Physical Exam  Constitutional: She is oriented to person, place, and time. She appears well-developed and well-nourished.  HENT:  Head: Normocephalic and atraumatic.  Eyes: Conjunctivae and EOM are normal. Pupils are equal, round, and reactive to light.  Neck: Normal range of  motion. Neck supple.  Cardiovascular: Normal rate and regular rhythm.   Pulmonary/Chest: Effort normal and breath sounds normal.  Abdominal: Soft. Bowel sounds are normal.  Musculoskeletal: Normal range of motion.  No lumbar tenderness.  Neurological: She is alert and oriented to person, place, and time.  Patient is ambulatory but appears to favor her left leg. Pain with straight leg raise.  Skin: Skin is warm and dry.  Psychiatric: She has a normal mood and affect. Her behavior is normal.  Nursing note and vitals reviewed.   ED Course  Procedures (including critical care time) Labs Review Labs Reviewed - No data to display  Imaging Review No results found. I have personally reviewed and evaluated these images and lab results as part of my medical decision-making.   EKG Interpretation None      MDM   Final diagnoses:  Left leg pain    Doppler study of left lower extremity shows no DVT. MRI of lumbar spine pending. Discussed with Dr.Schlossman.  She will follow-up with MRI of lumbar spine.    Nat Christen, MD 10/29/15 1536

## 2015-10-29 NOTE — ED Notes (Signed)
Vascular technician at bedside 

## 2015-10-29 NOTE — ED Notes (Addendum)
Pt c/o LLE pain x 2 months.  Pain score 9/10.  Denies injury.  Pt reports taking Excedrin w/o relief.  Pt reports "it feels like a toothache."  Sts it starts from mid calf and radiates up into knee.  Pt reports previous stroke that effected L side.  Sts she stands on her feet for work.

## 2015-10-29 NOTE — ED Notes (Signed)
MRI report will be at bedside in approximately 1.5 hours; MRI reports will call 15 minutes prior to arrival so staff can administer ativan for anxiety as ordered.

## 2015-10-29 NOTE — Progress Notes (Signed)
*  PRELIMINARY RESULTS* Vascular Ultrasound Left lower extremity venous duplex has been completed.  Preliminary findings: No evidence of DVT in visualized veins or baker's cyst.   Landry Mellow, RDMS, RVT  10/29/2015, 12:48 PM

## 2016-03-18 DIAGNOSIS — Z720 Tobacco use: Secondary | ICD-10-CM | POA: Diagnosis not present

## 2016-03-18 DIAGNOSIS — I10 Essential (primary) hypertension: Secondary | ICD-10-CM | POA: Diagnosis not present

## 2016-03-18 DIAGNOSIS — E669 Obesity, unspecified: Secondary | ICD-10-CM | POA: Diagnosis not present

## 2016-03-31 DIAGNOSIS — I10 Essential (primary) hypertension: Secondary | ICD-10-CM | POA: Diagnosis not present

## 2016-08-14 ENCOUNTER — Encounter: Payer: BLUE CROSS/BLUE SHIELD | Admitting: Podiatry

## 2016-08-18 DIAGNOSIS — G629 Polyneuropathy, unspecified: Secondary | ICD-10-CM | POA: Diagnosis not present

## 2016-08-18 DIAGNOSIS — M5116 Intervertebral disc disorders with radiculopathy, lumbar region: Secondary | ICD-10-CM | POA: Diagnosis not present

## 2016-08-29 NOTE — Progress Notes (Signed)
This encounter was created in error - please disregard.

## 2016-09-03 ENCOUNTER — Ambulatory Visit (INDEPENDENT_AMBULATORY_CARE_PROVIDER_SITE_OTHER): Payer: BLUE CROSS/BLUE SHIELD

## 2016-09-03 ENCOUNTER — Encounter: Payer: Self-pay | Admitting: Podiatry

## 2016-09-03 ENCOUNTER — Ambulatory Visit (INDEPENDENT_AMBULATORY_CARE_PROVIDER_SITE_OTHER): Payer: BLUE CROSS/BLUE SHIELD | Admitting: Podiatry

## 2016-09-03 VITALS — BP 152/101 | HR 78 | Resp 16 | Ht 70.0 in | Wt 286.0 lb

## 2016-09-03 DIAGNOSIS — M79671 Pain in right foot: Secondary | ICD-10-CM

## 2016-09-03 DIAGNOSIS — D169 Benign neoplasm of bone and articular cartilage, unspecified: Secondary | ICD-10-CM | POA: Diagnosis not present

## 2016-09-03 DIAGNOSIS — M79672 Pain in left foot: Secondary | ICD-10-CM | POA: Diagnosis not present

## 2016-09-03 DIAGNOSIS — M779 Enthesopathy, unspecified: Secondary | ICD-10-CM | POA: Diagnosis not present

## 2016-09-03 MED ORDER — TRIAMCINOLONE ACETONIDE 10 MG/ML IJ SUSP
10.0000 mg | Freq: Once | INTRAMUSCULAR | Status: AC
  Administered 2016-09-03: 10 mg

## 2016-09-03 NOTE — Progress Notes (Signed)
   Subjective:    Patient ID: Ashley Davis, female    DOB: 18-Mar-1966, 51 y.o.   MRN: [id number]  HPI Chief Complaint  Patient presents with  . Foot Pain    Bilateral; numbness in great toes; Right foot; lump on dorsal; Back of heel; pt stated, "Has shooting pain from great toe to top of foot for past 3 yrs"; Left foot; lateral side; pt stated, "Pain comes and goes since Feb. 2017"      Review of Systems  Constitutional: Positive for activity change and fatigue.  HENT: Positive for sinus pressure and sneezing.   Respiratory: Positive for chest tightness and shortness of breath.   Endocrine: Positive for polyphagia and polyuria.  Genitourinary: Positive for frequency and urgency.  Musculoskeletal: Positive for arthralgias, back pain and myalgias.  Neurological: Positive for numbness.  All other systems reviewed and are negative.      Objective:   Physical Exam        Assessment & Plan:

## 2016-09-03 NOTE — Progress Notes (Signed)
Subjective:     Patient ID: Ashley Davis, female   DOB: 1965-12-31, 51 y.o.   MRN: [id number]  HPI patient presents stating that she's had a lot of pain on top of her right foot and also some numbness in the big toes bilateral and mild discomfort on the left foot. States it's been present for a fairly long time and she's getting ready to go on a cruise and she cannot wear shoe gear comfortably   Review of Systems  All other systems reviewed and are negative.      Objective:   Physical Exam  Constitutional: She is oriented to person, place, and time.  Cardiovascular: Intact distal pulses.   Musculoskeletal: Normal range of motion.  Neurological: She is oriented to person, place, and time.  Skin: Skin is warm.  Nursing note and vitals reviewed.  Neurovascular status intact muscle strength adequate range of motion was within normal limits with patient found to have mild numbness around the hallux bilateral with history of back issues. On the dorsum of the right foot I did note there is spur formation with pain around the midtarsal joint with fluid buildup and I did note slight discomfort on the left foot localized in nature. Patient's found have good digital perfusion and is well oriented 3     Assessment:     Dorsal spur first metatarsal right consistent with possibility for arthritis or reactive bone formation. Mild neuropathy bilateral which is most likely related to back issues    Plan:     H&P and x-rays of both feet reviewed. Today I went ahead and for the right foot I injected the dorsal complex 3 mg Kenalog 5 mill grams Xylocaine and I will see back in 3 weeks to review and at one point excision of spurring may be necessary and at one point in future it may require fusion technique. I then we will see back in 3 weeks  X-ray report indicated that there is spurring of the dorsum of the right foot with inflammation and mild depression of the arch bilateral

## 2016-09-03 NOTE — Patient Instructions (Signed)

## 2016-09-19 ENCOUNTER — Emergency Department (HOSPITAL_COMMUNITY): Payer: BLUE CROSS/BLUE SHIELD

## 2016-09-19 ENCOUNTER — Encounter (HOSPITAL_COMMUNITY): Payer: Self-pay | Admitting: *Deleted

## 2016-09-19 ENCOUNTER — Emergency Department (HOSPITAL_COMMUNITY)
Admission: EM | Admit: 2016-09-19 | Discharge: 2016-09-19 | Disposition: A | Discharge status: Home / Self Care | Payer: BLUE CROSS/BLUE SHIELD | Attending: Emergency Medicine | Admitting: Emergency Medicine

## 2016-09-19 DIAGNOSIS — Z7982 Long term (current) use of aspirin: Secondary | ICD-10-CM | POA: Diagnosis not present

## 2016-09-19 DIAGNOSIS — R05 Cough: Secondary | ICD-10-CM | POA: Diagnosis present

## 2016-09-19 DIAGNOSIS — Z8673 Personal history of transient ischemic attack (TIA), and cerebral infarction without residual deficits: Secondary | ICD-10-CM | POA: Insufficient documentation

## 2016-09-19 DIAGNOSIS — Z79899 Other long term (current) drug therapy: Secondary | ICD-10-CM | POA: Diagnosis not present

## 2016-09-19 DIAGNOSIS — Z87891 Personal history of nicotine dependence: Secondary | ICD-10-CM | POA: Insufficient documentation

## 2016-09-19 DIAGNOSIS — J019 Acute sinusitis, unspecified: Secondary | ICD-10-CM | POA: Diagnosis not present

## 2016-09-19 DIAGNOSIS — I10 Essential (primary) hypertension: Secondary | ICD-10-CM | POA: Diagnosis not present

## 2016-09-19 DIAGNOSIS — J45909 Unspecified asthma, uncomplicated: Secondary | ICD-10-CM | POA: Insufficient documentation

## 2016-09-19 DIAGNOSIS — R0602 Shortness of breath: Secondary | ICD-10-CM | POA: Diagnosis not present

## 2016-09-19 MED ORDER — SALINE SPRAY 0.65 % NA SOLN: 1.0000 | NASAL | 0 refills | Status: DC | PRN

## 2016-09-19 MED ORDER — AMOXICILLIN-POT CLAVULANATE 875-125 MG PO TABS: 1.0000 | ORAL_TABLET | Freq: Two times a day (BID) | ORAL | 0 refills | Status: DC

## 2016-09-19 MED ORDER — BENZONATATE 100 MG PO CAPS: 200.0000 mg | ORAL_CAPSULE | Freq: Two times a day (BID) | ORAL | 0 refills | Status: DC | PRN

## 2016-09-19 NOTE — ED Provider Notes (Signed)
[facility] DEPT Provider Note   CSN: [id number] Arrival date & time: 09/19/16  1041  By signing my name below, I, Ashley Davis, attest that this documentation has been prepared under the direction and in the presence of Montine Circle, PA-C. Electronically Signed: Sonum Davis, Education administrator. 09/19/16. 12:36 PM.  History   Chief Complaint Chief Complaint  Patient presents with  . URI    The history is provided by the patient. No language interpreter was used.     HPI Comments: Ashley Davis is a 51 y.o. female who presents to the Emergency Department complaining of 1 week of a persistent cough that is now productive of green sputum. She has associated congestion and sinus pressure. She has tried OTC medications without significant relief. She denies fever, SOB, CP.   Past Medical History:  Diagnosis Date  . Asthma   . Depression   . Hypertension    Pt states her HTN is due to her migraines  . Migraine   . Stroke [facility])     Patient Active Problem List   Diagnosis Date Noted  . Complicated migraine [id number]  . Asthma, chronic 04/27/2014  . Hemiplegia, unspecified, affecting nondominant side 04/26/2014  . Weakness 04/24/2014    Past Surgical History:  Procedure Laterality Date  . NO PAST SURGERIES      OB History    No data available       Home Medications    Prior to Admission medications   Medication Sig Start Date End Date Taking? Authorizing Provider  albuterol (PROVENTIL HFA;VENTOLIN HFA) 108 (90 BASE) MCG/ACT inhaler Inhale 2 puffs into the lungs every 6 (six) hours as needed for wheezing.    Historical Provider, MD  aspirin-acetaminophen-caffeine (EXCEDRIN MIGRAINE) [phone] MG tablet Take 1-3 tablets by mouth every 6 (six) hours as needed for headache or migraine.    Historical Provider, MD  diphenhydrAMINE (BENADRYL) 25 MG tablet Take 50 mg by mouth daily as needed for allergies.     Historical Provider, MD  hydrochlorothiazide (MICROZIDE) 12.5 MG  capsule Take 12.5 mg by mouth daily. 10/25/15   Historical Provider, MD    Family History Family History  Problem Relation Age of Onset  . Diabetes Mother   . Stroke Mother   . Cancer Mother   . Headache Mother   . Diabetes Father   . Other Father     neuropathy  . Headache Sister   . Aneurysm Sister     3 brain surgeries    Social History Social History  Substance Use Topics  . Smoking status: Former Smoker    Packs/day: 0.25    Years: 30.00    Types: Cigarettes  . Smokeless tobacco: Never Used  . Alcohol use Yes     Comment: occ     Allergies   Shellfish allergy   Review of Systems Review of Systems  Constitutional: Negative for fever.  HENT: Positive for congestion and sinus pressure.   Respiratory: Positive for cough. Negative for shortness of breath.   Cardiovascular: Negative for chest pain.     Physical Exam Updated Vital Signs BP (!) 172/116 (BP Location: Left Arm)   Pulse 83   Temp 99 F (37.2 C) (Oral)   Resp 18   Ht 5' 10.5" (1.791 m)   Wt 286 lb (129.7 kg)   SpO2 98%   BMI 40.46 kg/m   Physical Exam  Physical Exam  Constitutional: Pt  is oriented to person, place, and time. Appears well-developed and well-nourished.  No distress.  HENT:  Head: Normocephalic and atraumatic.  Right Ear: Tympanic membrane, external ear and ear canal normal.  Left Ear: Tympanic membrane, external ear and ear canal normal.  Nose: Mucosal edema and moderate rhinorrhea present. No epistaxis. Patient exhibits moderate maxillary sinus tenderness and frontal sinus tenderness.   Mouth/Throat: Uvula is midline and mucous membranes are normal. Mucous membranes are not pale and not cyanotic. No oropharyngeal exudate, posterior oropharyngeal edema, posterior oropharyngeal erythema or tonsillar abscesses.  Eyes: Conjunctivae are normal. Pupils are equal, round, and reactive to light.  Neck: Normal range of motion and full passive range of motion without pain.    Cardiovascular: Normal rate and intact distal pulses.   Pulmonary/Chest: Effort normal and breath sounds normal. No stridor.  Clear and equal breath sounds without focal wheezes, rhonchi, rales  Abdominal: Soft. Bowel sounds are normal. There is no tenderness.  Musculoskeletal: Normal range of motion.  Lymphadenopathy:    Pthas no cervical adenopathy.  Neurological: Pt is alert and oriented to person, place, and time.  Skin: Skin is warm and dry. No rash noted. Pt is not diaphoretic.  Psychiatric: Normal mood and affect.  Nursing note and vitals reviewed.   ED Treatments / Results  DIAGNOSTIC STUDIES: Oxygen Saturation is 98% on RA, normal by my interpretation.    COORDINATION OF CARE: 12:39 PM Discussed treatment plan with pt at bedside and pt agreed to plan.   Labs (all labs ordered are listed, but only abnormal results are displayed) Labs Reviewed - No data to display  EKG  EKG Interpretation None       Radiology Dg Chest 2 View  Result Date: 09/19/2016 CLINICAL DATA:  Smoking history. Chronic bronchitis. Wheezing and shortness of breath. EXAM: CHEST  2 VIEW COMPARISON:  01/20/2015 FINDINGS: Heart size is normal. Some tortuosity of the aorta. The lungs are clear. The pulmonary vascularity is normal. No effusions. No significant bone finding. IMPRESSION: No active cardiopulmonary disease. Electronically Signed   By: Nelson Chimes M.D.   On: 09/19/2016 12:48    Procedures Procedures (including critical care time)  Medications Ordered in ED Medications - No data to display   Initial Impression / Assessment and Plan / ED Course  I have reviewed the triage vital signs and the nursing notes.  Pertinent labs & imaging results that were available during my care of the patient were reviewed by me and considered in my medical decision making (see chart for details).     Pt symptoms consistent with URI. CXR negative for acute infiltrate. Pt will be discharged with Augmentin  for sinusitis treatment.  Discussed return precautions.  Pt is hemodynamically stable & in NAD prior to discharge.   Final Clinical Impressions(s) / ED Diagnoses   Final diagnoses:  Acute sinusitis, recurrence not specified, unspecified location    New Prescriptions Discharge Medication List as of 09/19/2016 12:55 PM    START taking these medications   Details  amoxicillin-clavulanate (AUGMENTIN) 875-125 MG tablet Take 1 tablet by mouth every 12 (twelve) hours., Starting Fri 09/19/2016, Print    benzonatate (TESSALON) 100 MG capsule Take 2 capsules (200 mg total) by mouth 2 (two) times daily as needed for cough., Starting Fri 09/19/2016, Print       I personally performed the services described in this documentation, which was scribed in my presence. The recorded information has been reviewed and is accurate.      Montine Circle, PA-C 09/19/16 [place], MD 09/25/16 2325

## 2016-09-19 NOTE — ED Triage Notes (Signed)
Pt developed URI on the 12th with a cough, on vacation. Pt HTN and has history of same. States she is coughing thick green at present

## 2016-09-20 ENCOUNTER — Emergency Department (HOSPITAL_COMMUNITY)
Admission: EM | Admit: 2016-09-20 | Discharge: 2016-09-20 | Disposition: A | Discharge status: Home / Self Care | Payer: BLUE CROSS/BLUE SHIELD | Attending: Emergency Medicine | Admitting: Emergency Medicine

## 2016-09-20 ENCOUNTER — Encounter (HOSPITAL_COMMUNITY): Payer: Self-pay

## 2016-09-20 ENCOUNTER — Emergency Department (HOSPITAL_COMMUNITY): Payer: BLUE CROSS/BLUE SHIELD

## 2016-09-20 DIAGNOSIS — T50905A Adverse effect of unspecified drugs, medicaments and biological substances, initial encounter: Secondary | ICD-10-CM

## 2016-09-20 DIAGNOSIS — J45909 Unspecified asthma, uncomplicated: Secondary | ICD-10-CM | POA: Diagnosis not present

## 2016-09-20 DIAGNOSIS — R0789 Other chest pain: Secondary | ICD-10-CM | POA: Insufficient documentation

## 2016-09-20 DIAGNOSIS — Z79899 Other long term (current) drug therapy: Secondary | ICD-10-CM | POA: Insufficient documentation

## 2016-09-20 DIAGNOSIS — Z8673 Personal history of transient ischemic attack (TIA), and cerebral infarction without residual deficits: Secondary | ICD-10-CM | POA: Insufficient documentation

## 2016-09-20 DIAGNOSIS — R002 Palpitations: Secondary | ICD-10-CM | POA: Diagnosis not present

## 2016-09-20 DIAGNOSIS — T887XXA Unspecified adverse effect of drug or medicament, initial encounter: Secondary | ICD-10-CM | POA: Insufficient documentation

## 2016-09-20 DIAGNOSIS — Z7982 Long term (current) use of aspirin: Secondary | ICD-10-CM | POA: Insufficient documentation

## 2016-09-20 DIAGNOSIS — R079 Chest pain, unspecified: Secondary | ICD-10-CM | POA: Diagnosis not present

## 2016-09-20 DIAGNOSIS — Y69 Unspecified misadventure during surgical and medical care: Secondary | ICD-10-CM | POA: Diagnosis not present

## 2016-09-20 DIAGNOSIS — I10 Essential (primary) hypertension: Secondary | ICD-10-CM | POA: Insufficient documentation

## 2016-09-20 DIAGNOSIS — Z87891 Personal history of nicotine dependence: Secondary | ICD-10-CM | POA: Diagnosis not present

## 2016-09-20 DIAGNOSIS — T360X5A Adverse effect of penicillins, initial encounter: Secondary | ICD-10-CM | POA: Insufficient documentation

## 2016-09-20 LAB — BASIC METABOLIC PANEL
Anion gap: 9 (ref 5–15)
BUN: 13 mg/dL (ref 6–20)
CALCIUM: 8.6 mg/dL — AB (ref 8.9–10.3)
CHLORIDE: 101 mmol/L (ref 101–111)
CO2: 27 mmol/L (ref 22–32)
CREATININE: 0.64 mg/dL (ref 0.44–1.00)
GFR calc Af Amer: >60 mL/min (ref >60)
Glucose, Bld: 131 mg/dL — ABNORMAL HIGH (ref 65–99)
POTASSIUM: 3.2 mmol/L — AB (ref 3.5–5.1)
Sodium: 137 mmol/L (ref 135–145)

## 2016-09-20 LAB — CBC WITH DIFFERENTIAL/PLATELET
Basophils Absolute: 0 10*3/uL (ref 0.0–0.1)
Basophils Relative: 0 %
EOS PCT: 5 %
Eosinophils Absolute: 0.3 10*3/uL (ref 0.0–0.7)
HCT: 37.2 % (ref 36.0–46.0)
HEMOGLOBIN: 12.8 g/dL (ref 12.0–15.0)
LYMPHS ABS: 2.1 10*3/uL (ref 0.7–4.0)
LYMPHS PCT: 40 %
MCH: 30.1 pg (ref 26.0–34.0)
MCHC: 34.4 g/dL (ref 30.0–36.0)
MCV: 87.5 fL (ref 78.0–100.0)
Monocytes Absolute: 0.7 10*3/uL (ref 0.1–1.0)
Monocytes Relative: 13 %
Neutro Abs: 2.3 10*3/uL (ref 1.7–7.7)
Neutrophils Relative %: 42 %
PLATELETS: 237 10*3/uL (ref 150–400)
RBC: 4.25 MIL/uL (ref 3.87–5.11)
RDW: 14 % (ref 11.5–15.5)
WBC: 5.4 10*3/uL (ref 4.0–10.5)

## 2016-09-20 LAB — I-STAT TROPONIN, ED: TROPONIN I, POC: 0.02 ng/mL (ref 0.00–0.08)

## 2016-09-20 MED ORDER — DOXYCYCLINE HYCLATE 100 MG PO CAPS
100.0000 mg | ORAL_CAPSULE | Freq: Two times a day (BID) | ORAL | 0 refills | Status: DC
Start: 2016-09-20 — End: 2017-04-30

## 2016-09-20 MED ORDER — OXYMETAZOLINE HCL 0.05 % NA SOLN: 1.0000 | Freq: Two times a day (BID) | NASAL | 0 refills | Status: DC

## 2016-09-20 NOTE — ED Notes (Signed)
Gave EKG to Dr. Gilford Raid, MD for review.

## 2016-09-20 NOTE — ED Provider Notes (Signed)
[facility] DEPT Provider Note   CSN: [id number] Arrival date & time: 09/20/16  1648  By signing my name below, I, Jeanell Sparrow, attest that this documentation has been prepared under the direction and in the presence of non-physician practitioner, Harlene Ramus, PA. Electronically Signed: Jeanell Sparrow, Scribe. 09/20/2016. 9:23 PM.  History   Chief Complaint Chief Complaint  Patient presents with  . Palpitations   The history is provided by the patient. No language interpreter was used.    HPI Comments: Ashley Davis is a 51 y.o. female who presents to the Emergency Department complaining of intermittent heart palpitations that started yesterday. She came to the ED for productive cough, had a negative chest x-ray, and sent home with Augmentin for sinus infection. She states she has been having episodes of increased heart rate and chest pressure, lasting about an hour, within minutes after taking Augmentin. She had 3 doses total and her last dose was about 6.5 hours ago. She currently has no chest pain/pressure or palpitations, only associated generalized weakness and nasal congestion. She denies any hx of cardiac problems, fever, headache, visual disturbance, throat/lip swelling, SOB, rash, abdominal pain, vomiting, or other complaints.     PCP: Maude Leriche, PA-C  Past Medical History:  Diagnosis Date  . Asthma   . Depression   . Hypertension    Pt states her HTN is due to her migraines  . Migraine   . Stroke [facility])     Patient Active Problem List   Diagnosis Date Noted  . Complicated migraine [id number]  . Asthma, chronic 04/27/2014  . Hemiplegia, unspecified, affecting nondominant side 04/26/2014  . Weakness 04/24/2014    Past Surgical History:  Procedure Laterality Date  . NO PAST SURGERIES      OB History    No data available       Home Medications    Prior to Admission medications   Medication Sig Start Date End Date Taking? Authorizing Provider    albuterol (PROVENTIL HFA;VENTOLIN HFA) 108 (90 BASE) MCG/ACT inhaler Inhale 2 puffs into the lungs every 6 (six) hours as needed for wheezing.   Yes Historical Provider, MD  amoxicillin-clavulanate (AUGMENTIN) 875-125 MG tablet Take 1 tablet by mouth every 12 (twelve) hours. 09/19/16  Yes Montine Circle, PA-C  aspirin-acetaminophen-caffeine (EXCEDRIN MIGRAINE) [phone] MG tablet Take 1-3 tablets by mouth every 6 (six) hours as needed for headache or migraine.   Yes Historical Provider, MD  benzonatate (TESSALON) 100 MG capsule Take 2 capsules (200 mg total) by mouth 2 (two) times daily as needed for cough. 09/19/16  Yes Montine Circle, PA-C  hydrochlorothiazide (MICROZIDE) 12.5 MG capsule Take 12.5 mg by mouth daily. 10/25/15  Yes Historical Provider, MD  sodium chloride (OCEAN) 0.65 % SOLN nasal spray Place 1 spray into both nostrils as needed for congestion. 09/19/16  Yes Montine Circle, PA-C  diphenhydrAMINE (BENADRYL) 25 MG tablet Take 50 mg by mouth daily as needed for allergies.     Historical Provider, MD  doxycycline (VIBRAMYCIN) 100 MG capsule Take 1 capsule (100 mg total) by mouth 2 (two) times daily. 09/20/16   Nona Dell, PA-C  oxymetazoline (AFRIN NASAL SPRAY) 0.05 % nasal spray Place 1 spray into both nostrils 2 (two) times daily. Spray once into each nostril twice daily for up to the next 3 days. Do not use for more than 3 days to prevent rebound rhinorrhea. 09/20/16   Nona Dell, PA-C    Family History Family History  Problem Relation Age  of Onset  . Diabetes Mother   . Stroke Mother   . Cancer Mother   . Headache Mother   . Diabetes Father   . Other Father     neuropathy  . Headache Sister   . Aneurysm Sister     3 brain surgeries    Social History Social History  Substance Use Topics  . Smoking status: Former Smoker    Packs/day: 0.25    Years: 30.00    Types: Cigarettes  . Smokeless tobacco: Never Used  . Alcohol use Yes     Comment:  occ     Allergies   Shellfish allergy   Review of Systems Review of Systems  Constitutional: Negative for fever.  HENT: Negative for facial swelling and trouble swallowing.   Eyes: Negative for visual disturbance.  Cardiovascular: Positive for chest pain (Pressure) and palpitations (Increased heart rate).  Gastrointestinal: Negative for abdominal pain and vomiting.  Neurological: Positive for weakness (Generalized).  All other systems reviewed and are negative.    Physical Exam Updated Vital Signs BP 127/81 (BP Location: Left Arm)   Pulse 90   Temp 98.3 F (36.8 C) (Oral)   Resp 16   Ht 5' 10.5" (1.791 m)   Wt 286 lb (129.7 kg)   LMP 08/28/2016   SpO2 100%   BMI 40.46 kg/m   Physical Exam  Constitutional: She is oriented to person, place, and time. She appears well-developed and well-nourished. No distress.  HENT:  Head: Normocephalic and atraumatic.  Nose: Rhinorrhea present. Right sinus exhibits no maxillary sinus tenderness and no frontal sinus tenderness. Left sinus exhibits no maxillary sinus tenderness and no frontal sinus tenderness.  Mouth/Throat: Uvula is midline, oropharynx is clear and moist and mucous membranes are normal. No oropharyngeal exudate, posterior oropharyngeal edema, posterior oropharyngeal erythema or tonsillar abscesses. No tonsillar exudate.  No facial or neck swelling. No swelling of the lips or tongue. Floor of mouth is soft.   Eyes: Conjunctivae and EOM are normal. Right eye exhibits no discharge. Left eye exhibits no discharge. No scleral icterus.  Neck: Normal range of motion. Neck supple.  Cardiovascular: Normal rate, regular rhythm, normal heart sounds and intact distal pulses.   Heart rate was 88.   Pulmonary/Chest: Effort normal and breath sounds normal. No stridor. No respiratory distress. She has no wheezes. She has no rales. She exhibits no tenderness.  Abdominal: Soft. Bowel sounds are normal. She exhibits no distension and no  mass. There is no tenderness. There is no rebound and no guarding. No hernia.  Musculoskeletal: Normal range of motion. She exhibits no edema.  Neurological: She is alert and oriented to person, place, and time.  Skin: Skin is warm and dry. No rash noted. She is not diaphoretic.  Nursing note and vitals reviewed.    ED Treatments / Results  DIAGNOSTIC STUDIES: Oxygen Saturation is 100% on RA, normal by my interpretation.    COORDINATION OF CARE: 9:27 PM- Pt advised of plan for treatment and pt agrees.  Labs (all labs ordered are listed, but only abnormal results are displayed) Labs Reviewed  BASIC METABOLIC PANEL - Abnormal; Notable for the following:       Result Value   Potassium 3.2 (*)    Glucose, Bld 131 (*)    Calcium 8.6 (*)    All other components within normal limits  CBC WITH DIFFERENTIAL/PLATELET  Randolm Idol, ED    EKG  EKG Interpretation  Date/Time:  Saturday September 20 2016 17:05:04 EST  Ventricular Rate:  99 PR Interval:    QRS Duration: 100 QT Interval:  352 QTC Calculation: 452 R Axis:   4 Text Interpretation:  Sinus rhythm Left atrial enlargement Confirmed by Gilford Raid MD, JULIE ([id number]) on 09/20/2016 9:40:20 PM       Radiology Dg Chest 2 View  Result Date: 09/20/2016 CLINICAL DATA:  Chest pain and palpitations. EXAM: CHEST  2 VIEW COMPARISON:  Radiographs yesterday. FINDINGS: The cardiomediastinal contours are unchanged, heart is normal in size. The lungs are clear. Pulmonary vasculature is normal. No consolidation, pleural effusion, or pneumothorax. No acute osseous abnormalities are seen. IMPRESSION: No active cardiopulmonary disease. Electronically Signed   By: Jeb Levering M.D.   On: 09/20/2016 22:23   Dg Chest 2 View  Result Date: 09/19/2016 CLINICAL DATA:  Smoking history. Chronic bronchitis. Wheezing and shortness of breath. EXAM: CHEST  2 VIEW COMPARISON:  01/20/2015 FINDINGS: Heart size is normal. Some tortuosity of the aorta. The  lungs are clear. The pulmonary vascularity is normal. No effusions. No significant bone finding. IMPRESSION: No active cardiopulmonary disease. Electronically Signed   By: Nelson Chimes M.D.   On: 09/19/2016 12:48    Procedures Procedures (including critical care time)  Medications Ordered in ED Medications - No data to display   Initial Impression / Assessment and Plan / ED Course  I have reviewed the triage vital signs and the nursing notes.  Pertinent labs & imaging results that were available during my care of the patient were reviewed by me and considered in my medical decision making (see chart for details).     Patient presents with episodes of palpitations and chest pressure that lasted for approximately an hour after taking Augmentin which she was prescribed yesterday for sinus infection. She reports taking 3 doses and notes the symptoms have occurred after each dose but resolve within the following hour. Denies taking any medications for symptoms. Denies any complaints at this time. VSS. Exam unremarkable. We'll order basic labs, chest x-ray and EKG for evaluation of chest pressure/palpitations. EKG showed sinus rhythm with no acute ischemic changes. Troponin negative. Chest x-ray negative. Remaining labs revealed potassium 3.2, otherwise unremarkable. Patient given oral potassium supplement in the ED and advised to follow-up with her PCP in the next week to have her potassium level rechecked.  Patient re-evaluated prior to dc, is hemodynamically stable, in no respiratory distress, and denies the feeling of throat closing. Pt has been advised to stop taking Augmentin due to suspected drug allergy, given new rx for doxycyline for sinusitis & advised to return to the ED if they have a mod-severe allergic rxn (s/s including throat closing, difficulty breathing, swelling of lips face or tongue). Pt is to follow up with their PCP. Pt is agreeable with plan & verbalizes understanding.   Final  Clinical Impressions(s) / ED Diagnoses   Final diagnoses:  Adverse effect of drug, initial encounter    New Prescriptions New Prescriptions   DOXYCYCLINE (VIBRAMYCIN) 100 MG CAPSULE    Take 1 capsule (100 mg total) by mouth 2 (two) times daily.   OXYMETAZOLINE (AFRIN NASAL SPRAY) 0.05 % NASAL SPRAY    Place 1 spray into both nostrils 2 (two) times daily. Spray once into each nostril twice daily for up to the next 3 days. Do not use for more than 3 days to prevent rebound rhinorrhea.   I personally performed the services described in this documentation, which was scribed in my presence. The recorded information has been reviewed and is  accurate.     Chesley Noon [place], [place] 09/20/16 2329    Isla Pence, MD 09/21/16 [phone]

## 2016-09-20 NOTE — Discharge Instructions (Signed)
I recommend she stop taking her prescription of Augmentin due to year drug allergy. I recommend taking her new prescriptions as prescribed. Follow-up with your primary care doctor within the next week for follow-up evaluation and also to have your blood rechecked due to a decreased potassium noted on your blood work in the ED tonight (potassium 3.2). Please return to the Emergency Department if symptoms worsen or new onset of fever, headache, lightheadedness, facial swelling, swelling of lips/tongue/mouth, difficulty breathing, shortness of breath, chest pain, wheezing, abdominal pain, vomiting, rash.

## 2016-09-20 NOTE — ED Triage Notes (Signed)
Patient c/o heart palpitations after taking Augmentin x 2. Patient states she was taking the medication for sinusitis.

## 2016-09-22 ENCOUNTER — Ambulatory Visit: Payer: BLUE CROSS/BLUE SHIELD | Admitting: Podiatry

## 2016-10-08 DIAGNOSIS — M5416 Radiculopathy, lumbar region: Secondary | ICD-10-CM | POA: Diagnosis not present

## 2016-10-16 DIAGNOSIS — M5416 Radiculopathy, lumbar region: Secondary | ICD-10-CM | POA: Diagnosis not present

## 2016-10-16 DIAGNOSIS — M545 Low back pain: Secondary | ICD-10-CM | POA: Diagnosis not present

## 2016-10-16 DIAGNOSIS — D259 Leiomyoma of uterus, unspecified: Secondary | ICD-10-CM | POA: Diagnosis not present

## 2016-12-01 DIAGNOSIS — R109 Unspecified abdominal pain: Secondary | ICD-10-CM | POA: Diagnosis not present

## 2016-12-01 DIAGNOSIS — I10 Essential (primary) hypertension: Secondary | ICD-10-CM | POA: Diagnosis not present

## 2016-12-01 DIAGNOSIS — E876 Hypokalemia: Secondary | ICD-10-CM | POA: Diagnosis not present

## 2016-12-29 DIAGNOSIS — D251 Intramural leiomyoma of uterus: Secondary | ICD-10-CM | POA: Diagnosis not present

## 2016-12-29 DIAGNOSIS — N939 Abnormal uterine and vaginal bleeding, unspecified: Secondary | ICD-10-CM | POA: Diagnosis not present

## 2016-12-29 DIAGNOSIS — R102 Pelvic and perineal pain: Secondary | ICD-10-CM | POA: Diagnosis not present

## 2016-12-31 ENCOUNTER — Other Ambulatory Visit: Payer: Self-pay | Admitting: Obstetrics and Gynecology

## 2016-12-31 DIAGNOSIS — D250 Submucous leiomyoma of uterus: Secondary | ICD-10-CM

## 2017-01-01 ENCOUNTER — Other Ambulatory Visit: Payer: Self-pay | Admitting: *Deleted

## 2017-01-01 ENCOUNTER — Other Ambulatory Visit (HOSPITAL_COMMUNITY): Payer: Self-pay | Admitting: Interventional Radiology

## 2017-01-01 ENCOUNTER — Ambulatory Visit
Admission: RE | Admit: 2017-01-01 | Discharge: 2017-01-01 | Disposition: A | Discharge status: Home / Self Care | Payer: BLUE CROSS/BLUE SHIELD | Source: Ambulatory Visit | Attending: Obstetrics and Gynecology | Admitting: Obstetrics and Gynecology

## 2017-01-01 ENCOUNTER — Other Ambulatory Visit: Payer: Self-pay | Admitting: Radiology

## 2017-01-01 DIAGNOSIS — D259 Leiomyoma of uterus, unspecified: Secondary | ICD-10-CM | POA: Diagnosis not present

## 2017-01-01 DIAGNOSIS — D250 Submucous leiomyoma of uterus: Secondary | ICD-10-CM

## 2017-01-01 HISTORY — PX: IR RADIOLOGIST EVAL & MGMT: IMG5224

## 2017-01-01 MED ORDER — DIAZEPAM 10 MG PO TABS: 10.0000 mg | ORAL_TABLET | ORAL | 0 refills | Status: DC

## 2017-01-01 NOTE — Consult Note (Signed)
Chief Complaint: Patient was seen in consultation today for  Chief Complaint  Patient presents with  . Fibroids    Consult for [place]   at the request of [place]  Referring Physician(s): Governor Specking  History of Present Illness: Ashley Davis is a 51 y.o. female kindly referred by Dr. Kerin Perna for consultation regarding symptomatic uterine fibroids. The patient has had a diagnoses of uterine fibroids for at least 10 years. She's had many years of Mirage a and dysmenorrhea, exacerbating over this time frame. Her menstrual cycle is every 28 days lasting 5-7 days with 3 days of heavy bleeding including clots, requiring changing to a right pads every hour and a half. She denies any significant interperiod bleeding. She is on multivitamin with iron because of anemia. She has required no blood transfusions because of anemia. She does have some bulk symptoms  as well including urinary frequency, nocturia, urgency, constipation, abdominal pelvic pressure. She was reportedly told by spine surgeon that her enlarged uterus may be affecting causing regional nerve irritation. She's not been on any birth control pills or other medical fibroid therapy. She is G4 P2 SA 2 with no plans for future pregnancy. No previous fibroid surgeries. No history of gynecologic infection.    Past Medical History:  Diagnosis Date  . Asthma   . Depression   . Hypertension    Pt states her HTN is due to her migraines  . Migraine   . Stroke [facility])     Past Surgical History:  Procedure Laterality Date  . NO PAST SURGERIES      Allergies: Augmentin [amoxicillin-pot clavulanate] and Shellfish allergy  Medications: Prior to Admission medications   Medication Sig Start Date End Date Taking? Authorizing Provider  albuterol (PROVENTIL HFA;VENTOLIN HFA) 108 (90 BASE) MCG/ACT inhaler Inhale 2 puffs into the lungs every 6 (six) hours as needed for wheezing.   Yes Historical Provider, MD    aspirin-acetaminophen-caffeine (EXCEDRIN MIGRAINE) [phone] MG tablet Take 1-3 tablets by mouth every 6 (six) hours as needed for headache or migraine.   Yes Historical Provider, MD  losartan (COZAAR) 50 MG tablet Take 50 mg by mouth daily.   Yes Historical Provider, MD  benzonatate (TESSALON) 100 MG capsule Take 2 capsules (200 mg total) by mouth 2 (two) times daily as needed for cough. Patient not taking: Reported on 01/01/2017 09/19/16   Montine Circle, PA-C  diazepam (VALIUM) 10 MG tablet Take 1 tablet (10 mg total) by mouth as directed. Take 1 (10 mg tablet) 45 minutes prior to MR (scheduled for 01/16/2017 at 9 am).  May take an additional 10 mg tablet if needed. 01/16/17   Arne Cleveland, MD  diphenhydrAMINE (BENADRYL) 25 MG tablet Take 50 mg by mouth daily as needed for allergies.     Historical Provider, MD  doxycycline (VIBRAMYCIN) 100 MG capsule Take 1 capsule (100 mg total) by mouth 2 (two) times daily. Patient not taking: Reported on 01/01/2017 09/20/16   Nona Dell, PA-C  hydrochlorothiazide (MICROZIDE) 12.5 MG capsule Take 12.5 mg by mouth daily. 10/25/15   Historical Provider, MD  oxymetazoline (AFRIN NASAL SPRAY) 0.05 % nasal spray Place 1 spray into both nostrils 2 (two) times daily. Spray once into each nostril twice daily for up to the next 3 days. Do not use for more than 3 days to prevent rebound rhinorrhea. Patient not taking: Reported on 01/01/2017 09/20/16   Nona Dell, PA-C  sodium chloride (OCEAN) 0.65 % SOLN nasal spray Place 1 spray  into both nostrils as needed for congestion. Patient not taking: Reported on 01/01/2017 09/19/16   Montine Circle, PA-C     Family History  Problem Relation Age of Onset  . Diabetes Mother   . Stroke Mother   . Cancer Mother   . Headache Mother   . Diabetes Father   . Other Father     neuropathy  . Headache Sister   . Aneurysm Sister     3 brain surgeries    Social History   Social History  . Marital status:  Married    Spouse name: Darren  . Number of children: 2  . Years of education: College   Occupational History  .  [employer]    n/a   Social History Main Topics  . Smoking status: Former Smoker    Packs/day: 0.25    Years: 30.00    Types: Cigarettes  . Smokeless tobacco: Never Used  . Alcohol use Yes     Comment: occ  . Drug use: No  . Sexual activity: Not Asked   Other Topics Concern  . None   Social History Narrative   Patient lives at home with family.   Caffeine Use: 1 cup 2-3 times weekly  She works as a Librarian, academic at [employer].  ECOG Status: 1 - Symptomatic but completely ambulatory  Review of Systems: A 12 point ROS discussed and pertinent positives are indicated in the HPI above.  All other systems are negative.  Review of Systems  Vital Signs: BP (!) 145/76 (BP Location: Left Arm, Patient Position: Sitting, Cuff Size: Large)   Pulse 68   Temp 98.1 F (36.7 C) (Oral)   Resp 15   Ht 5' 10.5" (1.791 m)   Wt 286 lb (129.7 kg)   LMP 12/27/2016 (Approximate)   SpO2 98%   BMI 40.46 kg/m   Physical Exam  Constitutional: She is oriented to person, place, and time. She appears well-developed and well-nourished. No distress.  HENT:  Head: Normocephalic and atraumatic.  Eyes: Conjunctivae and EOM are normal. Right eye exhibits no discharge. Left eye exhibits no discharge. No scleral icterus.  Neck: No JVD present.  Pulmonary/Chest: Effort normal. No stridor. No respiratory distress.  Neurological: She is alert and oriented to person, place, and time.  Skin: Skin is warm and dry. She is not diaphoretic.  Psychiatric: She has a normal mood and affect. Her behavior is normal. Judgment and thought content normal.    Mallampati Score:     Imaging: No results found.  Labs:  CBC:  Recent Labs  09/20/16 2230  WBC 5.4  HGB 12.8  HCT 37.2  PLT 237    COAGS: No results for input(s): INR, APTT in the last 8760 hours.  BMP:  Recent Labs   09/20/16 2230  NA 137  K 3.2*  CL 101  CO2 27  GLUCOSE 131*  BUN 13  CALCIUM 8.6*  CREATININE 0.64  GFRNONAA >60  GFRAA >60    LIVER FUNCTION TESTS: No results for input(s): BILITOT, AST, ALT, ALKPHOS, PROT, ALBUMIN in the last 8760 hours.  TUMOR MARKERS: No results for input(s): AFPTM, CEA, CA199, CHROMGRNA in the last 8760 hours.  Assessment and Plan:  My impression is that this patient has a markedly enlarged uterus secondary to multiple uterine fibroids, likely accounting for her menorrhagia and other symptoms as above. We discussed the pathophysiology of uterine leiomyomata, natural history including involution post menopause, and treatment options including watchful waiting, hysterectomy, and uterine fibroid  embolization. Based on her workup thus far, I think she would be an appropriate candidate for uterine fibroid embolization.  We discussed the details of UFE, anticipated benefits, time course of symptom resolution, possible risks and complications, and need for continued gynecologic care. She had received good information before the consult.  She seemed to understand and did have her questions answered.  To complete her workup, I would require an MR pelvis with contrast to define the exact location of her uterine fibroids, specifically to exclude any pedunculated subserosal or submucosal fibroids on a narrow stalk, as well as to exclude any unexpected pelvic pathology. She is motivated to proceed, so we will set this up as an outpatient at her convenience. After this is reviewed, we can set up at her convenience UFE under moderate sedation at [facility] with overnight observation.   Thank you for this interesting consult.  I greatly enjoyed meeting Grasiela D Rennert and look forward to participating in their care.  A copy of this report was sent to the requesting provider on this date.  Electronically Signed: Arieanna Pressey III, DAYNE Galilee Pierron 01/01/2017, 11:02 AM   I  spent a total of  40 Minutes   in face to face in clinical consultation, greater than 50% of which was counseling/coordinating care forsymptomatic uterine fibroids.

## 2017-01-16 ENCOUNTER — Other Ambulatory Visit: Payer: Self-pay | Admitting: Interventional Radiology

## 2017-01-16 ENCOUNTER — Ambulatory Visit (HOSPITAL_COMMUNITY)
Admission: RE | Admit: 2017-01-16 | Discharge: 2017-01-16 | Disposition: A | Discharge status: Home / Self Care | Payer: BLUE CROSS/BLUE SHIELD | Source: Ambulatory Visit | Attending: Interventional Radiology | Admitting: Interventional Radiology

## 2017-01-16 DIAGNOSIS — D251 Intramural leiomyoma of uterus: Secondary | ICD-10-CM

## 2017-01-16 DIAGNOSIS — D250 Submucous leiomyoma of uterus: Secondary | ICD-10-CM | POA: Diagnosis not present

## 2017-01-16 DIAGNOSIS — N888 Other specified noninflammatory disorders of cervix uteri: Secondary | ICD-10-CM | POA: Insufficient documentation

## 2017-01-16 DIAGNOSIS — D259 Leiomyoma of uterus, unspecified: Secondary | ICD-10-CM | POA: Diagnosis not present

## 2017-01-16 LAB — CREATININE, SERUM: Creatinine, Ser: 0.59 mg/dL (ref 0.44–1.00)

## 2017-01-16 MED ORDER — GADOBENATE DIMEGLUMINE 529 MG/ML IV SOLN
20.0000 mL | Freq: Once | INTRAVENOUS | Status: AC | PRN
  Administered 2017-01-16: 20 mL via INTRAVENOUS

## 2017-01-20 ENCOUNTER — Other Ambulatory Visit: Payer: Self-pay | Admitting: Interventional Radiology

## 2017-02-13 ENCOUNTER — Encounter: Payer: Self-pay | Admitting: Obstetrics and Gynecology

## 2017-04-06 ENCOUNTER — Other Ambulatory Visit: Payer: Self-pay | Admitting: Student

## 2017-04-07 ENCOUNTER — Observation Stay (HOSPITAL_COMMUNITY)
Admission: RE | Admit: 2017-04-07 | Discharge: 2017-04-08 | Disposition: A | Discharge status: Home / Self Care | Payer: BLUE CROSS/BLUE SHIELD | Source: Ambulatory Visit | Attending: Interventional Radiology | Admitting: Interventional Radiology

## 2017-04-07 ENCOUNTER — Encounter (HOSPITAL_COMMUNITY): Payer: Self-pay

## 2017-04-07 ENCOUNTER — Ambulatory Visit (HOSPITAL_COMMUNITY)
Admission: RE | Admit: 2017-04-07 | Discharge: 2017-04-07 | Disposition: A | Discharge status: Home / Self Care | Payer: BLUE CROSS/BLUE SHIELD | Source: Ambulatory Visit | Attending: Interventional Radiology | Admitting: Interventional Radiology

## 2017-04-07 DIAGNOSIS — D649 Anemia, unspecified: Secondary | ICD-10-CM | POA: Diagnosis not present

## 2017-04-07 DIAGNOSIS — Z880 Allergy status to penicillin: Secondary | ICD-10-CM | POA: Diagnosis not present

## 2017-04-07 DIAGNOSIS — I10 Essential (primary) hypertension: Secondary | ICD-10-CM | POA: Diagnosis not present

## 2017-04-07 DIAGNOSIS — N920 Excessive and frequent menstruation with regular cycle: Secondary | ICD-10-CM | POA: Diagnosis not present

## 2017-04-07 DIAGNOSIS — Z79899 Other long term (current) drug therapy: Secondary | ICD-10-CM | POA: Diagnosis not present

## 2017-04-07 DIAGNOSIS — D259 Leiomyoma of uterus, unspecified: Secondary | ICD-10-CM | POA: Diagnosis not present

## 2017-04-07 DIAGNOSIS — Z87891 Personal history of nicotine dependence: Secondary | ICD-10-CM | POA: Insufficient documentation

## 2017-04-07 DIAGNOSIS — D251 Intramural leiomyoma of uterus: Secondary | ICD-10-CM

## 2017-04-07 DIAGNOSIS — Z7982 Long term (current) use of aspirin: Secondary | ICD-10-CM | POA: Diagnosis not present

## 2017-04-07 DIAGNOSIS — Z8673 Personal history of transient ischemic attack (TIA), and cerebral infarction without residual deficits: Secondary | ICD-10-CM | POA: Insufficient documentation

## 2017-04-07 DIAGNOSIS — D219 Benign neoplasm of connective and other soft tissue, unspecified: Secondary | ICD-10-CM | POA: Diagnosis present

## 2017-04-07 DIAGNOSIS — Z91013 Allergy to seafood: Secondary | ICD-10-CM | POA: Diagnosis not present

## 2017-04-07 HISTORY — PX: IR ANGIOGRAM PELVIS SELECTIVE OR SUPRASELECTIVE: IMG661

## 2017-04-07 HISTORY — PX: IR US GUIDE VASC ACCESS RIGHT: IMG2390

## 2017-04-07 HISTORY — PX: IR ANGIOGRAM VISCERAL SELECTIVE: IMG657

## 2017-04-07 HISTORY — PX: IR ANGIOGRAM SELECTIVE EACH ADDITIONAL VESSEL: IMG667

## 2017-04-07 HISTORY — PX: IR EMBO TUMOR ORGAN ISCHEMIA INFARCT INC GUIDE ROADMAPPING: IMG5449

## 2017-04-07 HISTORY — DX: Personal history of other diseases of the respiratory system: Z87.09

## 2017-04-07 LAB — CBC
HCT: 33.7 % — ABNORMAL LOW (ref 36.0–46.0)
Hemoglobin: 11.7 g/dL — ABNORMAL LOW (ref 12.0–15.0)
MCH: 29.7 pg (ref 26.0–34.0)
MCHC: 34.7 g/dL (ref 30.0–36.0)
MCV: 85.5 fL (ref 78.0–100.0)
PLATELETS: 245 10*3/uL (ref 150–400)
RBC: 3.94 MIL/uL (ref 3.87–5.11)
RDW: 13.5 % (ref 11.5–15.5)
WBC: 4.8 10*3/uL (ref 4.0–10.5)

## 2017-04-07 LAB — BASIC METABOLIC PANEL
Anion gap: 8 (ref 5–15)
BUN: 11 mg/dL (ref 6–20)
CHLORIDE: 108 mmol/L (ref 101–111)
CO2: 26 mmol/L (ref 22–32)
CREATININE: 0.53 mg/dL (ref 0.44–1.00)
Calcium: 8.7 mg/dL — ABNORMAL LOW (ref 8.9–10.3)
Glucose, Bld: 101 mg/dL — ABNORMAL HIGH (ref 65–99)
POTASSIUM: 3.3 mmol/L — AB (ref 3.5–5.1)
SODIUM: 142 mmol/L (ref 135–145)

## 2017-04-07 LAB — PROTIME-INR
INR: 1.02
PROTHROMBIN TIME: 13.5 s (ref 11.4–15.2)

## 2017-04-07 LAB — APTT: APTT: 28 s (ref 24–36)

## 2017-04-07 LAB — HCG, SERUM, QUALITATIVE: PREG SERUM: NEGATIVE

## 2017-04-07 MED ORDER — NALOXONE HCL 0.4 MG/ML IJ SOLN: 0.4000 mg | INTRAMUSCULAR | Status: DC | PRN

## 2017-04-07 MED ORDER — ONDANSETRON HCL 4 MG/2ML IJ SOLN: 4.0000 mg | Freq: Four times a day (QID) | INTRAMUSCULAR | Status: DC | PRN

## 2017-04-07 MED ORDER — VANCOMYCIN HCL IN DEXTROSE 1-5 GM/200ML-% IV SOLN
INTRAVENOUS | Status: AC
  Filled 2017-04-07: qty 200

## 2017-04-07 MED ORDER — VANCOMYCIN HCL IN DEXTROSE 1-5 GM/200ML-% IV SOLN
1000.0000 mg | Freq: Once | INTRAVENOUS | Status: AC
  Administered 2017-04-07: 1000 mg via INTRAVENOUS

## 2017-04-07 MED ORDER — IOPAMIDOL (ISOVUE-300) INJECTION 61%
100.0000 mL | Freq: Once | INTRAVENOUS | Status: AC | PRN
  Administered 2017-04-07: 28 mL via INTRAVENOUS

## 2017-04-07 MED ORDER — IOPAMIDOL (ISOVUE-300) INJECTION 61%
INTRAVENOUS | Status: AC
  Administered 2017-04-07: 28 mL via INTRAVENOUS
  Filled 2017-04-07: qty 100

## 2017-04-07 MED ORDER — IBUPROFEN 800 MG PO TABS
800.0000 mg | ORAL_TABLET | Freq: Four times a day (QID) | ORAL | Status: DC
  Administered 2017-04-07 – 2017-04-08 (×2): 800 mg via ORAL
  Filled 2017-04-07 (×2): qty 1

## 2017-04-07 MED ORDER — MIDAZOLAM HCL 2 MG/2ML IJ SOLN
INTRAMUSCULAR | Status: AC | PRN
  Administered 2017-04-07 (×4): 1 mg via INTRAVENOUS

## 2017-04-07 MED ORDER — ONDANSETRON HCL 4 MG/2ML IJ SOLN
INTRAMUSCULAR | Status: AC
  Filled 2017-04-07: qty 2

## 2017-04-07 MED ORDER — DIPHENHYDRAMINE HCL 50 MG/ML IJ SOLN: 12.5000 mg | Freq: Four times a day (QID) | INTRAMUSCULAR | Status: DC | PRN

## 2017-04-07 MED ORDER — IOPAMIDOL (ISOVUE-300) INJECTION 61%
100.0000 mL | Freq: Once | INTRAVENOUS | Status: DC | PRN
Start: 2017-04-07 — End: 2017-04-08

## 2017-04-07 MED ORDER — HYDROCODONE-ACETAMINOPHEN 5-325 MG PO TABS: 1.0000 | ORAL_TABLET | ORAL | Status: DC | PRN

## 2017-04-07 MED ORDER — PROMETHAZINE HCL 25 MG RE SUPP: 25.0000 mg | Freq: Three times a day (TID) | RECTAL | Status: DC | PRN

## 2017-04-07 MED ORDER — SODIUM CHLORIDE 0.9% FLUSH: 9.0000 mL | INTRAVENOUS | Status: DC | PRN

## 2017-04-07 MED ORDER — DOCUSATE SODIUM 100 MG PO CAPS
100.0000 mg | ORAL_CAPSULE | Freq: Two times a day (BID) | ORAL | Status: DC
  Administered 2017-04-07 – 2017-04-08 (×2): 100 mg via ORAL
  Filled 2017-04-07 (×2): qty 1

## 2017-04-07 MED ORDER — KETOROLAC TROMETHAMINE 30 MG/ML IJ SOLN
30.0000 mg | Freq: Once | INTRAMUSCULAR | Status: AC
  Administered 2017-04-07: 30 mg via INTRAVENOUS

## 2017-04-07 MED ORDER — FENTANYL CITRATE (PF) 100 MCG/2ML IJ SOLN
INTRAMUSCULAR | Status: AC
  Filled 2017-04-07: qty 4

## 2017-04-07 MED ORDER — SODIUM CHLORIDE 0.9% FLUSH: 3.0000 mL | Freq: Two times a day (BID) | INTRAVENOUS | Status: DC

## 2017-04-07 MED ORDER — HYDROMORPHONE 1 MG/ML IV SOLN
INTRAVENOUS | Status: DC
  Administered 2017-04-07: 1.4 mg via INTRAVENOUS
  Administered 2017-04-07: 16:00:00 via INTRAVENOUS
  Administered 2017-04-08: 0.6 mg via INTRAVENOUS
  Administered 2017-04-08 (×2): 0.3 mg via INTRAVENOUS
  Filled 2017-04-07: qty 25

## 2017-04-07 MED ORDER — LIDOCAINE HCL 1 % IJ SOLN
INTRAMUSCULAR | Status: AC | PRN
  Administered 2017-04-07: 5 mL

## 2017-04-07 MED ORDER — MIDAZOLAM HCL 2 MG/2ML IJ SOLN
INTRAMUSCULAR | Status: AC
  Filled 2017-04-07: qty 6

## 2017-04-07 MED ORDER — IOPAMIDOL (ISOVUE-300) INJECTION 61%
INTRAVENOUS | Status: AC
  Administered 2017-04-07: 20 mL
  Filled 2017-04-07: qty 100

## 2017-04-07 MED ORDER — PROMETHAZINE HCL 25 MG/ML IJ SOLN
25.0000 mg | Freq: Four times a day (QID) | INTRAMUSCULAR | Status: DC | PRN
  Administered 2017-04-07: 25 mg via INTRAVENOUS
  Filled 2017-04-07: qty 1

## 2017-04-07 MED ORDER — SODIUM CHLORIDE 0.9 % IV SOLN
INTRAVENOUS | Status: DC
  Administered 2017-04-07: 12:00:00 via INTRAVENOUS

## 2017-04-07 MED ORDER — PROMETHAZINE HCL 25 MG PO TABS: 25.0000 mg | ORAL_TABLET | Freq: Three times a day (TID) | ORAL | Status: DC | PRN

## 2017-04-07 MED ORDER — ONDANSETRON HCL 4 MG/2ML IJ SOLN
4.0000 mg | Freq: Four times a day (QID) | INTRAMUSCULAR | Status: DC | PRN
  Administered 2017-04-07: 4 mg via INTRAVENOUS

## 2017-04-07 MED ORDER — DIPHENHYDRAMINE HCL 12.5 MG/5ML PO ELIX
12.5000 mg | ORAL_SOLUTION | Freq: Four times a day (QID) | ORAL | Status: DC | PRN
  Filled 2017-04-07: qty 5

## 2017-04-07 MED ORDER — FENTANYL CITRATE (PF) 100 MCG/2ML IJ SOLN
INTRAMUSCULAR | Status: AC | PRN
  Administered 2017-04-07 (×4): 50 ug via INTRAVENOUS

## 2017-04-07 MED ORDER — IOPAMIDOL (ISOVUE-300) INJECTION 61%
100.0000 mL | Freq: Once | INTRAVENOUS | Status: AC | PRN
  Administered 2017-04-07: 50 mL via INTRAVENOUS

## 2017-04-07 MED ORDER — IOPAMIDOL (ISOVUE-300) INJECTION 61%
INTRAVENOUS | Status: AC
  Filled 2017-04-07: qty 100

## 2017-04-07 MED ORDER — SODIUM CHLORIDE 0.9% FLUSH: 3.0000 mL | INTRAVENOUS | Status: DC | PRN

## 2017-04-07 MED ORDER — SODIUM CHLORIDE 0.9 % IV SOLN: 250.0000 mL | INTRAVENOUS | Status: DC | PRN

## 2017-04-07 MED ORDER — LIDOCAINE HCL 1 % IJ SOLN
INTRAMUSCULAR | Status: AC
  Filled 2017-04-07: qty 20

## 2017-04-07 MED ORDER — DIPHENHYDRAMINE HCL 12.5 MG/5ML PO ELIX
12.5000 mg | ORAL_SOLUTION | Freq: Four times a day (QID) | ORAL | Status: DC | PRN
Start: 2017-04-07 — End: 2017-04-07
  Filled 2017-04-07: qty 5

## 2017-04-07 MED ORDER — KETOROLAC TROMETHAMINE 30 MG/ML IJ SOLN
INTRAMUSCULAR | Status: AC
  Administered 2017-04-07: 30 mg via INTRAVENOUS
  Filled 2017-04-07: qty 1

## 2017-04-07 NOTE — H&P (Signed)
Chief Complaint: uterine fibroids  Referring Physician: Dr. Governor Specking  Supervising Physician: Arne Cleveland  Patient Status: [facility] - Out-pt  HPI: Ashley Davis is a 51 y.o. female who is known to Dr. Vernard Gambles as he saw her in May for her uterine fibroids.  "The patient has had a diagnoses of uterine fibroids for at least 10 years. She's had many years of Mirage a and dysmenorrhea, exacerbating over this time frame. Her menstrual cycle is every 28 days lasting 5-7 days with 3 days of heavy bleeding including clots, requiring changing to a right pads every hour and a half. She denies any significant interperiod bleeding. She is on multivitamin with iron because of anemia. She has required no blood transfusions because of anemia. She does have some bulk symptoms  as well including urinary frequency, nocturia, urgency, constipation, abdominal pelvic pressure. She was reportedly told by spine surgeon that her enlarged uterus may be affecting causing regional nerve irritation. She's not been on any birth control pills or other medical fibroid therapy. She is G4 P2 SA 2 with no plans for future pregnancy. No previous fibroid surgeries. No history of gynecologic infection."  She presents today for a uterine fibroid embolization.  Past Medical History:  Past Medical History:  Diagnosis Date  . Asthma   . Depression   . History of bronchitis   . Hypertension    Pt states her HTN is due to her migraines  . Migraine   . Stroke [facility])     Past Surgical History:  Past Surgical History:  Procedure Laterality Date  . IR RADIOLOGIST EVAL & MGMT  01/01/2017  . NO PAST SURGERIES      Family History:  Family History  Problem Relation Age of Onset  . Diabetes Mother   . Stroke Mother   . Cancer Mother   . Headache Mother   . Diabetes Father   . Other Father        neuropathy  . Headache Sister   . Aneurysm Sister        3 brain surgeries    Social History:  reports that she has  quit smoking. Her smoking use included Cigarettes. She has a 7.50 pack-year smoking history. She has never used smokeless tobacco. She reports that she drinks alcohol. She reports that she does not use drugs.  Allergies:  Allergies  Allergen Reactions  . Augmentin [Amoxicillin-Pot Clavulanate] Other (See Comments)    Heart palpitations and chest pressure  . Shellfish Allergy Hives    Medications: Medications reviewed in epic  Please HPI for pertinent positives, otherwise complete 10 system ROS negative.  Mallampati Score: MD Evaluation Airway: WNL Heart: WNL Abdomen: WNL Chest/ Lungs: WNL ASA  Classification: 2 Mallampati/Airway Score: One  Physical Exam: BP (!) 152/97 (BP Location: Right Arm)   Pulse 79   Temp 98.4 F (36.9 C) (Oral)   Resp 16   LMP 03/31/2017 (Exact Date)   SpO2 99%  There is no height or weight on file to calculate BMI. General: pleasant, obese, black female who is laying in bed in NAD HEENT: head is normocephalic, atraumatic.  Sclera are noninjected.  PERRL.  Ears and nose without any masses or lesions.  Mouth is pink and moist Heart: regular, rate, and rhythm.  Normal s1,s2. No obvious murmurs, gallops, or rubs noted.  Palpable radial and pedal pulses bilaterally Lungs: CTAB, no wheezes, rhonchi, or rales noted.  Respiratory effort nonlabored Abd: soft, NT, ND, +BS, obese, no  masses, hernias, or organomegaly Psych: A&Ox3 with an appropriate affect.   Labs: Results for orders placed or performed during the hospital encounter of 04/07/17 (from the past 48 hour(s))  Basic metabolic panel     Status: Abnormal   Collection Time: 04/07/17 11:33 AM  Result Value Ref Range   Sodium 142 135 - 145 mmol/L   Potassium 3.3 (L) 3.5 - 5.1 mmol/L   Chloride 108 101 - 111 mmol/L   CO2 26 22 - 32 mmol/L   Glucose, Bld 101 (H) 65 - 99 mg/dL   BUN 11 6 - 20 mg/dL   Creatinine, Ser 0.53 0.44 - 1.00 mg/dL   Calcium 8.7 (L) 8.9 - 10.3 mg/dL   GFR calc non Af Amer  >60 >60 mL/min   GFR calc Af Amer >60 >60 mL/min    Comment: (NOTE) The eGFR has been calculated using the CKD EPI equation. This calculation has not been validated in all clinical situations. eGFR's persistently <60 mL/min signify possible Chronic Kidney Disease.    Anion gap 8 5 - 15  hCG, serum, qualitative     Status: None   Collection Time: 04/07/17 11:33 AM  Result Value Ref Range   Preg, Serum NEGATIVE NEGATIVE    Comment:        THE SENSITIVITY OF THIS METHODOLOGY IS >10 mIU/mL.   APTT upon arrival     Status: None   Collection Time: 04/07/17 11:33 AM  Result Value Ref Range   aPTT 28 24 - 36 seconds  CBC upon arrival     Status: Abnormal   Collection Time: 04/07/17 11:33 AM  Result Value Ref Range   WBC 4.8 4.0 - 10.5 K/uL   RBC 3.94 3.87 - 5.11 MIL/uL   Hemoglobin 11.7 (L) 12.0 - 15.0 g/dL   HCT 33.7 (L) 36.0 - 46.0 %   MCV 85.5 78.0 - 100.0 fL   MCH 29.7 26.0 - 34.0 pg   MCHC 34.7 30.0 - 36.0 g/dL   RDW 13.5 11.5 - 15.5 %   Platelets 245 150 - 400 K/uL  Protime-INR upon arrival     Status: None   Collection Time: 04/07/17 11:33 AM  Result Value Ref Range   Prothrombin Time 13.5 11.4 - 15.2 seconds   INR 1.02     Imaging: No results found.  Assessment/Plan 1. Uterine fibroids with menorrhagia  We will plan to proceed today with a uterine fibroid embolization.  Her labs and vitals have been reviewed.  She will be admitted overnight for observation.  If she is doing well tomorrow, we will plan for DC home with the typical, anti-emetic, pain medication, and ibuprofen.   Risks and benefits discussed with the patient including, but not limited to bleeding, infection, vascular injury or contrast induced renal failure. All of the patient's questions were answered, patient is agreeable to proceed. Consent signed and in chart.  Thank you for this interesting consult.  I greatly enjoyed meeting Ashley Davis and look forward to participating in their care.  A  copy of this report was sent to the requesting provider on this date.  Electronically Signed: Henreitta Cea 04/07/2017, 1:12 PM   I spent a total of    25 Minutes in face to face in clinical consultation, greater than 50% of which was counseling/coordinating care for uterine fibroids

## 2017-04-07 NOTE — Procedures (Signed)
  Procedure:   Left Uterine Artery embolization  Right not identified Preprocedure diagnosis:  Fibroids Postprocedure diagnosis:  same EBL:     minimal Complications:   none immediate  See full dictation in [facility].  Dillard Cannon MD Main # [phone] Pager  [phone]

## 2017-04-08 ENCOUNTER — Other Ambulatory Visit: Payer: Self-pay | Admitting: Radiology

## 2017-04-08 DIAGNOSIS — I10 Essential (primary) hypertension: Secondary | ICD-10-CM | POA: Diagnosis not present

## 2017-04-08 DIAGNOSIS — Z87891 Personal history of nicotine dependence: Secondary | ICD-10-CM | POA: Diagnosis not present

## 2017-04-08 DIAGNOSIS — D649 Anemia, unspecified: Secondary | ICD-10-CM | POA: Diagnosis not present

## 2017-04-08 DIAGNOSIS — Z7982 Long term (current) use of aspirin: Secondary | ICD-10-CM | POA: Diagnosis not present

## 2017-04-08 DIAGNOSIS — D259 Leiomyoma of uterus, unspecified: Secondary | ICD-10-CM | POA: Diagnosis not present

## 2017-04-08 DIAGNOSIS — N920 Excessive and frequent menstruation with regular cycle: Secondary | ICD-10-CM | POA: Diagnosis not present

## 2017-04-08 DIAGNOSIS — D219 Benign neoplasm of connective and other soft tissue, unspecified: Secondary | ICD-10-CM | POA: Diagnosis present

## 2017-04-08 DIAGNOSIS — Z880 Allergy status to penicillin: Secondary | ICD-10-CM | POA: Diagnosis not present

## 2017-04-08 DIAGNOSIS — D251 Intramural leiomyoma of uterus: Secondary | ICD-10-CM

## 2017-04-08 DIAGNOSIS — Z8673 Personal history of transient ischemic attack (TIA), and cerebral infarction without residual deficits: Secondary | ICD-10-CM | POA: Diagnosis not present

## 2017-04-08 DIAGNOSIS — Z79899 Other long term (current) drug therapy: Secondary | ICD-10-CM | POA: Diagnosis not present

## 2017-04-08 DIAGNOSIS — Z91013 Allergy to seafood: Secondary | ICD-10-CM | POA: Diagnosis not present

## 2017-04-08 MED ORDER — DOCUSATE SODIUM 100 MG PO CAPS: 100.0000 mg | ORAL_CAPSULE | Freq: Two times a day (BID) | ORAL | 0 refills | Status: DC

## 2017-04-08 MED ORDER — PROMETHAZINE HCL 25 MG RE SUPP: 25.0000 mg | Freq: Three times a day (TID) | RECTAL | Status: DC | PRN

## 2017-04-08 MED ORDER — HYDROCODONE-ACETAMINOPHEN 5-325 MG PO TABS: 1.0000 | ORAL_TABLET | ORAL | 0 refills | Status: DC | PRN

## 2017-04-08 MED ORDER — PROMETHAZINE HCL 25 MG PO TABS
25.0000 mg | ORAL_TABLET | Freq: Three times a day (TID) | ORAL | 0 refills | Status: DC | PRN
Start: 2017-04-08 — End: 2017-04-30

## 2017-04-08 MED ORDER — PROMETHAZINE HCL 25 MG PO TABS: 25.0000 mg | ORAL_TABLET | Freq: Three times a day (TID) | ORAL | Status: DC | PRN

## 2017-04-08 MED ORDER — IBUPROFEN 800 MG PO TABS: 800.0000 mg | ORAL_TABLET | Freq: Four times a day (QID) | ORAL | 0 refills | Status: DC

## 2017-04-08 MED ORDER — PROMETHAZINE HCL 25 MG/ML IJ SOLN: 25.0000 mg | Freq: Four times a day (QID) | INTRAMUSCULAR | Status: DC | PRN

## 2017-04-08 NOTE — Progress Notes (Signed)
IV dilaudid 22mg  wasted in sink with [place] RN witnessing.

## 2017-04-08 NOTE — Progress Notes (Signed)
Pt s/p [place] Doing well this am. Some pain, mostly stiff back Nauseous last pm so hasn't eaten, but ordering breakfast now. Has been OOB and voided on own, Foley removed.  Will DC PCA See how she tolerates diet and OOB DC later this morning.   Ascencion Dike PA-C Interventional Radiology 04/08/2017 9:01 AM

## 2017-04-08 NOTE — Progress Notes (Signed)
Pts IV removed with a clean and dry dressing intact. Pt rates pain as a 4 on the 0-10 pain scale and understands she will have pain and has been given a prescription for home. Pt does not appear to have any s/s of distress upon discharge. Pt taken to the front entrance by nursing staff with husband present.

## 2017-04-08 NOTE — Discharge Summary (Signed)
Patient ID: Ashley Davis MRN: [id number] DOB/AGE: 1965-12-29 51 y.o.  Admit date: 04/07/2017 Discharge date: 04/08/2017  Supervising Physician: Corrie Mckusick  Patient Status: [facility] - In-pt  Admission Diagnoses: Symptomatic uterine fibroids  Discharge Diagnoses:  Principal Problem:   Fibroids   Discharged Condition: good  Hospital Course: Pt admitted on 8/7 after successful (L)uterine artery embolization, the right was not identified. No immediate procedural complications. She is feeling well POD#1. Has voided after foley catheter removed. Tolerating diet. Pain well controlled. She is stable for discharge. All medications, instructions, and follow up plans reviewed.   Discharge Exam: Blood pressure 132/73, pulse 68, temperature 98.9 F (37.2 C), temperature source Oral, resp. rate 13, last menstrual period 03/31/2017, SpO2 98 %. General: NAD Lungs: CTA without w/r/r Heart: Regular Abdomen: soft, NT, ND Ext: (R)groin site soft, NT, no hematoma. Legs warm, 2+ distal pulses.  Disposition: 01-Home or Self Care  Discharge Instructions    Call MD for:  difficulty breathing, headache or visual disturbances    Complete by:  As directed    Call MD for:  persistant nausea and vomiting    Complete by:  As directed    Call MD for:  redness, tenderness, or signs of infection (pain, swelling, redness, odor or green/yellow discharge around incision site)    Complete by:  As directed    Call MD for:  severe uncontrolled pain    Complete by:  As directed    Call MD for:  temperature >100.4    Complete by:  As directed    Diet - low sodium heart healthy    Complete by:  As directed    Increase activity slowly    Complete by:  As directed    May shower / Bathe    Complete by:  As directed    May walk up steps    Complete by:  As directed    Remove dressing in 24 hours    Complete by:  As directed      Allergies as of 04/08/2017      Reactions   Augmentin [amoxicillin-pot  Clavulanate] Other (See Comments)   Has patient had a PCN reaction causing immediate rash, facial/tongue/throat swelling, SOB or lightheadedness with hypotension: yes, Heart palpitations and chest pressure Has patient had a PCN reaction causing severe rash involving mucus membranes or skin necrosis: no Has patient had a PCN reaction that required hospitalization: no Has patient had a PCN reaction occurring within the last 10 years: yes If all of the above answers are "NO", then may proceed with Cephalosporin use.   Shellfish Allergy Hives      Medication List    TAKE these medications   aspirin-acetaminophen-caffeine 250-250-65 MG tablet Commonly known as:  EXCEDRIN MIGRAINE Take 1-3 tablets by mouth every 6 (six) hours as needed for headache or migraine.   benzonatate 100 MG capsule Commonly known as:  TESSALON Take 2 capsules (200 mg total) by mouth 2 (two) times daily as needed for cough.   diazepam 10 MG tablet Commonly known as:  VALIUM Take 1 tablet (10 mg total) by mouth as directed. Take 1 (10 mg tablet) 45 minutes prior to MR (scheduled for 01/16/2017 at 9 am).  May take an additional 10 mg tablet if needed.   diphenhydrAMINE 25 MG tablet Commonly known as:  BENADRYL Take 50 mg by mouth daily as needed for allergies.   docusate sodium 100 MG capsule Commonly known as:  COLACE Take 1 capsule (100  mg total) by mouth 2 (two) times daily.   doxycycline 100 MG capsule Commonly known as:  VIBRAMYCIN Take 1 capsule (100 mg total) by mouth 2 (two) times daily.   hydrochlorothiazide 12.5 MG capsule Commonly known as:  MICROZIDE Take 12.5 mg by mouth daily.   HYDROcodone-acetaminophen 5-325 MG tablet Commonly known as:  NORCO/VICODIN Take 1 tablet by mouth every 4 (four) hours as needed for moderate pain.   ibuprofen 800 MG tablet Commonly known as:  ADVIL,MOTRIN Take 1 tablet (800 mg total) by mouth 4 (four) times daily.   losartan 50 MG tablet Commonly known as:   COZAAR Take 50 mg by mouth daily.   oxymetazoline 0.05 % nasal spray Commonly known as:  AFRIN NASAL SPRAY Place 1 spray into both nostrils 2 (two) times daily. Spray once into each nostril twice daily for up to the next 3 days. Do not use for more than 3 days to prevent rebound rhinorrhea.   promethazine 25 MG tablet Commonly known as:  PHENERGAN Take 1 tablet (25 mg total) by mouth every 8 (eight) hours as needed for nausea or refractory nausea / vomiting.   sodium chloride 0.65 % Soln nasal spray Commonly known as:  OCEAN Place 1 spray into both nostrils as needed for congestion.      Follow-up Information    Arne Cleveland, MD. Go in 2 week(s).   Specialty:  Interventional Radiology Why:  Clinic will call you to schedule Contact information: [address] [place] [address] [phone]            Electronically Signed: Ascencion Dike, PA-C 04/08/2017, 10:44 AM   I have spent Less Than 30 Minutes discharging Ashley Davis.

## 2017-04-08 NOTE — Care Management Note (Signed)
Case Management Note  Patient Details  Name: Ashley Davis MRN: [id number] Date of Birth: 1966/06/24  Subjective/Objective:    Uterine fibroid embolization                Action/Plan: Date:  April 08, 2017 Chart reviewed for concurrent status and case management needs. Will continue to follow patient progress. Discharge Planning: following for needs Expected discharge date: [id number] Velva Harman, BSN, [place], [place]  Expected Discharge Date:                  Expected Discharge Plan:  Home/Self Care  In-House Referral:     Discharge planning Services  CM Consult  Post Acute Care Choice:    Choice offered to:     DME Arranged:    DME Agency:     HH Arranged:    HH Agency:     Status of Service:  In process, will continue to follow  If discussed at Long Length of Stay Meetings, dates discussed:    Additional Comments:  Leeroy Cha, RN 04/08/2017, 8:42 AM

## 2017-04-08 NOTE — Discharge Instructions (Signed)
Uterine Artery Embolization for Fibroids, Care After °Refer to this sheet in the next few weeks. These instructions provide you with information on caring for yourself after your procedure. Your health care provider may also give you more specific instructions. Your treatment has been planned according to current medical practices, but problems sometimes occur. Call your health care provider if you have any problems or questions after your procedure. °What can I expect after the procedure? °After your procedure, it is typical to have cramping in the pelvis. You will be given pain medicine to control it. °Follow these instructions at home: °· Only take over-the-counter or prescription medicines for pain, discomfort, or fever as directed by your health care provider. °· Do not take aspirin. It can cause bleeding. °· Follow your health care provider’s advice regarding medicines given to you, diet, activity, and when to begin sexual activity. °· See your health care provider for follow-up care as directed. °Contact a health care provider if: °· You have a fever. °· You have redness, swelling, and pain around your incision site. °· You have pus draining from your incision. °· You have a rash. °Get help right away if: °· You have bleeding from your incision site. °· You have difficulty breathing. °· You have chest pain. °· You have severe abdominal pain. °· You have leg pain. °· You become dizzy and faint. °This information is not intended to replace advice given to you by your health care provider. Make sure you discuss any questions you have with your health care provider. °Document Released: 06/08/2013 Document Revised: 01/24/2016 Document Reviewed: 03/03/2013 °Elsevier Interactive Patient Education © 2018 [employer] ° °

## 2017-04-30 ENCOUNTER — Ambulatory Visit
Admission: RE | Admit: 2017-04-30 | Discharge: 2017-04-30 | Disposition: A | Discharge status: Home / Self Care | Payer: BLUE CROSS/BLUE SHIELD | Source: Ambulatory Visit | Attending: Radiology | Admitting: Radiology

## 2017-04-30 ENCOUNTER — Encounter: Payer: Self-pay | Admitting: Radiology

## 2017-04-30 DIAGNOSIS — D251 Intramural leiomyoma of uterus: Secondary | ICD-10-CM

## 2017-04-30 DIAGNOSIS — Z9889 Other specified postprocedural states: Secondary | ICD-10-CM | POA: Diagnosis not present

## 2017-04-30 DIAGNOSIS — D259 Leiomyoma of uterus, unspecified: Secondary | ICD-10-CM | POA: Diagnosis not present

## 2017-04-30 HISTORY — PX: IR RADIOLOGIST EVAL & MGMT: IMG5224

## 2017-04-30 NOTE — Progress Notes (Signed)
Patient ID: Ashley Davis, female   DOB: Jan 09, 1966, 51 y.o.   MRN: [id number]       Chief Complaint: Patient was seen in consultation today for follow-up uterine fibroid embolization at the request of [place]  Referring Physician(s): Bruning,Kevin  History of Present Illness: Ashley Davis is a 51 y.o. female who presents for scheduled for week follow-up appointment post uterine fibroid embolization.  She had  had a diagnoses of uterine fibroids for at least 10 years. She's had many years of menorrhagia and dysmenorrhea, exacerbating  this time frame. Her menstrual cycle had occurred every 28 days lasting 5-7 days with 3 days of heavy bleeding including clots, requiring changing to a right pads every hour and a half. She was having no  significant interperiod bleeding. She is on multivitamin with iron because of anemia. She had required no blood transfusions because of anemia. She was having some bulk symptoms  as well including urinary frequency, nocturia, urgency, constipation, abdominal pelvic pressure. She was reportedly told by spine surgeon that her enlarged uterus may be affecting causing regional nerve irritation. She's not been on any birth control pills or other medical fibroid therapy. She is G4 P2 SA 2 with no plans for future pregnancy. No previous fibroid surgeries. No history of gynecologic infection.   Uterine fibroid embolization 04/07/2017 was technically successful and the patient was discharged home the following day as planned.  She feels like her prescribed at-home medications were adequate to deal with her resolving post embolization syndrome. Her bleeding has significantly tapered off. She's having no new abdominal or pelvic pain. She feels back to full energy and baseline.  Past Medical History:  Diagnosis Date  . Asthma   . Depression   . History of bronchitis   . Hypertension    Pt states her HTN is due to her migraines  . Migraine   . Stroke [facility])       Past Surgical History:  Procedure Laterality Date  . IR ANGIOGRAM PELVIS SELECTIVE OR SUPRASELECTIVE  04/07/2017  . IR ANGIOGRAM PELVIS SELECTIVE OR SUPRASELECTIVE  04/07/2017  . IR ANGIOGRAM SELECTIVE EACH ADDITIONAL VESSEL  04/07/2017  . IR ANGIOGRAM SELECTIVE EACH ADDITIONAL VESSEL  04/07/2017  . IR ANGIOGRAM VISCERAL SELECTIVE  04/07/2017  . IR EMBO TUMOR ORGAN ISCHEMIA INFARCT INC GUIDE ROADMAPPING  04/07/2017  . IR RADIOLOGIST EVAL & MGMT  01/01/2017  . IR US GUIDE VASC ACCESS RIGHT  04/07/2017  . NO PAST SURGERIES      Allergies: Augmentin [amoxicillin-pot clavulanate] and Shellfish allergy  Medications: Prior to Admission medications   Medication Sig Start Date End Date Taking? Authorizing Provider  aspirin-acetaminophen-caffeine (EXCEDRIN MIGRAINE) [phone] MG tablet Take 1-3 tablets by mouth every 6 (six) hours as needed for headache or migraine.   Yes [provider]  diphenhydrAMINE (BENADRYL) 25 MG tablet Take 50 mg by mouth daily as needed for allergies.    Yes [provider]  ibuprofen (ADVIL,MOTRIN) 800 MG tablet Take 1 tablet (800 mg total) by mouth 4 (four) times daily. 04/08/17  Yes Bruning, Lennette Bihari, PA-C  losartan (COZAAR) 50 MG tablet Take 50 mg by mouth daily.   Yes [provider]     Family History  Problem Relation Age of Onset  . Diabetes Mother   . Stroke Mother   . Cancer Mother   . Headache Mother   . Diabetes Father   . Other Father        neuropathy  . Headache Sister   .  Aneurysm Sister        3 brain surgeries    Social History   Social History  . Marital status: Married    Spouse name: Darren  . Number of children: 2  . Years of education: College   Occupational History  .  [employer]    n/a   Social History Main Topics  . Smoking status: Former Smoker    Packs/day: 0.25    Years: 30.00    Types: Cigarettes  . Smokeless tobacco: Never Used  . Alcohol use Yes     Comment: rarely  . Drug use: No  . Sexual  activity: Not on file   Other Topics Concern  . Not on file   Social History Narrative   Patient lives at home with family.   Caffeine Use: 1 cup 2-3 times weekly    ECOG Status: 2, Symptomatic and completely ambulatory  Review of Systems: A 12 point ROS discussed and pertinent positives are indicated in the HPI above.  All other systems are negative.  Review of Systems  Vital Signs: BP (!) 154/87 (BP Location: Right Arm, Patient Position: Sitting, Cuff Size: Normal)   Pulse 74   Temp 98.5 F (36.9 C)   Resp 16   LMP 03/31/2017 (Exact Date) Comment: neg preg test 04/07/17  SpO2 94%   Physical Exam Constitutional: Oriented to person, place, and time. Well-developed and well-nourished. No distress.  HENT:  Head: Normocephalic and atraumatic.  Eyes: Conjunctivae and EOM are normal. Right eye exhibits no discharge. Left eye exhibits no discharge. No scleral icterus.  Neck: No JVD present.  Pulmonary/Chest: Effort normal. No stridor. No respiratory distress.  Abdomen: soft, obese, non distended, nontender Neurological:  alert and oriented to person, place, and time.  Skin: Skin is warm and dry.  not diaphoretic.  Psychiatric:   normal mood and affect.   behavior is normal. Judgment and thought content normal.   Mallampati Score:     Imaging: Ir Angiogram Visceral Selective  Result Date: 04/07/2017 CLINICAL DATA:  Symptomatic uterine fibroids. See previous consultation. EXAM: EXAM BILATERAL UTERINE ARTERY EMBOLIZATION TECHNIQUE: The procedure, risks, benefits, and alternatives were explained to the patient. Questions regarding the procedure were encouraged and answered. The patient understands and consents to the procedure. As antibiotic prophylaxis, vancomycin 1 g was ordered pre-procedure and administered intravenously within one hour of incision. An appropriate skin entry site was determined under fluoroscopy. Skin site was marked, prepped with chlorhexidine, and draped in  usual sterile fashion, and infiltrated locally with 1% lidocaine. Intravenous Fentanyl and Versed were administered as conscious sedation during continuous monitoring of the patient's level of consciousness and physiological / cardiorespiratory status by the radiology RN, with a total moderate sedation time of 64 minutes. Under real-time ultrasound guidance, the right common femoral artery was accessed with a micropuncture needle set using singlewall technique in a single pass. Ultrasound imaging documentation was saved. Micropuncture dilator exchanged over a Benson wire for a 5 [place] vascular sheath through which a C2 catheter was used to selectively catheterize the left internal iliac artery for pelvic arteriography. A coaxial micro catheter was advanced with the 018 wire and used to selectively catheterize the left uterine artery. The microcatheter tip was positioned in the distal horizontal segment. Selective arteriogram confirms appropriate positioning. Distal branches of the left uterine artery were embolized with 500-700 micron Embospheres. Embolization continued until near stasis of flow was achieved. A total of 4 vials of Embospheres were utilized for the case.Microcatheter  was withdrawn and a followup selective left internal iliac arteriogram was obtained. A Waltman loop was then formed with the C2 catheter, and the right internal iliac artery was selectively catheterized. Multiple selective right pelvic arteriograms were obtained. The right uterine artery was not localized. For this reason, the catheter was exchanged for a pigtail catheter, advanced into the infrarenal abdominal aorta for flush aortography. No ovarian arterial supply to the uterus was identified. The inferior mesenteric artery was selectively catheterized with Sos catheter, and selective arteriography showed no collateral supply to the uterine arterial system. The angiographic catheter was withdrawn. After confirmatory femoral angiogram,  sheath removed and using the Exoseal device hemostasis was achieved. Patient tolerated the procedure well. COMPLICATIONS: COMPLICATIONS none FLUOROSCOPY TIME:  14 minutes 12 second 2217 mGy IMPRESSION: 1. Technically successful left uterine artery embolization using 500-700 micron Embospheres. 2. Absent/atretic right uterine artery. No significant ovarian or other collateral supply to uterine fibroids identified. Electronically Signed   By: Lucrezia Europe M.D.   On: 04/07/2017 16:41   Ir Angiogram Pelvis Selective Or Supraselective  Result Date: 04/07/2017 CLINICAL DATA:  Symptomatic uterine fibroids. See previous consultation. EXAM: EXAM BILATERAL UTERINE ARTERY EMBOLIZATION TECHNIQUE: The procedure, risks, benefits, and alternatives were explained to the patient. Questions regarding the procedure were encouraged and answered. The patient understands and consents to the procedure. As antibiotic prophylaxis, vancomycin 1 g was ordered pre-procedure and administered intravenously within one hour of incision. An appropriate skin entry site was determined under fluoroscopy. Skin site was marked, prepped with chlorhexidine, and draped in usual sterile fashion, and infiltrated locally with 1% lidocaine. Intravenous Fentanyl and Versed were administered as conscious sedation during continuous monitoring of the patient's level of consciousness and physiological / cardiorespiratory status by the radiology RN, with a total moderate sedation time of 64 minutes. Under real-time ultrasound guidance, the right common femoral artery was accessed with a micropuncture needle set using singlewall technique in a single pass. Ultrasound imaging documentation was saved. Micropuncture dilator exchanged over a Benson wire for a 5 [place] vascular sheath through which a C2 catheter was used to selectively catheterize the left internal iliac artery for pelvic arteriography. A coaxial micro catheter was advanced with the 018 wire and used to  selectively catheterize the left uterine artery. The microcatheter tip was positioned in the distal horizontal segment. Selective arteriogram confirms appropriate positioning. Distal branches of the left uterine artery were embolized with 500-700 micron Embospheres. Embolization continued until near stasis of flow was achieved. A total of 4 vials of Embospheres were utilized for the case.Microcatheter was withdrawn and a followup selective left internal iliac arteriogram was obtained. A Waltman loop was then formed with the C2 catheter, and the right internal iliac artery was selectively catheterized. Multiple selective right pelvic arteriograms were obtained. The right uterine artery was not localized. For this reason, the catheter was exchanged for a pigtail catheter, advanced into the infrarenal abdominal aorta for flush aortography. No ovarian arterial supply to the uterus was identified. The inferior mesenteric artery was selectively catheterized with Sos catheter, and selective arteriography showed no collateral supply to the uterine arterial system. The angiographic catheter was withdrawn. After confirmatory femoral angiogram, sheath removed and using the Exoseal device hemostasis was achieved. Patient tolerated the procedure well. COMPLICATIONS: COMPLICATIONS none FLUOROSCOPY TIME:  14 minutes 12 second 2217 mGy IMPRESSION: 1. Technically successful left uterine artery embolization using 500-700 micron Embospheres. 2. Absent/atretic right uterine artery. No significant ovarian or other collateral supply to uterine fibroids identified. Electronically  Signed   By: Lucrezia Europe M.D.   On: 04/07/2017 16:41   Ir Angiogram Pelvis Selective Or Supraselective  Result Date: 04/07/2017 CLINICAL DATA:  Symptomatic uterine fibroids. See previous consultation. EXAM: EXAM BILATERAL UTERINE ARTERY EMBOLIZATION TECHNIQUE: The procedure, risks, benefits, and alternatives were explained to the patient. Questions regarding  the procedure were encouraged and answered. The patient understands and consents to the procedure. As antibiotic prophylaxis, vancomycin 1 g was ordered pre-procedure and administered intravenously within one hour of incision. An appropriate skin entry site was determined under fluoroscopy. Skin site was marked, prepped with chlorhexidine, and draped in usual sterile fashion, and infiltrated locally with 1% lidocaine. Intravenous Fentanyl and Versed were administered as conscious sedation during continuous monitoring of the patient's level of consciousness and physiological / cardiorespiratory status by the radiology RN, with a total moderate sedation time of 64 minutes. Under real-time ultrasound guidance, the right common femoral artery was accessed with a micropuncture needle set using singlewall technique in a single pass. Ultrasound imaging documentation was saved. Micropuncture dilator exchanged over a Benson wire for a 5 [place] vascular sheath through which a C2 catheter was used to selectively catheterize the left internal iliac artery for pelvic arteriography. A coaxial micro catheter was advanced with the 018 wire and used to selectively catheterize the left uterine artery. The microcatheter tip was positioned in the distal horizontal segment. Selective arteriogram confirms appropriate positioning. Distal branches of the left uterine artery were embolized with 500-700 micron Embospheres. Embolization continued until near stasis of flow was achieved. A total of 4 vials of Embospheres were utilized for the case.Microcatheter was withdrawn and a followup selective left internal iliac arteriogram was obtained. A Waltman loop was then formed with the C2 catheter, and the right internal iliac artery was selectively catheterized. Multiple selective right pelvic arteriograms were obtained. The right uterine artery was not localized. For this reason, the catheter was exchanged for a pigtail catheter, advanced into  the infrarenal abdominal aorta for flush aortography. No ovarian arterial supply to the uterus was identified. The inferior mesenteric artery was selectively catheterized with Sos catheter, and selective arteriography showed no collateral supply to the uterine arterial system. The angiographic catheter was withdrawn. After confirmatory femoral angiogram, sheath removed and using the Exoseal device hemostasis was achieved. Patient tolerated the procedure well. COMPLICATIONS: COMPLICATIONS none FLUOROSCOPY TIME:  14 minutes 12 second 2217 mGy IMPRESSION: 1. Technically successful left uterine artery embolization using 500-700 micron Embospheres. 2. Absent/atretic right uterine artery. No significant ovarian or other collateral supply to uterine fibroids identified. Electronically Signed   By: Lucrezia Europe M.D.   On: 04/07/2017 16:41   Ir Angiogram Selective Each Additional Vessel  Result Date: 04/07/2017 CLINICAL DATA:  Symptomatic uterine fibroids. See previous consultation. EXAM: EXAM BILATERAL UTERINE ARTERY EMBOLIZATION TECHNIQUE: The procedure, risks, benefits, and alternatives were explained to the patient. Questions regarding the procedure were encouraged and answered. The patient understands and consents to the procedure. As antibiotic prophylaxis, vancomycin 1 g was ordered pre-procedure and administered intravenously within one hour of incision. An appropriate skin entry site was determined under fluoroscopy. Skin site was marked, prepped with chlorhexidine, and draped in usual sterile fashion, and infiltrated locally with 1% lidocaine. Intravenous Fentanyl and Versed were administered as conscious sedation during continuous monitoring of the patient's level of consciousness and physiological / cardiorespiratory status by the radiology RN, with a total moderate sedation time of 64 minutes. Under real-time ultrasound guidance, the right common femoral artery was accessed with  a micropuncture needle set using  singlewall technique in a single pass. Ultrasound imaging documentation was saved. Micropuncture dilator exchanged over a Benson wire for a 5 [place] vascular sheath through which a C2 catheter was used to selectively catheterize the left internal iliac artery for pelvic arteriography. A coaxial micro catheter was advanced with the 018 wire and used to selectively catheterize the left uterine artery. The microcatheter tip was positioned in the distal horizontal segment. Selective arteriogram confirms appropriate positioning. Distal branches of the left uterine artery were embolized with 500-700 micron Embospheres. Embolization continued until near stasis of flow was achieved. A total of 4 vials of Embospheres were utilized for the case.Microcatheter was withdrawn and a followup selective left internal iliac arteriogram was obtained. A Waltman loop was then formed with the C2 catheter, and the right internal iliac artery was selectively catheterized. Multiple selective right pelvic arteriograms were obtained. The right uterine artery was not localized. For this reason, the catheter was exchanged for a pigtail catheter, advanced into the infrarenal abdominal aorta for flush aortography. No ovarian arterial supply to the uterus was identified. The inferior mesenteric artery was selectively catheterized with Sos catheter, and selective arteriography showed no collateral supply to the uterine arterial system. The angiographic catheter was withdrawn. After confirmatory femoral angiogram, sheath removed and using the Exoseal device hemostasis was achieved. Patient tolerated the procedure well. COMPLICATIONS: COMPLICATIONS none FLUOROSCOPY TIME:  14 minutes 12 second 2217 mGy IMPRESSION: 1. Technically successful left uterine artery embolization using 500-700 micron Embospheres. 2. Absent/atretic right uterine artery. No significant ovarian or other collateral supply to uterine fibroids identified. Electronically Signed    By: Lucrezia Europe M.D.   On: 04/07/2017 16:41   Ir Angiogram Selective Each Additional Vessel  Result Date: 04/07/2017 CLINICAL DATA:  Symptomatic uterine fibroids. See previous consultation. EXAM: EXAM BILATERAL UTERINE ARTERY EMBOLIZATION TECHNIQUE: The procedure, risks, benefits, and alternatives were explained to the patient. Questions regarding the procedure were encouraged and answered. The patient understands and consents to the procedure. As antibiotic prophylaxis, vancomycin 1 g was ordered pre-procedure and administered intravenously within one hour of incision. An appropriate skin entry site was determined under fluoroscopy. Skin site was marked, prepped with chlorhexidine, and draped in usual sterile fashion, and infiltrated locally with 1% lidocaine. Intravenous Fentanyl and Versed were administered as conscious sedation during continuous monitoring of the patient's level of consciousness and physiological / cardiorespiratory status by the radiology RN, with a total moderate sedation time of 64 minutes. Under real-time ultrasound guidance, the right common femoral artery was accessed with a micropuncture needle set using singlewall technique in a single pass. Ultrasound imaging documentation was saved. Micropuncture dilator exchanged over a Benson wire for a 5 [place] vascular sheath through which a C2 catheter was used to selectively catheterize the left internal iliac artery for pelvic arteriography. A coaxial micro catheter was advanced with the 018 wire and used to selectively catheterize the left uterine artery. The microcatheter tip was positioned in the distal horizontal segment. Selective arteriogram confirms appropriate positioning. Distal branches of the left uterine artery were embolized with 500-700 micron Embospheres. Embolization continued until near stasis of flow was achieved. A total of 4 vials of Embospheres were utilized for the case.Microcatheter was withdrawn and a followup selective  left internal iliac arteriogram was obtained. A Waltman loop was then formed with the C2 catheter, and the right internal iliac artery was selectively catheterized. Multiple selective right pelvic arteriograms were obtained. The right uterine artery was not localized.  For this reason, the catheter was exchanged for a pigtail catheter, advanced into the infrarenal abdominal aorta for flush aortography. No ovarian arterial supply to the uterus was identified. The inferior mesenteric artery was selectively catheterized with Sos catheter, and selective arteriography showed no collateral supply to the uterine arterial system. The angiographic catheter was withdrawn. After confirmatory femoral angiogram, sheath removed and using the Exoseal device hemostasis was achieved. Patient tolerated the procedure well. COMPLICATIONS: COMPLICATIONS none FLUOROSCOPY TIME:  14 minutes 12 second 2217 mGy IMPRESSION: 1. Technically successful left uterine artery embolization using 500-700 micron Embospheres. 2. Absent/atretic right uterine artery. No significant ovarian or other collateral supply to uterine fibroids identified. Electronically Signed   By: Lucrezia Europe M.D.   On: 04/07/2017 16:41   Ir US Guide Vasc Access Right  Result Date: 04/07/2017 CLINICAL DATA:  Symptomatic uterine fibroids. See previous consultation. EXAM: EXAM BILATERAL UTERINE ARTERY EMBOLIZATION TECHNIQUE: The procedure, risks, benefits, and alternatives were explained to the patient. Questions regarding the procedure were encouraged and answered. The patient understands and consents to the procedure. As antibiotic prophylaxis, vancomycin 1 g was ordered pre-procedure and administered intravenously within one hour of incision. An appropriate skin entry site was determined under fluoroscopy. Skin site was marked, prepped with chlorhexidine, and draped in usual sterile fashion, and infiltrated locally with 1% lidocaine. Intravenous Fentanyl and Versed were  administered as conscious sedation during continuous monitoring of the patient's level of consciousness and physiological / cardiorespiratory status by the radiology RN, with a total moderate sedation time of 64 minutes. Under real-time ultrasound guidance, the right common femoral artery was accessed with a micropuncture needle set using singlewall technique in a single pass. Ultrasound imaging documentation was saved. Micropuncture dilator exchanged over a Benson wire for a 5 [place] vascular sheath through which a C2 catheter was used to selectively catheterize the left internal iliac artery for pelvic arteriography. A coaxial micro catheter was advanced with the 018 wire and used to selectively catheterize the left uterine artery. The microcatheter tip was positioned in the distal horizontal segment. Selective arteriogram confirms appropriate positioning. Distal branches of the left uterine artery were embolized with 500-700 micron Embospheres. Embolization continued until near stasis of flow was achieved. A total of 4 vials of Embospheres were utilized for the case.Microcatheter was withdrawn and a followup selective left internal iliac arteriogram was obtained. A Waltman loop was then formed with the C2 catheter, and the right internal iliac artery was selectively catheterized. Multiple selective right pelvic arteriograms were obtained. The right uterine artery was not localized. For this reason, the catheter was exchanged for a pigtail catheter, advanced into the infrarenal abdominal aorta for flush aortography. No ovarian arterial supply to the uterus was identified. The inferior mesenteric artery was selectively catheterized with Sos catheter, and selective arteriography showed no collateral supply to the uterine arterial system. The angiographic catheter was withdrawn. After confirmatory femoral angiogram, sheath removed and using the Exoseal device hemostasis was achieved. Patient tolerated the procedure  well. COMPLICATIONS: COMPLICATIONS none FLUOROSCOPY TIME:  14 minutes 12 second 2217 mGy IMPRESSION: 1. Technically successful left uterine artery embolization using 500-700 micron Embospheres. 2. Absent/atretic right uterine artery. No significant ovarian or other collateral supply to uterine fibroids identified. Electronically Signed   By: Lucrezia Europe M.D.   On: 04/07/2017 16:41   Ir Embo Tumor Organ Ischemia Infarct Inc Guide Roadmapping  Result Date: 04/07/2017 CLINICAL DATA:  Symptomatic uterine fibroids. See previous consultation. EXAM: EXAM BILATERAL UTERINE ARTERY EMBOLIZATION TECHNIQUE: The  procedure, risks, benefits, and alternatives were explained to the patient. Questions regarding the procedure were encouraged and answered. The patient understands and consents to the procedure. As antibiotic prophylaxis, vancomycin 1 g was ordered pre-procedure and administered intravenously within one hour of incision. An appropriate skin entry site was determined under fluoroscopy. Skin site was marked, prepped with chlorhexidine, and draped in usual sterile fashion, and infiltrated locally with 1% lidocaine. Intravenous Fentanyl and Versed were administered as conscious sedation during continuous monitoring of the patient's level of consciousness and physiological / cardiorespiratory status by the radiology RN, with a total moderate sedation time of 64 minutes. Under real-time ultrasound guidance, the right common femoral artery was accessed with a micropuncture needle set using singlewall technique in a single pass. Ultrasound imaging documentation was saved. Micropuncture dilator exchanged over a Benson wire for a 5 [place] vascular sheath through which a C2 catheter was used to selectively catheterize the left internal iliac artery for pelvic arteriography. A coaxial micro catheter was advanced with the 018 wire and used to selectively catheterize the left uterine artery. The microcatheter tip was positioned in  the distal horizontal segment. Selective arteriogram confirms appropriate positioning. Distal branches of the left uterine artery were embolized with 500-700 micron Embospheres. Embolization continued until near stasis of flow was achieved. A total of 4 vials of Embospheres were utilized for the case.Microcatheter was withdrawn and a followup selective left internal iliac arteriogram was obtained. A Waltman loop was then formed with the C2 catheter, and the right internal iliac artery was selectively catheterized. Multiple selective right pelvic arteriograms were obtained. The right uterine artery was not localized. For this reason, the catheter was exchanged for a pigtail catheter, advanced into the infrarenal abdominal aorta for flush aortography. No ovarian arterial supply to the uterus was identified. The inferior mesenteric artery was selectively catheterized with Sos catheter, and selective arteriography showed no collateral supply to the uterine arterial system. The angiographic catheter was withdrawn. After confirmatory femoral angiogram, sheath removed and using the Exoseal device hemostasis was achieved. Patient tolerated the procedure well. COMPLICATIONS: COMPLICATIONS none FLUOROSCOPY TIME:  14 minutes 12 second 2217 mGy IMPRESSION: 1. Technically successful left uterine artery embolization using 500-700 micron Embospheres. 2. Absent/atretic right uterine artery. No significant ovarian or other collateral supply to uterine fibroids identified. Electronically Signed   By: Lucrezia Europe M.D.   On: 04/07/2017 16:41    Labs:  CBC:  Recent Labs  09/20/16 2230 04/07/17 1133  WBC 5.4 4.8  HGB 12.8 11.7*  HCT 37.2 33.7*  PLT 237 245    COAGS:  Recent Labs  04/07/17 1133  INR 1.02  APTT 28    BMP:  Recent Labs  09/20/16 2230 01/16/17 0908 04/07/17 1133  NA 137  --  142  K 3.2*  --  3.3*  CL 101  --  108  CO2 27  --  26  GLUCOSE 131*  --  101*  BUN 13  --  11  CALCIUM 8.6*  --   8.7*  CREATININE 0.64 0.59 0.53  GFRNONAA >60 >60 >60  GFRAA >60 >60 >60    LIVER FUNCTION TESTS: No results for input(s): BILITOT, AST, ALT, ALKPHOS, PROT, ALBUMIN in the last 8760 hours.  TUMOR MARKERS: No results for input(s): AFPTM, CEA, CA199, CHROMGRNA in the last 8760 hours.  Assessment and Plan:  My impression is that the patient has done well post uterine fibroid embolization. Her post embolization   syndrome symptoms have largely resolved. She's  back to full energy and baseline. I have no restrictions on work, physical, sexual activity.  We again reviewed the time course resolution of uterine fibroids and typical 3-six-month involution. I did caution her to let me know right away should she have any tissue passage that might suggest some mucosal fibroid fragmentation requiring D&C, a rare event. Otherwise I expect her symptoms to continue to improve as the fibroids involute. We'll see her back at the 6 month follow-up. She knows to telephone with any interval questions or problems.  Thank you for this interesting consult.  I greatly enjoyed meeting Ashley Davis and look forward to participating in their care.  A copy of this report was sent to the requesting provider on this date.  Electronically Signed: Tyshon Fanning III, DAYNE Ashley Davis 04/30/2017, 11:46 AM   I spent a total of    25 Minutes in face to face in clinical consultation, greater than 50% of which was counseling/coordinating care for uterine fibroids, postembolization.

## 2017-06-15 ENCOUNTER — Encounter: Payer: Self-pay | Admitting: Interventional Radiology

## 2017-08-04 ENCOUNTER — Emergency Department (HOSPITAL_COMMUNITY)
Admission: EM | Admit: 2017-08-04 | Discharge: 2017-08-04 | Disposition: A | Discharge status: Home / Self Care | Payer: BLUE CROSS/BLUE SHIELD | Attending: Emergency Medicine | Admitting: Emergency Medicine

## 2017-08-04 ENCOUNTER — Emergency Department (HOSPITAL_COMMUNITY): Payer: BLUE CROSS/BLUE SHIELD

## 2017-08-04 ENCOUNTER — Encounter (HOSPITAL_COMMUNITY): Payer: Self-pay

## 2017-08-04 DIAGNOSIS — I10 Essential (primary) hypertension: Secondary | ICD-10-CM | POA: Insufficient documentation

## 2017-08-04 DIAGNOSIS — Z87891 Personal history of nicotine dependence: Secondary | ICD-10-CM | POA: Insufficient documentation

## 2017-08-04 DIAGNOSIS — Z8673 Personal history of transient ischemic attack (TIA), and cerebral infarction without residual deficits: Secondary | ICD-10-CM | POA: Insufficient documentation

## 2017-08-04 DIAGNOSIS — R0789 Other chest pain: Secondary | ICD-10-CM | POA: Diagnosis not present

## 2017-08-04 DIAGNOSIS — J45909 Unspecified asthma, uncomplicated: Secondary | ICD-10-CM | POA: Insufficient documentation

## 2017-08-04 DIAGNOSIS — R079 Chest pain, unspecified: Secondary | ICD-10-CM

## 2017-08-04 DIAGNOSIS — Z79899 Other long term (current) drug therapy: Secondary | ICD-10-CM | POA: Insufficient documentation

## 2017-08-04 LAB — CBC
HCT: 36.3 % (ref 36.0–46.0)
HEMOGLOBIN: 12.3 g/dL (ref 12.0–15.0)
MCH: 28.9 pg (ref 26.0–34.0)
MCHC: 33.9 g/dL (ref 30.0–36.0)
MCV: 85.4 fL (ref 78.0–100.0)
PLATELETS: 215 10*3/uL (ref 150–400)
RBC: 4.25 MIL/uL (ref 3.87–5.11)
RDW: 13.8 % (ref 11.5–15.5)
WBC: 3.6 10*3/uL — AB (ref 4.0–10.5)

## 2017-08-04 LAB — BASIC METABOLIC PANEL
ANION GAP: 8 (ref 5–15)
BUN: 8 mg/dL (ref 6–20)
CALCIUM: 8.9 mg/dL (ref 8.9–10.3)
CHLORIDE: 107 mmol/L (ref 101–111)
CO2: 24 mmol/L (ref 22–32)
CREATININE: 0.53 mg/dL (ref 0.44–1.00)
GFR calc non Af Amer: >60 mL/min (ref >60)
Glucose, Bld: 100 mg/dL — ABNORMAL HIGH (ref 65–99)
Potassium: 3.2 mmol/L — ABNORMAL LOW (ref 3.5–5.1)
SODIUM: 139 mmol/L (ref 135–145)

## 2017-08-04 LAB — I-STAT BETA HCG BLOOD, ED (MC, WL, AP ONLY)

## 2017-08-04 LAB — I-STAT TROPONIN, ED
TROPONIN I, POC: 0 ng/mL (ref 0.00–0.08)
Troponin i, poc: 0 ng/mL (ref 0.00–0.08)

## 2017-08-04 MED ORDER — ASPIRIN 81 MG PO CHEW
324.0000 mg | CHEWABLE_TABLET | Freq: Once | ORAL | Status: AC
Start: 2017-08-04 — End: 2017-08-04
  Administered 2017-08-04: 324 mg via ORAL
  Filled 2017-08-04: qty 4

## 2017-08-04 MED ORDER — NITROGLYCERIN 0.4 MG SL SUBL: 0.4000 mg | SUBLINGUAL_TABLET | SUBLINGUAL | Status: DC | PRN

## 2017-08-04 NOTE — Discharge Instructions (Signed)
You were seen in the emergency department for chest pain.  Your EKG did not show any signs concerning for a heart attack.  Your cardiac enzymes were negative for signs of a heart attack.  Your lab work did not show any signs of infection or anemia.  Your kidney function and electrolytes were normal with the exception that your potassium was a bit low at 3.2, normal is 3.5-5.1.  Be sure to include plenty of high potassium foods in your diet.  Chest x-ray did not show any abnormalities.  Your blood pressure was elevated in the emergency department today.  You will need follow-up with your primary care provider in the next 2 days for reevaluation of your chest pain as well as adjustment of your blood pressure medications.  Also provided information for a cardiologist in your discharge instructions.  Please call and make an appointment within the next week for follow-up.  Return to the emergency department at anytime for any new or worsening symptoms including but not limited to worsening chest pain, difficulty breathing, passing out.

## 2017-08-04 NOTE — ED Triage Notes (Signed)
Patient complains of 3 days of left arm pain with chest tightness and stating that she needs to burp. denies cold symptoms, denies trauma. Complains of fatigue with same. Alert and oriented

## 2017-08-04 NOTE — ED Provider Notes (Signed)
[facility] EMERGENCY DEPARTMENT Provider Note   CSN: [id number] Arrival date & time: 08/04/17  1400     History   Chief Complaint Chief Complaint  Patient presents with  . Chest Pain    HPI Ashley Davis is a 51 y.o. female with a history of hypertension presents with chief complaint of chest pain for the past 3 days.  Patient describes her discomfort as a constant pressure/heaviness to the mid chest with additional sharp pains to the mid to left chest that radiate down her left upper extremity.  Reports associated diaphoresis palpitations described as feeling as though her heart is racing or beating too hard.  States symptoms started when she was at rest.  Notes that she is fatigued with activities that she typically does without difficulty.  States the palpitations and chest heaviness are worsened with food.  She has tried ibuprofen and Tylenol without relief.  She has also tried drinking soda to get herself to belch, this does not alleviate symptoms.  She has no history of similar.  Patient has been taking her blood pressure at home and has noted that it is been elevated over the past few days.  Typically this is not elevated.  Denies shortness of breath, nausea, vomiting, abdominal pain, or leg swelling.  HPI  Past Medical History:  Diagnosis Date  . Asthma   . Depression   . History of bronchitis   . Hypertension    Pt states her HTN is due to her migraines  . Migraine   . Stroke [facility])     Patient Active Problem List   Diagnosis Date Noted  . Fibroids 04/08/2017  . Complicated migraine 20/94/7096  . Asthma, chronic 04/27/2014  . Hemiplegia, unspecified, affecting nondominant side 04/26/2014  . Weakness 04/24/2014    Past Surgical History:  Procedure Laterality Date  . IR ANGIOGRAM PELVIS SELECTIVE OR SUPRASELECTIVE  04/07/2017  . IR ANGIOGRAM PELVIS SELECTIVE OR SUPRASELECTIVE  04/07/2017  . IR ANGIOGRAM SELECTIVE EACH ADDITIONAL VESSEL  04/07/2017    . IR ANGIOGRAM SELECTIVE EACH ADDITIONAL VESSEL  04/07/2017  . IR ANGIOGRAM VISCERAL SELECTIVE  04/07/2017  . IR EMBO TUMOR ORGAN ISCHEMIA INFARCT INC GUIDE ROADMAPPING  04/07/2017  . IR RADIOLOGIST EVAL & MGMT  01/01/2017  . IR RADIOLOGIST EVAL & MGMT  04/30/2017  . IR US GUIDE VASC ACCESS RIGHT  04/07/2017  . NO PAST SURGERIES      OB History    No data available       Home Medications    Prior to Admission medications   Medication Sig Start Date End Date Taking? Authorizing Provider  aspirin-acetaminophen-caffeine (EXCEDRIN MIGRAINE) [phone] MG tablet Take 1-3 tablets by mouth every 6 (six) hours as needed for headache or migraine.   Yes [provider]  diphenhydrAMINE (BENADRYL) 25 MG tablet Take 50 mg by mouth daily as needed for allergies.    Yes [provider]  losartan (COZAAR) 50 MG tablet Take 50 mg by mouth daily.   Yes [provider]  ibuprofen (ADVIL,MOTRIN) 800 MG tablet Take 1 tablet (800 mg total) by mouth 4 (four) times daily. Patient not taking: Reported on 08/04/2017 04/08/17   Ascencion Dike, PA-C    Family History Family History  Problem Relation Age of Onset  . Diabetes Mother   . Stroke Mother   . Cancer Mother   . Headache Mother   . Diabetes Father   . Other Father  neuropathy  . Headache Sister   . Aneurysm Sister        3 brain surgeries    Social History Social History   Tobacco Use  . Smoking status: Former Smoker    Packs/day: 0.25    Years: 30.00    Pack years: 7.50    Types: Cigarettes  . Smokeless tobacco: Never Used  Substance Use Topics  . Alcohol use: Yes    Comment: rarely  . Drug use: No     Allergies   Augmentin [amoxicillin-pot clavulanate] and Shellfish allergy   Review of Systems Review of Systems  Constitutional: Positive for fatigue. Negative for chills and fever.  Eyes: Negative for visual disturbance.  Respiratory: Negative for shortness of breath.   Cardiovascular: Positive for  chest pain and palpitations. Negative for leg swelling.  Gastrointestinal: Negative for abdominal pain, constipation, diarrhea, nausea and vomiting.  Genitourinary: Negative for dysuria.  Musculoskeletal: Negative for back pain.  Neurological: Negative for weakness, light-headedness and numbness.  All other systems reviewed and are negative.    Physical Exam Updated Vital Signs BP (!) 169/99 (BP Location: Right Arm)   Pulse 71   Temp 98.7 F (37.1 C) (Oral)   Resp 18   LMP 03/31/2017 (Exact Date) Comment: neg preg test 04/07/17  SpO2 98%   Physical Exam  Constitutional: She appears well-developed and well-nourished. No distress.  HENT:  Head: Normocephalic and atraumatic.  Eyes: Conjunctivae are normal. Right eye exhibits no discharge. Left eye exhibits no discharge.  Cardiovascular: Normal rate and regular rhythm. Exam reveals no gallop and no friction rub.  No murmur heard. Pulses:      Radial pulses are 2+ on the right side, and 2+ on the left side.       Dorsalis pedis pulses are 2+ on the right side, and 2+ on the left side.  Pulmonary/Chest: Breath sounds normal. No respiratory distress. She has no wheezes. She has no rales.  Abdominal: Soft. She exhibits no distension. There is no tenderness. There is no rebound and no guarding.  Musculoskeletal:       Right lower leg: She exhibits no edema.  Neurological:  Alert. Clear speech. No facial droop. CNIII-XII are intact. Bilateral upper and lower extremities' sensation intact to sharp and dull touch. 5/5 grip strength bilaterally. 5/5 plantar and dorsi flexion bilaterally.  Skin: Skin is warm and dry. Capillary refill takes less than 2 seconds. No rash noted.  Psychiatric: She has a normal mood and affect. Her behavior is normal.  Nursing note and vitals reviewed.   ED Treatments / Results  Labs Results for orders placed or performed during the hospital encounter of 34/19/62  Basic metabolic panel  Result Value Ref Range     Sodium 139 135 - 145 mmol/L   Potassium 3.2 (L) 3.5 - 5.1 mmol/L   Chloride 107 101 - 111 mmol/L   CO2 24 22 - 32 mmol/L   Glucose, Bld 100 (H) 65 - 99 mg/dL   BUN 8 6 - 20 mg/dL   Creatinine, Ser 0.53 0.44 - 1.00 mg/dL   Calcium 8.9 8.9 - 10.3 mg/dL   GFR calc non Af Amer >60 >60 mL/min   GFR calc Af Amer >60 >60 mL/min   Anion gap 8 5 - 15  CBC  Result Value Ref Range   WBC 3.6 (L) 4.0 - 10.5 K/uL   RBC 4.25 3.87 - 5.11 MIL/uL   Hemoglobin 12.3 12.0 - 15.0 g/dL   HCT 36.3  36.0 - 46.0 %   MCV 85.4 78.0 - 100.0 fL   MCH 28.9 26.0 - 34.0 pg   MCHC 33.9 30.0 - 36.0 g/dL   RDW 13.8 11.5 - 15.5 %   Platelets 215 150 - 400 K/uL  I-stat troponin, ED  Result Value Ref Range   Troponin i, poc 0.00 0.00 - 0.08 ng/mL   Comment 3          I-Stat beta hCG blood, ED  Result Value Ref Range   I-stat hCG, quantitative <5.0 <5 mIU/mL   Comment 3          I-stat troponin, ED  Result Value Ref Range   Troponin i, poc 0.00 0.00 - 0.08 ng/mL   Comment 3           EKG  EKG Interpretation  Date/Time:  Tuesday August 04 2017 14:04:40 EST Ventricular Rate:  71 PR Interval:  182 QRS Duration: 90 QT Interval:  406 QTC Calculation: 441 R Axis:   -16 Text Interpretation:  Normal sinus rhythm no acute ST/T changes no significant change since Jan 2018 Confirmed by Sherwood Gambler [phone]) on 08/04/2017 7:32:46 PM       Radiology Dg Chest 2 View  Result Date: 08/04/2017 CLINICAL DATA:  Chest heaviness.  Erratic heartbeat. EXAM: CHEST  2 VIEW COMPARISON:  None. FINDINGS: Mediastinum and hilar structures are normal. Cardiomegaly with normal pulmonary vascularity. No focal pulmonary infiltrate. No pleural effusion or pneumothorax. IMPRESSION: No acute cardiopulmonary disease. Electronically Signed   By: Marcello Moores  Register   On: 08/04/2017 15:39    Procedures Procedures (including critical care time)  Medications Ordered in ED Medications  nitroGLYCERIN (NITROSTAT) SL tablet 0.4 mg (not  administered)  aspirin chewable tablet 324 mg (324 mg Oral Given 08/04/17 2115)    Initial Impression / Assessment and Plan / ED Course  I have reviewed the triage vital signs and the nursing notes.  Pertinent labs & imaging results that were available during my care of the patient were reviewed by me and considered in my medical decision making (see chart for details).    Patient presents with complaint of chest pain for the past 3 days.  Patient is nontoxic-appearing, noted to be hypertensive, this improved throughout her emergency department stay.  Patient's presenatation and past medical history concerning for ACS, HEART path score of 4.  EKG without ST/T changes, serial troponins negative therefore doubt STEMI/NSTEMI. Wells criteria for pulmonary embolism score of 0, patient is a low risk group, doubt pulmonary embolism.  Patient with 2+ symmetric radial pulses, no widening of the mediastinum on chest x-ray, doubt dissection.  Screening labs grossly normal, potassium of 3.2- recommend increased high potassium foods in diet.  Chest x-ray without acute cardiopulmonary process. Discussed admission for observation versus outpatient follow-up with primary care and cardiology with the patient and her husband.  Patient would like to follow-up outpatient. Also discussed need for blood pressure recheck with PCP for possible medication adjustment. I discussed results, treatment plan, need for PCP follow-up and cardiology follow up, and return precautions with the patient and her husband. Provided opportunity for questions, patient and her husband confirmed understanding and are in agreement with plan.   Findings and plan of care discussed with supervising physician Dr. Morton Amy who personally evaluated and examined this patient and is in agreement with plan.   Vitals:   08/04/17 2242 08/04/17 2305  BP:  (!) 142/89  Pulse: 69   Resp: (!) 24   Temp:  SpO2: 100%     Final Clinical Impressions(s) / ED  Diagnoses   Final diagnoses:  Chest pain, unspecified type    ED Discharge Orders    None       Amaryllis Dyke, PA-C 08/05/17 0026    Sherwood Gambler, MD 08/06/17 [phone]

## 2017-08-05 DIAGNOSIS — R0789 Other chest pain: Secondary | ICD-10-CM | POA: Diagnosis not present

## 2017-08-05 DIAGNOSIS — I10 Essential (primary) hypertension: Secondary | ICD-10-CM | POA: Diagnosis not present

## 2017-08-19 DIAGNOSIS — R0789 Other chest pain: Secondary | ICD-10-CM | POA: Diagnosis not present

## 2017-08-19 DIAGNOSIS — R079 Chest pain, unspecified: Secondary | ICD-10-CM | POA: Diagnosis not present

## 2017-08-19 DIAGNOSIS — I10 Essential (primary) hypertension: Secondary | ICD-10-CM | POA: Diagnosis not present

## 2017-08-28 DIAGNOSIS — G629 Polyneuropathy, unspecified: Secondary | ICD-10-CM | POA: Diagnosis not present

## 2017-08-28 DIAGNOSIS — I10 Essential (primary) hypertension: Secondary | ICD-10-CM | POA: Diagnosis not present

## 2017-08-28 DIAGNOSIS — I119 Hypertensive heart disease without heart failure: Secondary | ICD-10-CM | POA: Diagnosis not present

## 2017-08-28 DIAGNOSIS — R0789 Other chest pain: Secondary | ICD-10-CM | POA: Diagnosis not present

## 2017-09-21 ENCOUNTER — Other Ambulatory Visit (HOSPITAL_COMMUNITY): Payer: Self-pay | Admitting: Interventional Radiology

## 2017-09-21 ENCOUNTER — Other Ambulatory Visit: Payer: Self-pay | Admitting: Radiology

## 2017-09-21 DIAGNOSIS — D219 Benign neoplasm of connective and other soft tissue, unspecified: Secondary | ICD-10-CM

## 2017-09-21 MED ORDER — DIAZEPAM 5 MG PO TABS: 5.0000 mg | ORAL_TABLET | ORAL | 0 refills | Status: AC

## 2017-10-07 ENCOUNTER — Ambulatory Visit: Payer: BLUE CROSS/BLUE SHIELD

## 2017-10-07 ENCOUNTER — Ambulatory Visit (HOSPITAL_COMMUNITY)
Admission: RE | Admit: 2017-10-07 | Discharge: 2017-10-07 | Disposition: A | Discharge status: Home / Self Care | Payer: BLUE CROSS/BLUE SHIELD | Source: Ambulatory Visit | Attending: Interventional Radiology | Admitting: Interventional Radiology

## 2017-10-07 ENCOUNTER — Encounter (HOSPITAL_COMMUNITY): Payer: Self-pay

## 2017-10-07 ENCOUNTER — Other Ambulatory Visit (HOSPITAL_COMMUNITY): Payer: Self-pay | Admitting: Interventional Radiology

## 2017-10-07 DIAGNOSIS — D219 Benign neoplasm of connective and other soft tissue, unspecified: Secondary | ICD-10-CM

## 2017-10-21 ENCOUNTER — Ambulatory Visit (HOSPITAL_COMMUNITY): Admission: RE | Admit: 2017-10-21 | Payer: BLUE CROSS/BLUE SHIELD | Source: Ambulatory Visit

## 2017-10-27 ENCOUNTER — Other Ambulatory Visit: Payer: BLUE CROSS/BLUE SHIELD

## 2017-10-28 ENCOUNTER — Telehealth: Payer: Self-pay | Admitting: *Deleted

## 2017-10-28 ENCOUNTER — Institutional Professional Consult (permissible substitution): Payer: BLUE CROSS/BLUE SHIELD | Admitting: Diagnostic Neuroimaging

## 2017-10-28 NOTE — Telephone Encounter (Signed)
Patient was no show for new patient appointment today. 

## 2017-10-29 ENCOUNTER — Encounter: Payer: Self-pay | Admitting: Diagnostic Neuroimaging

## 2017-11-28 ENCOUNTER — Emergency Department (HOSPITAL_BASED_OUTPATIENT_CLINIC_OR_DEPARTMENT_OTHER)
Admit: 2017-11-28 | Discharge: 2017-11-28 | Disposition: A | Discharge status: Home / Self Care | Payer: BLUE CROSS/BLUE SHIELD | Attending: Emergency Medicine | Admitting: Emergency Medicine

## 2017-11-28 ENCOUNTER — Encounter (HOSPITAL_COMMUNITY): Payer: Self-pay

## 2017-11-28 ENCOUNTER — Emergency Department (HOSPITAL_COMMUNITY)
Admission: EM | Admit: 2017-11-28 | Discharge: 2017-11-28 | Disposition: A | Discharge status: Home / Self Care | Payer: BLUE CROSS/BLUE SHIELD | Attending: Emergency Medicine | Admitting: Emergency Medicine

## 2017-11-28 DIAGNOSIS — I10 Essential (primary) hypertension: Secondary | ICD-10-CM | POA: Diagnosis not present

## 2017-11-28 DIAGNOSIS — Z79899 Other long term (current) drug therapy: Secondary | ICD-10-CM | POA: Insufficient documentation

## 2017-11-28 DIAGNOSIS — M25561 Pain in right knee: Secondary | ICD-10-CM | POA: Diagnosis not present

## 2017-11-28 DIAGNOSIS — J45909 Unspecified asthma, uncomplicated: Secondary | ICD-10-CM | POA: Insufficient documentation

## 2017-11-28 DIAGNOSIS — M79609 Pain in unspecified limb: Secondary | ICD-10-CM

## 2017-11-28 DIAGNOSIS — M79604 Pain in right leg: Secondary | ICD-10-CM | POA: Diagnosis not present

## 2017-11-28 DIAGNOSIS — Z87891 Personal history of nicotine dependence: Secondary | ICD-10-CM | POA: Insufficient documentation

## 2017-11-28 DIAGNOSIS — M79661 Pain in right lower leg: Secondary | ICD-10-CM | POA: Diagnosis not present

## 2017-11-28 NOTE — Progress Notes (Addendum)
VASCULAR LAB PRELIMINARY  PRELIMINARY  PRELIMINARY  PRELIMINARY  Right lower extremity venous duplex completed.    Preliminary report:  There is no DVT, SVT, or Baker's cyst noted in the right lower extremity.  Gave report to Dr. Heloise Purpura, [facility], RVT 11/28/2017, 12:35 PM

## 2017-11-28 NOTE — ED Triage Notes (Signed)
Patient complains of 1 week of right calf pain and swelling, describes as cramp. Denies trauma, no relief with otc meds. No CP, no SOB

## 2017-11-28 NOTE — ED Provider Notes (Signed)
[facility] EMERGENCY DEPARTMENT Provider Note   CSN: [id number] Arrival date & time: 11/28/17  1049     History   Chief Complaint No chief complaint on file.   HPI Ashley Davis is a 52 y.o. female.  Patient with history of asthma, depression, migraine, stroke, family history of peripheral vascular diseasepresents with a right posterior knee pain for the past week. Deep cramping. Improves with ambulation. Patient had milder version in the past. No direct injuries. No fever swelling or trauma.past smoker     Past Medical History:  Diagnosis Date  . Asthma   . Depression   . History of bronchitis   . Hypertension    Pt states her HTN is due to her migraines  . Migraine   . Stroke [facility])     Patient Active Problem List   Diagnosis Date Noted  . Fibroids 04/08/2017  . Complicated migraine 44/09/270  . Asthma, chronic 04/27/2014  . Hemiplegia, unspecified, affecting nondominant side 04/26/2014  . Weakness 04/24/2014    Past Surgical History:  Procedure Laterality Date  . IR ANGIOGRAM PELVIS SELECTIVE OR SUPRASELECTIVE  04/07/2017  . IR ANGIOGRAM PELVIS SELECTIVE OR SUPRASELECTIVE  04/07/2017  . IR ANGIOGRAM SELECTIVE EACH ADDITIONAL VESSEL  04/07/2017  . IR ANGIOGRAM SELECTIVE EACH ADDITIONAL VESSEL  04/07/2017  . IR ANGIOGRAM VISCERAL SELECTIVE  04/07/2017  . IR EMBO TUMOR ORGAN ISCHEMIA INFARCT INC GUIDE ROADMAPPING  04/07/2017  . IR RADIOLOGIST EVAL & MGMT  01/01/2017  . IR RADIOLOGIST EVAL & MGMT  04/30/2017  . IR US GUIDE VASC ACCESS RIGHT  04/07/2017  . NO PAST SURGERIES       OB History   None      Home Medications    Prior to Admission medications   Medication Sig Start Date End Date Taking? Authorizing Provider  aspirin-acetaminophen-caffeine (EXCEDRIN MIGRAINE) [phone] MG tablet Take 1-3 tablets by mouth every 6 (six) hours as needed for headache or migraine.    [provider]  diazepam (VALIUM) 5 MG tablet Take 1 tablet (5 mg  total) by mouth as directed. Take 10 mg (2 tabs) by mouth 30 mins prior to MR.  May take additional 5 mg by mouth if needed.  Patient's husband will be available to provide transportation. 09/21/17 09/21/18  Arne Cleveland, MD  diphenhydrAMINE (BENADRYL) 25 MG tablet Take 50 mg by mouth daily as needed for allergies.     [provider]  losartan (COZAAR) 50 MG tablet Take 50 mg by mouth daily.    [provider]    Family History Family History  Problem Relation Age of Onset  . Diabetes Mother   . Stroke Mother   . Cancer Mother   . Headache Mother   . Diabetes Father   . Other Father        neuropathy  . Headache Sister   . Aneurysm Sister        3 brain surgeries    Social History Social History   Tobacco Use  . Smoking status: Former Smoker    Packs/day: 0.25    Years: 30.00    Pack years: 7.50    Types: Cigarettes  . Smokeless tobacco: Never Used  Substance Use Topics  . Alcohol use: Yes    Comment: rarely  . Drug use: No     Allergies   Augmentin [amoxicillin-pot clavulanate] and Shellfish allergy   Review of Systems Review of Systems  Constitutional: Negative for chills and fever.  HENT: Negative for congestion.   Eyes: Negative for visual disturbance.  Respiratory: Negative for shortness of breath.   Cardiovascular: Negative for chest pain.  Gastrointestinal: Negative for abdominal pain and vomiting.  Genitourinary: Negative for dysuria and flank pain.  Musculoskeletal: Negative for back pain, joint swelling, neck pain and neck stiffness.  Skin: Negative for rash.  Neurological: Negative for light-headedness and headaches.     Physical Exam Updated Vital Signs BP (!) 143/86 (BP Location: Left Arm)   Pulse 84   Temp 98.3 F (36.8 C) (Oral)   Resp 20   LMP 03/31/2017 (Exact Date) Comment: neg preg test 04/07/17  SpO2 99%   Physical Exam  Constitutional: She is oriented to person, place, and time. She appears well-developed and  well-nourished.  HENT:  Head: Normocephalic and atraumatic.  Eyes: Conjunctivae are normal. Right eye exhibits no discharge. Left eye exhibits no discharge.  Neck: Normal range of motion. Neck supple. No tracheal deviation present.  Cardiovascular: Normal rate.  Pulmonary/Chest: Effort normal.  Abdominal: Soft. She exhibits no distension. There is no tenderness. There is no guarding.  Musculoskeletal: She exhibits tenderness. She exhibits no edema.  Patient has mild tenderness posterior right knee without signs of infection and no edema. Patient has 2+ pulses posterior tibial and dorsalis pedis in the right leg. No external rashes.  Neurological: She is alert and oriented to person, place, and time.  Skin: Skin is warm. No rash noted.  Psychiatric: She has a normal mood and affect.  Nursing note and vitals reviewed.    ED Treatments / Results  Labs (all labs ordered are listed, but only abnormal results are displayed) Labs Reviewed - No data to display  EKG None  Radiology No results found.  Procedures Procedures (including critical care time)  Medications Ordered in ED Medications - No data to display   Initial Impression / Assessment and Plan / ED Course  I have reviewed the triage vital signs and the nursing notes.  Pertinent labs & imaging results that were available during my care of the patient were reviewed by me and considered in my medical decision making (see chart for details).    Patient with history of stroke early on aspirin presents with right leg cramping. Patient ultrasound done without signs of cyst or DVT. Patient has normal distal arterial pulses. No signs of infection. Discussed likely musculoskeletal close follow up outpatient primary doctor. Ultrasound results reviewed negative.  Final Clinical Impressions(s) / ED Diagnoses   Final diagnoses:  Right leg pain    ED Discharge Orders    None       Elnora Morrison, MD 11/28/17 1705

## 2017-11-28 NOTE — Discharge Instructions (Addendum)
If you were given medicines take as directed.  If you are on coumadin or contraceptives realize their levels and effectiveness is altered by many different medicines.  If you have any reaction (rash, tongues swelling, other) to the medicines stop taking and see a physician.    If your blood pressure was elevated in the ER make sure you follow up for management with a primary doctor or return for chest pain, shortness of breath or stroke symptoms.  Please follow up as directed and return to the ER or see a physician for new or worsening symptoms.  Thank you. Vitals:   11/28/17 1057  BP: (!) 143/86  Pulse: 84  Resp: 20  Temp: 98.3 F (36.8 C)  TempSrc: Oral  SpO2: 99%

## 2017-12-08 ENCOUNTER — Other Ambulatory Visit: Payer: Self-pay | Admitting: *Deleted

## 2017-12-08 ENCOUNTER — Other Ambulatory Visit (HOSPITAL_COMMUNITY): Payer: Self-pay | Admitting: Interventional Radiology

## 2017-12-08 DIAGNOSIS — D250 Submucous leiomyoma of uterus: Secondary | ICD-10-CM

## 2017-12-14 ENCOUNTER — Ambulatory Visit (HOSPITAL_COMMUNITY)
Admission: RE | Admit: 2017-12-14 | Discharge: 2017-12-14 | Disposition: A | Discharge status: Home / Self Care | Payer: BLUE CROSS/BLUE SHIELD | Source: Ambulatory Visit | Attending: Interventional Radiology | Admitting: Interventional Radiology

## 2017-12-14 DIAGNOSIS — D259 Leiomyoma of uterus, unspecified: Secondary | ICD-10-CM | POA: Diagnosis not present

## 2017-12-14 DIAGNOSIS — D250 Submucous leiomyoma of uterus: Secondary | ICD-10-CM | POA: Insufficient documentation

## 2017-12-14 LAB — CREATININE, SERUM
Creatinine, Ser: 0.63 mg/dL (ref 0.44–1.00)
GFR calc non Af Amer: >60 mL/min (ref >60)

## 2017-12-14 MED ORDER — GADOBENATE DIMEGLUMINE 529 MG/ML IV SOLN
20.0000 mL | Freq: Once | INTRAVENOUS | Status: AC
  Administered 2017-12-14: 20 mL via INTRAVENOUS

## 2017-12-15 ENCOUNTER — Other Ambulatory Visit: Payer: BLUE CROSS/BLUE SHIELD

## 2018-01-06 ENCOUNTER — Other Ambulatory Visit: Payer: BLUE CROSS/BLUE SHIELD

## 2018-02-08 ENCOUNTER — Ambulatory Visit: Payer: BLUE CROSS/BLUE SHIELD | Admitting: Diagnostic Neuroimaging

## 2018-02-17 ENCOUNTER — Ambulatory Visit: Payer: BLUE CROSS/BLUE SHIELD | Admitting: Allergy

## 2018-03-22 ENCOUNTER — Ambulatory Visit: Payer: BLUE CROSS/BLUE SHIELD | Admitting: Allergy

## 2018-04-06 ENCOUNTER — Encounter: Payer: Self-pay | Admitting: *Deleted

## 2018-04-07 ENCOUNTER — Ambulatory Visit: Payer: BLUE CROSS/BLUE SHIELD | Admitting: Diagnostic Neuroimaging

## 2018-04-07 ENCOUNTER — Telehealth: Payer: Self-pay | Admitting: *Deleted

## 2018-04-07 NOTE — Telephone Encounter (Signed)
Patient was no show for appointment today. She was no show for appointment in Feb 2019 and canceled appointment in June 2019.  Per Dr Leta Baptist, patient to be dismissed. A Atkins, manager notified.

## 2018-04-08 ENCOUNTER — Encounter: Payer: Self-pay | Admitting: Diagnostic Neuroimaging

## 2018-04-08 ENCOUNTER — Telehealth: Payer: Self-pay | Admitting: Diagnostic Neuroimaging

## 2018-04-08 NOTE — Telephone Encounter (Signed)
FYI patient has had 2 new patient no shows in 2019

## 2018-04-08 NOTE — Telephone Encounter (Signed)
Noted, see note dated 04/07/18.

## 2018-08-09 DIAGNOSIS — I10 Essential (primary) hypertension: Secondary | ICD-10-CM | POA: Diagnosis not present

## 2018-08-09 DIAGNOSIS — R0683 Snoring: Secondary | ICD-10-CM | POA: Diagnosis not present

## 2018-08-09 DIAGNOSIS — E669 Obesity, unspecified: Secondary | ICD-10-CM | POA: Diagnosis not present

## 2018-08-09 DIAGNOSIS — Z23 Encounter for immunization: Secondary | ICD-10-CM | POA: Diagnosis not present

## 2018-08-10 ENCOUNTER — Other Ambulatory Visit: Payer: Self-pay | Admitting: Family Medicine

## 2018-08-10 DIAGNOSIS — Z8249 Family history of ischemic heart disease and other diseases of the circulatory system: Secondary | ICD-10-CM

## 2018-08-11 ENCOUNTER — Other Ambulatory Visit: Payer: Self-pay | Admitting: Family Medicine

## 2018-08-11 DIAGNOSIS — N6320 Unspecified lump in the left breast, unspecified quadrant: Secondary | ICD-10-CM

## 2018-08-12 ENCOUNTER — Ambulatory Visit
Admission: RE | Admit: 2018-08-12 | Discharge: 2018-08-12 | Disposition: A | Discharge status: Home / Self Care | Payer: BLUE CROSS/BLUE SHIELD | Source: Ambulatory Visit | Attending: Family Medicine | Admitting: Family Medicine

## 2018-08-12 DIAGNOSIS — H538 Other visual disturbances: Secondary | ICD-10-CM | POA: Diagnosis not present

## 2018-08-12 DIAGNOSIS — Z8249 Family history of ischemic heart disease and other diseases of the circulatory system: Secondary | ICD-10-CM

## 2018-08-17 ENCOUNTER — Ambulatory Visit
Admission: RE | Admit: 2018-08-17 | Discharge: 2018-08-17 | Disposition: A | Discharge status: Home / Self Care | Payer: BLUE CROSS/BLUE SHIELD | Source: Ambulatory Visit | Attending: Family Medicine | Admitting: Family Medicine

## 2018-08-17 DIAGNOSIS — N6320 Unspecified lump in the left breast, unspecified quadrant: Secondary | ICD-10-CM

## 2018-08-17 DIAGNOSIS — R922 Inconclusive mammogram: Secondary | ICD-10-CM | POA: Diagnosis not present

## 2018-09-17 ENCOUNTER — Other Ambulatory Visit: Payer: Self-pay | Admitting: Family Medicine

## 2018-09-17 ENCOUNTER — Other Ambulatory Visit (HOSPITAL_COMMUNITY)
Admission: RE | Admit: 2018-09-17 | Discharge: 2018-09-17 | Disposition: A | Discharge status: Home / Self Care | Payer: BLUE CROSS/BLUE SHIELD | Source: Ambulatory Visit | Attending: Family Medicine | Admitting: Family Medicine

## 2018-09-17 DIAGNOSIS — Z0000 Encounter for general adult medical examination without abnormal findings: Secondary | ICD-10-CM | POA: Diagnosis not present

## 2018-09-17 DIAGNOSIS — Z124 Encounter for screening for malignant neoplasm of cervix: Secondary | ICD-10-CM | POA: Diagnosis not present

## 2018-09-17 DIAGNOSIS — Z13220 Encounter for screening for lipoid disorders: Secondary | ICD-10-CM | POA: Diagnosis not present

## 2018-09-17 DIAGNOSIS — I10 Essential (primary) hypertension: Secondary | ICD-10-CM | POA: Diagnosis not present

## 2018-09-17 DIAGNOSIS — Z23 Encounter for immunization: Secondary | ICD-10-CM | POA: Diagnosis not present

## 2018-09-17 DIAGNOSIS — Z113 Encounter for screening for infections with a predominantly sexual mode of transmission: Secondary | ICD-10-CM | POA: Diagnosis not present

## 2018-09-22 LAB — CYTOLOGY - PAP
Adequacy: ABSENT
CHLAMYDIA, DNA PROBE: NEGATIVE
Diagnosis: NEGATIVE
HPV (WINDOPATH): NOT DETECTED
NEISSERIA GONORRHEA: NEGATIVE

## 2018-09-23 DIAGNOSIS — Z8619 Personal history of other infectious and parasitic diseases: Secondary | ICD-10-CM | POA: Diagnosis not present

## 2018-09-23 DIAGNOSIS — Z113 Encounter for screening for infections with a predominantly sexual mode of transmission: Secondary | ICD-10-CM | POA: Diagnosis not present

## 2018-10-06 DIAGNOSIS — N6452 Nipple discharge: Secondary | ICD-10-CM | POA: Diagnosis not present

## 2018-10-08 ENCOUNTER — Other Ambulatory Visit: Payer: Self-pay | Admitting: General Surgery

## 2018-10-08 DIAGNOSIS — N6452 Nipple discharge: Secondary | ICD-10-CM

## 2018-10-16 ENCOUNTER — Ambulatory Visit
Admission: RE | Admit: 2018-10-16 | Discharge: 2018-10-16 | Disposition: A | Discharge status: Home / Self Care | Payer: BLUE CROSS/BLUE SHIELD | Source: Ambulatory Visit | Attending: General Surgery | Admitting: General Surgery

## 2018-10-16 DIAGNOSIS — N6452 Nipple discharge: Secondary | ICD-10-CM

## 2018-10-16 DIAGNOSIS — N6022 Fibroadenosis of left breast: Secondary | ICD-10-CM | POA: Diagnosis not present

## 2018-10-16 MED ORDER — GADOBUTROL 1 MMOL/ML IV SOLN
10.0000 mL | Freq: Once | INTRAVENOUS | Status: AC | PRN
  Administered 2018-10-16: 10 mL via INTRAVENOUS

## 2018-10-22 DIAGNOSIS — Z113 Encounter for screening for infections with a predominantly sexual mode of transmission: Secondary | ICD-10-CM | POA: Diagnosis not present

## 2018-10-26 DIAGNOSIS — E669 Obesity, unspecified: Secondary | ICD-10-CM | POA: Diagnosis not present

## 2018-10-26 DIAGNOSIS — N6452 Nipple discharge: Secondary | ICD-10-CM | POA: Diagnosis not present

## 2018-10-26 DIAGNOSIS — E876 Hypokalemia: Secondary | ICD-10-CM | POA: Diagnosis not present

## 2018-10-26 DIAGNOSIS — I10 Essential (primary) hypertension: Secondary | ICD-10-CM | POA: Diagnosis not present

## 2018-10-27 ENCOUNTER — Other Ambulatory Visit: Payer: Self-pay | Admitting: General Surgery

## 2018-10-27 DIAGNOSIS — N6452 Nipple discharge: Secondary | ICD-10-CM

## 2018-11-02 ENCOUNTER — Ambulatory Visit: Payer: Self-pay | Admitting: General Surgery

## 2018-11-02 ENCOUNTER — Ambulatory Visit
Admission: RE | Admit: 2018-11-02 | Discharge: 2018-11-02 | Disposition: A | Discharge status: Home / Self Care | Payer: BLUE CROSS/BLUE SHIELD | Source: Ambulatory Visit | Attending: General Surgery | Admitting: General Surgery

## 2018-11-02 DIAGNOSIS — N6452 Nipple discharge: Secondary | ICD-10-CM

## 2018-11-02 DIAGNOSIS — N6320 Unspecified lump in the left breast, unspecified quadrant: Secondary | ICD-10-CM

## 2018-11-03 ENCOUNTER — Other Ambulatory Visit: Payer: Self-pay | Admitting: General Surgery

## 2018-11-03 DIAGNOSIS — N6320 Unspecified lump in the left breast, unspecified quadrant: Secondary | ICD-10-CM

## 2018-11-04 ENCOUNTER — Other Ambulatory Visit: Payer: Self-pay | Admitting: General Surgery

## 2018-11-04 DIAGNOSIS — R9389 Abnormal findings on diagnostic imaging of other specified body structures: Secondary | ICD-10-CM

## 2018-12-03 ENCOUNTER — Other Ambulatory Visit: Payer: BLUE CROSS/BLUE SHIELD

## 2018-12-06 ENCOUNTER — Ambulatory Visit (HOSPITAL_BASED_OUTPATIENT_CLINIC_OR_DEPARTMENT_OTHER): Admit: 2018-12-06 | Payer: BLUE CROSS/BLUE SHIELD | Admitting: General Surgery

## 2018-12-06 ENCOUNTER — Encounter (HOSPITAL_BASED_OUTPATIENT_CLINIC_OR_DEPARTMENT_OTHER): Payer: Self-pay

## 2018-12-06 SURGERY — BREAST LUMPECTOMY WITH RADIOACTIVE SEED LOCALIZATION
Anesthesia: General | Site: Breast | Laterality: Left

## 2019-01-04 DIAGNOSIS — E669 Obesity, unspecified: Secondary | ICD-10-CM | POA: Diagnosis not present

## 2019-01-04 DIAGNOSIS — R928 Other abnormal and inconclusive findings on diagnostic imaging of breast: Secondary | ICD-10-CM | POA: Diagnosis not present

## 2019-01-04 DIAGNOSIS — I10 Essential (primary) hypertension: Secondary | ICD-10-CM | POA: Diagnosis not present

## 2019-01-12 DIAGNOSIS — N6452 Nipple discharge: Secondary | ICD-10-CM | POA: Diagnosis not present

## 2019-01-12 DIAGNOSIS — R928 Other abnormal and inconclusive findings on diagnostic imaging of breast: Secondary | ICD-10-CM | POA: Diagnosis not present

## 2019-01-13 DIAGNOSIS — N6452 Nipple discharge: Secondary | ICD-10-CM | POA: Insufficient documentation

## 2019-01-13 DIAGNOSIS — R928 Other abnormal and inconclusive findings on diagnostic imaging of breast: Secondary | ICD-10-CM | POA: Insufficient documentation

## 2019-02-11 DIAGNOSIS — M25561 Pain in right knee: Secondary | ICD-10-CM | POA: Diagnosis not present

## 2019-02-11 DIAGNOSIS — R509 Fever, unspecified: Secondary | ICD-10-CM | POA: Diagnosis not present

## 2019-02-17 DIAGNOSIS — M25561 Pain in right knee: Secondary | ICD-10-CM | POA: Diagnosis not present

## 2019-02-17 DIAGNOSIS — M1711 Unilateral primary osteoarthritis, right knee: Secondary | ICD-10-CM | POA: Diagnosis not present

## 2019-02-22 DIAGNOSIS — I10 Essential (primary) hypertension: Secondary | ICD-10-CM | POA: Diagnosis not present

## 2019-02-22 DIAGNOSIS — G479 Sleep disorder, unspecified: Secondary | ICD-10-CM | POA: Diagnosis not present

## 2019-02-22 DIAGNOSIS — E669 Obesity, unspecified: Secondary | ICD-10-CM | POA: Diagnosis not present

## 2019-02-25 DIAGNOSIS — Z1211 Encounter for screening for malignant neoplasm of colon: Secondary | ICD-10-CM | POA: Diagnosis not present

## 2019-03-24 DIAGNOSIS — M25561 Pain in right knee: Secondary | ICD-10-CM | POA: Diagnosis not present

## 2019-03-24 DIAGNOSIS — M1711 Unilateral primary osteoarthritis, right knee: Secondary | ICD-10-CM | POA: Diagnosis not present

## 2019-03-24 DIAGNOSIS — M1712 Unilateral primary osteoarthritis, left knee: Secondary | ICD-10-CM | POA: Diagnosis not present

## 2019-03-24 DIAGNOSIS — M25562 Pain in left knee: Secondary | ICD-10-CM | POA: Diagnosis not present

## 2019-04-25 DIAGNOSIS — J358 Other chronic diseases of tonsils and adenoids: Secondary | ICD-10-CM

## 2019-04-25 DIAGNOSIS — Z7289 Other problems related to lifestyle: Secondary | ICD-10-CM | POA: Diagnosis not present

## 2019-04-25 DIAGNOSIS — Z87891 Personal history of nicotine dependence: Secondary | ICD-10-CM | POA: Diagnosis not present

## 2019-04-25 DIAGNOSIS — J343 Hypertrophy of nasal turbinates: Secondary | ICD-10-CM | POA: Diagnosis not present

## 2019-04-25 HISTORY — DX: Other chronic diseases of tonsils and adenoids: J35.8

## 2019-07-21 DIAGNOSIS — M1711 Unilateral primary osteoarthritis, right knee: Secondary | ICD-10-CM | POA: Diagnosis not present

## 2019-07-21 DIAGNOSIS — M25561 Pain in right knee: Secondary | ICD-10-CM | POA: Diagnosis not present

## 2019-07-21 DIAGNOSIS — M25562 Pain in left knee: Secondary | ICD-10-CM | POA: Diagnosis not present

## 2019-07-21 DIAGNOSIS — M1712 Unilateral primary osteoarthritis, left knee: Secondary | ICD-10-CM | POA: Diagnosis not present

## 2019-09-20 DIAGNOSIS — Z113 Encounter for screening for infections with a predominantly sexual mode of transmission: Secondary | ICD-10-CM | POA: Diagnosis not present

## 2019-09-20 DIAGNOSIS — Z1159 Encounter for screening for other viral diseases: Secondary | ICD-10-CM | POA: Diagnosis not present

## 2019-09-20 DIAGNOSIS — G479 Sleep disorder, unspecified: Secondary | ICD-10-CM | POA: Diagnosis not present

## 2019-09-20 DIAGNOSIS — E7889 Other lipoprotein metabolism disorders: Secondary | ICD-10-CM | POA: Diagnosis not present

## 2019-09-20 DIAGNOSIS — Z0000 Encounter for general adult medical examination without abnormal findings: Secondary | ICD-10-CM | POA: Diagnosis not present

## 2019-09-20 DIAGNOSIS — I10 Essential (primary) hypertension: Secondary | ICD-10-CM | POA: Diagnosis not present

## 2019-10-11 DIAGNOSIS — M5441 Lumbago with sciatica, right side: Secondary | ICD-10-CM | POA: Diagnosis not present

## 2019-10-11 DIAGNOSIS — M5442 Lumbago with sciatica, left side: Secondary | ICD-10-CM | POA: Diagnosis not present

## 2019-10-14 DIAGNOSIS — M5416 Radiculopathy, lumbar region: Secondary | ICD-10-CM | POA: Diagnosis not present

## 2019-11-10 DIAGNOSIS — M5136 Other intervertebral disc degeneration, lumbar region: Secondary | ICD-10-CM | POA: Diagnosis not present

## 2019-11-24 DIAGNOSIS — M5416 Radiculopathy, lumbar region: Secondary | ICD-10-CM | POA: Insufficient documentation

## 2020-05-08 IMAGING — MR MR BILATERAL BREAST WITHOUT AND WITH CONTRAST
7 of 13 series · 25 of 48 positions shown · IV contrast (gadavist)
Comparison: Digital diagnostic mammogram on 08/17/2018
COMPARISON: Digital diagnostic mammogram on 08/17/2018

Addendum:
CLINICAL DATA: LEFT bloody nipple discharge for 3 days. History of
benign LEFT breast biopsy in 0888, showing fibroadenoma.

LABS:  Creatinine was obtained on site at [HOSPITAL] at [REDACTED] [HOSPITAL].
Results: Creatinine 0.5 mg/dL.
EXAM:
BILATERAL BREAST MRI WITH AND WITHOUT CONTRAST
TECHNIQUE: Multiplanar, multisequence MR images of both breasts were obtained
prior to and following the intravenous administration of 10 ml of
Gadavist

[Series 2: t2_tirm_tra ipat (a-p) · axial · 3.0mm · 0.70mm/px · 1 of 65 slices shown]
[im 1/65]
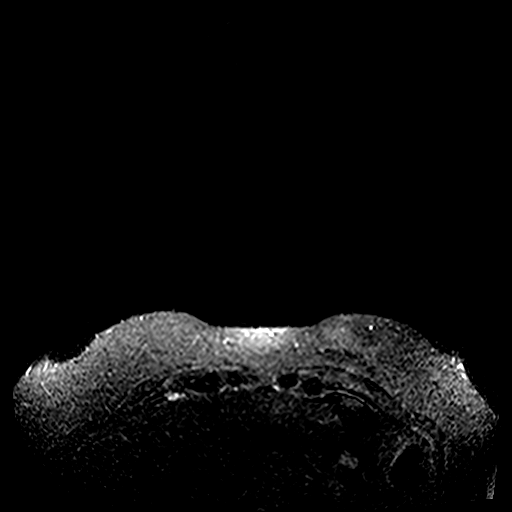

[Series 3: fl3d pre-cm no · axial · non-contrast · 0.9mm · 0.94mm/px · z∈[-84,+103]mm · 5 of 208 slices shown]
[im 1/208]
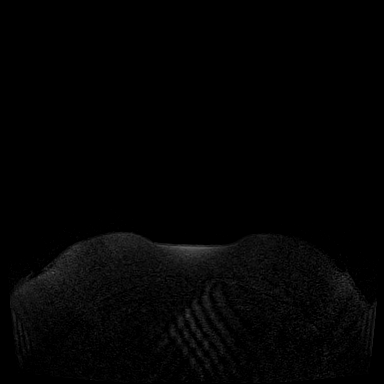
[im 52/208]
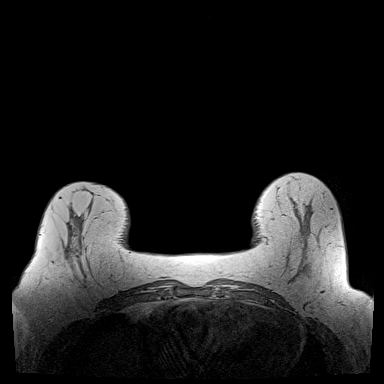
[im 104/208]
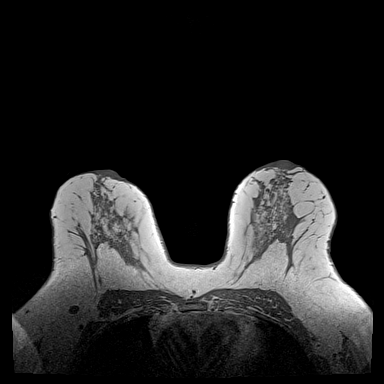
[im 156/208]
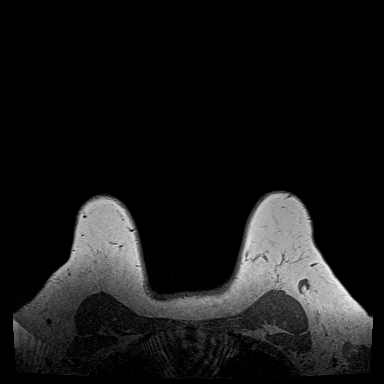
[im 208/208]
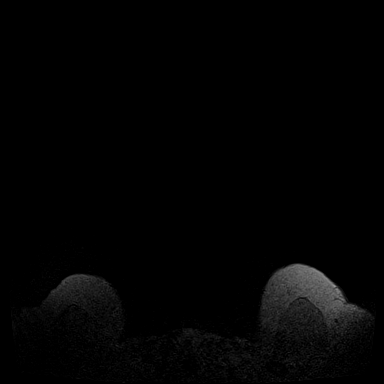

[Series 4: fl3d pre-cm · axial · non-contrast · 0.9mm · 0.87mm/px · z∈[-84,+103]mm · 5 of 208 slices shown]
[im 1/208]
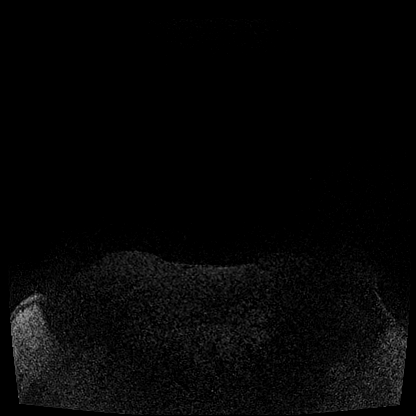
[im 52/208]
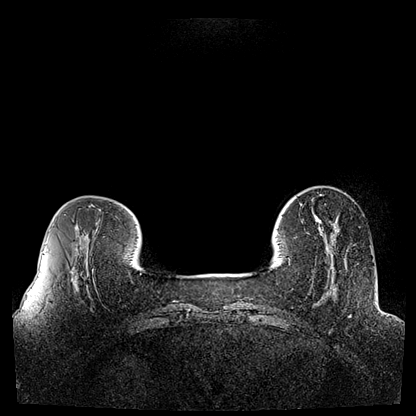
[im 104/208]
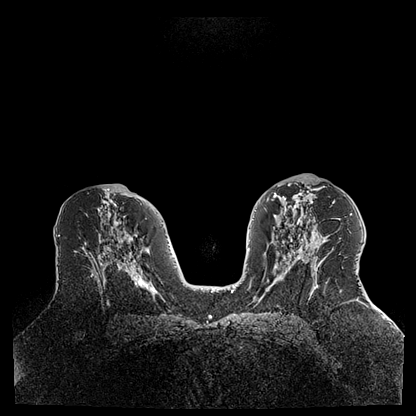
[im 156/208]
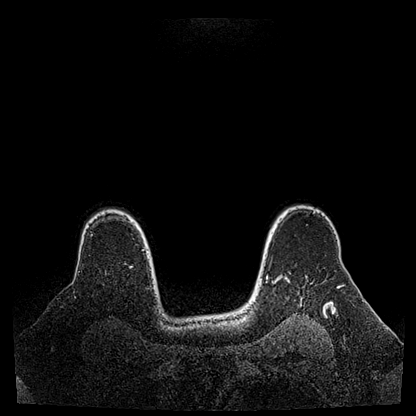
[im 208/208]
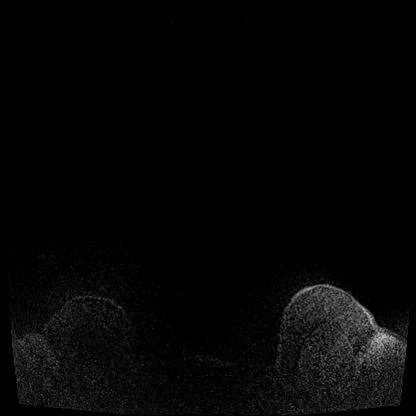

[Series 5: fl3d post-cm 20 · axial · 0.9mm · 0.87mm/px · z∈[-84,+103]mm · 5 of 208 slices shown (1 of 3)]
[im 1/208]
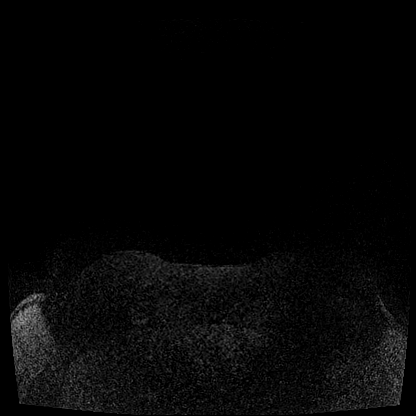
[im 52/208]
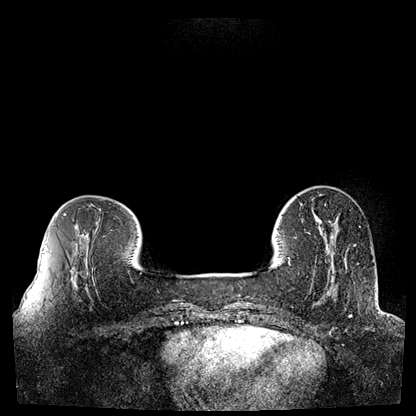
[im 104/208]
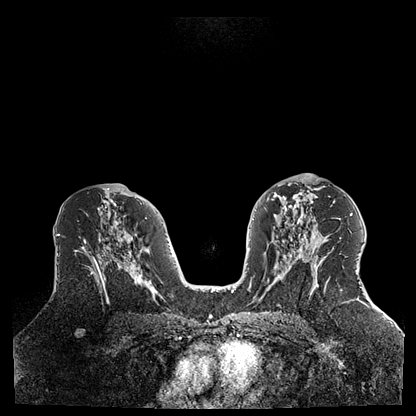
[im 156/208]
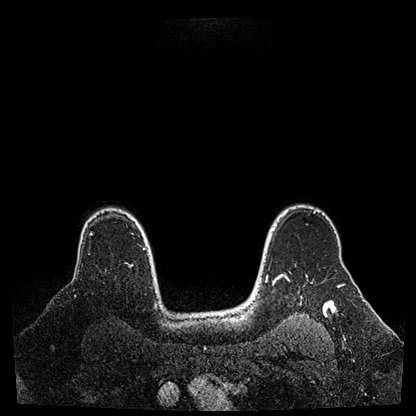
[im 208/208]
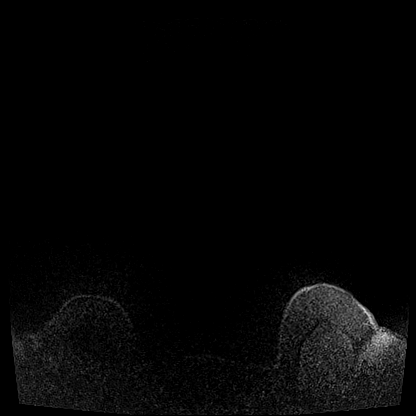

[Series 6: fl3d post-cm 20 · axial · 0.9mm · 0.87mm/px · z∈[-84,+103]mm · 5 of 208 slices shown (2 of 3)]
[im 1/208]
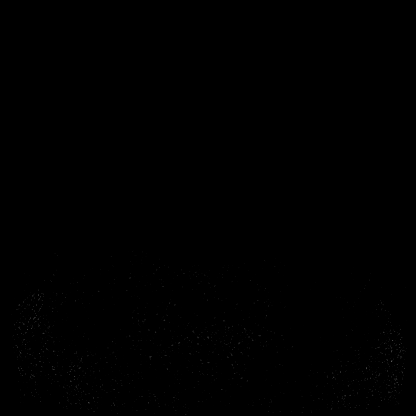
[im 52/208]
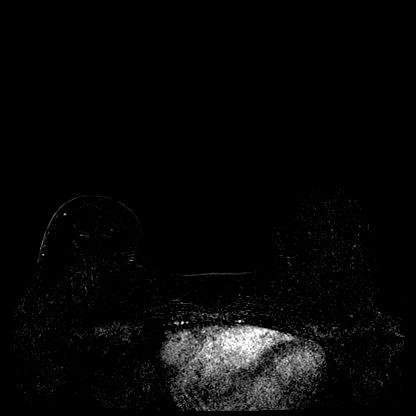
[im 104/208]
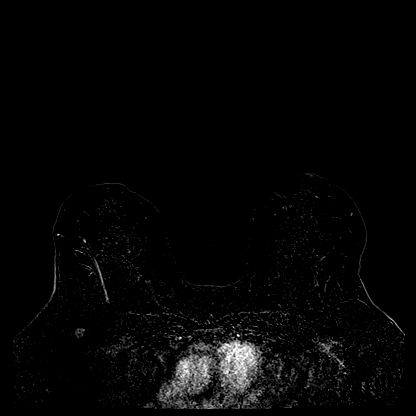
[im 156/208]
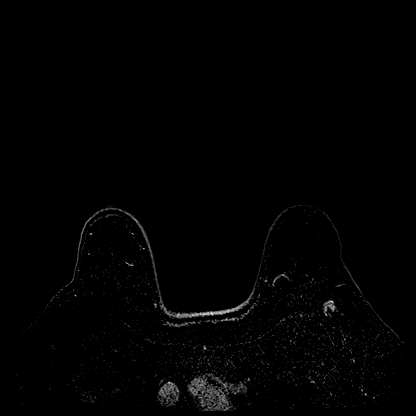
[im 208/208]
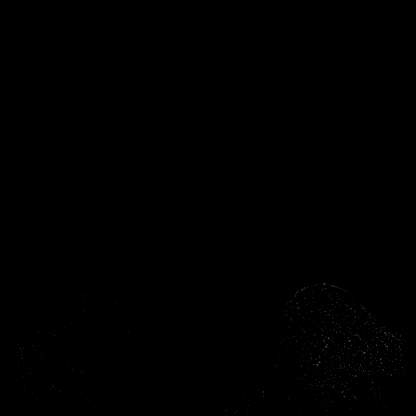

[Series 7: fl3d post-cm 20 · axial · 187.2mm · 0.87mm/px · 1 of 1 slices shown (3 of 3)]
[im 1/1]
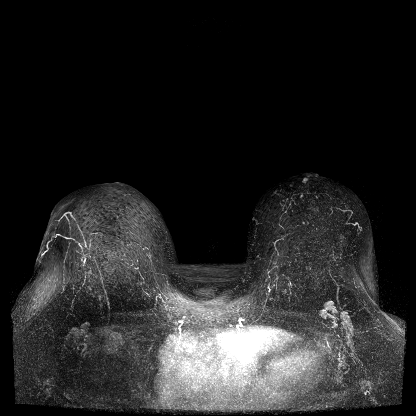

[Series 8: fl3d post-cm 3min · axial · 0.9mm · 0.87mm/px · z∈[-84,+9]mm · 3 of 208 slices shown]
[im 1/208]
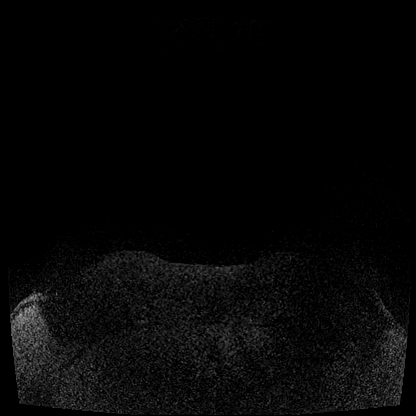
[im 52/208]
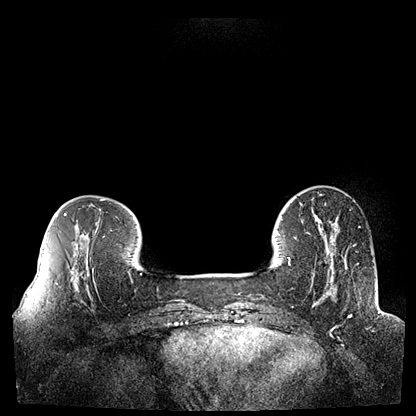
[im 104/208]
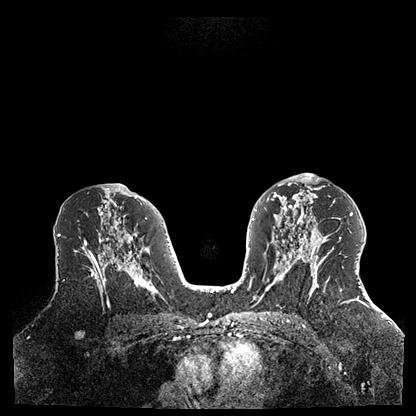

[25 of 48 positions shown; findings below may reference images not displayed]

Three-dimensional MR images were rendered by post-processing of the
original MR data on an independent workstation. The
three-dimensional MR images were interpreted, and findings are
reported in the following complete MRI report for this study. Three
dimensional images were evaluated at the independent DynaCad
workstation
FINDINGS: Breast composition: c. Heterogeneous fibroglandular tissue.

Background parenchymal enhancement: Minimal

Right breast: No mass or abnormal enhancement.

Left breast: Within the MEDIAL aspect of the LEFT nipple areolar
complex, there is an enhancing tubular mass measuring 1.1 x
centimeters and demonstrating persistent type enhancement kinetics.
Given its location and appearance, this could represent an
intraductal papilloma. A nonenhancing 1.6 centimeter retroareolar
mass in the LEFT breast is in the 8 o'clock location, contains clip
artifact, and is consistent with benign fibroadenoma. The enhancing
mass is just superior and LATERAL to this known fibroadenoma.

Lymph nodes: No abnormal appearing lymph nodes.

Ancillary findings:  None.
IMPRESSION: Suspicious enhancing mass in the MEDIAL nipple-areolar complex of
the LEFT breast, just superior and LATERAL to the known fibroadenoma
in the retroareolar region. Recommend tissue diagnosis. Given the
location of this abnormality in the LEFT nipple, MR guided core
biopsy may be contraindicated.

RECOMMENDATION:
1. First I would recommend targeted ultrasound of the LEFT nipple
areolar complex to determine if there is a sonographic correlate for
the MR finding. If so, ultrasound-guided core biopsy is recommended.
2. If there is no sonographic correlate for the MRI abnormality, I
would recommend surgical consultation to discuss excision of the
discharging duct. MR guided clip placement and seed localization
could be performed prior to surgery as needed.

BI-RADS CATEGORY  4: Suspicious.

ADDENDUM:
I discussed this patient with Dr. Boswell. Ultrasound exam today failed
to demonstrate a sonographic correlate for the MRI abnormality in
the LEFT breast.

Given the close proximity of this lesion to the nipple, MR guided
core biopsy is contraindicated. However MR guided clip placement is
likely possible. I would recommend use of a HydroMARK clip so that
ultrasound guided seed placement can be performed prior to excision.

*** End of Addendum ***
Addendum:
Three-dimensional MR images were rendered by post-processing of the
original MR data on an independent workstation. The
three-dimensional MR images were interpreted, and findings are
reported in the following complete MRI report for this study. Three
dimensional images were evaluated at the independent DynaCad
workstation
FINDINGS: Breast composition: c. Heterogeneous fibroglandular tissue.

Background parenchymal enhancement: Minimal

Right breast: No mass or abnormal enhancement.

Left breast: Within the MEDIAL aspect of the LEFT nipple areolar
complex, there is an enhancing tubular mass measuring 1.1 x
centimeters and demonstrating persistent type enhancement kinetics.
Given its location and appearance, this could represent an
intraductal papilloma. A nonenhancing 1.6 centimeter retroareolar
mass in the LEFT breast is in the 8 o'clock location, contains clip
artifact, and is consistent with benign fibroadenoma. The enhancing
mass is just superior and LATERAL to this known fibroadenoma.

Lymph nodes: No abnormal appearing lymph nodes.

Ancillary findings:  None.
IMPRESSION: Suspicious enhancing mass in the MEDIAL nipple-areolar complex of
the LEFT breast, just superior and LATERAL to the known fibroadenoma
in the retroareolar region. Recommend tissue diagnosis. Given the
location of this abnormality in the LEFT nipple, MR guided core
biopsy may be contraindicated.

RECOMMENDATION:
1. First I would recommend targeted ultrasound of the LEFT nipple
areolar complex to determine if there is a sonographic correlate for
the MR finding. If so, ultrasound-guided core biopsy is recommended.
2. If there is no sonographic correlate for the MRI abnormality, I
would recommend surgical consultation to discuss excision of the
discharging duct. MR guided clip placement and seed localization
could be performed prior to surgery as needed.

BI-RADS CATEGORY  4: Suspicious.

ADDENDUM:
I discussed this patient with Dr. Boswell. Ultrasound exam today failed
to demonstrate a sonographic correlate for the MRI abnormality in
the LEFT breast.

Given the close proximity of this lesion to the nipple, MR guided
core biopsy is contraindicated. However MR guided clip placement is
likely possible. I would recommend use of a HydroMARK clip so that
ultrasound guided seed placement can be performed prior to excision.

*** End of Addendum ***

## 2020-07-06 DIAGNOSIS — E559 Vitamin D deficiency, unspecified: Secondary | ICD-10-CM | POA: Diagnosis not present

## 2020-07-06 DIAGNOSIS — I10 Essential (primary) hypertension: Secondary | ICD-10-CM | POA: Diagnosis not present

## 2020-07-06 DIAGNOSIS — R202 Paresthesia of skin: Secondary | ICD-10-CM | POA: Diagnosis not present

## 2020-07-06 DIAGNOSIS — M8949 Other hypertrophic osteoarthropathy, multiple sites: Secondary | ICD-10-CM | POA: Diagnosis not present

## 2020-07-06 DIAGNOSIS — F5104 Psychophysiologic insomnia: Secondary | ICD-10-CM | POA: Diagnosis not present

## 2020-07-06 DIAGNOSIS — M25511 Pain in right shoulder: Secondary | ICD-10-CM | POA: Diagnosis not present

## 2020-07-24 DIAGNOSIS — M7541 Impingement syndrome of right shoulder: Secondary | ICD-10-CM | POA: Diagnosis not present

## 2020-09-07 DIAGNOSIS — B349 Viral infection, unspecified: Secondary | ICD-10-CM | POA: Diagnosis not present

## 2020-09-07 DIAGNOSIS — Z20822 Contact with and (suspected) exposure to covid-19: Secondary | ICD-10-CM | POA: Diagnosis not present

## 2020-09-13 DIAGNOSIS — M6289 Other specified disorders of muscle: Secondary | ICD-10-CM | POA: Diagnosis not present

## 2020-09-13 DIAGNOSIS — J019 Acute sinusitis, unspecified: Secondary | ICD-10-CM | POA: Diagnosis not present

## 2020-09-13 DIAGNOSIS — I10 Essential (primary) hypertension: Secondary | ICD-10-CM | POA: Diagnosis not present

## 2020-09-13 DIAGNOSIS — F419 Anxiety disorder, unspecified: Secondary | ICD-10-CM | POA: Diagnosis not present

## 2020-10-09 DIAGNOSIS — Z13220 Encounter for screening for lipoid disorders: Secondary | ICD-10-CM | POA: Diagnosis not present

## 2020-10-09 DIAGNOSIS — M5442 Lumbago with sciatica, left side: Secondary | ICD-10-CM | POA: Diagnosis not present

## 2020-10-09 DIAGNOSIS — Z113 Encounter for screening for infections with a predominantly sexual mode of transmission: Secondary | ICD-10-CM | POA: Diagnosis not present

## 2020-10-09 DIAGNOSIS — G479 Sleep disorder, unspecified: Secondary | ICD-10-CM | POA: Diagnosis not present

## 2020-10-09 DIAGNOSIS — I10 Essential (primary) hypertension: Secondary | ICD-10-CM | POA: Diagnosis not present

## 2020-10-09 DIAGNOSIS — H6123 Impacted cerumen, bilateral: Secondary | ICD-10-CM | POA: Diagnosis not present

## 2020-10-09 DIAGNOSIS — Z0000 Encounter for general adult medical examination without abnormal findings: Secondary | ICD-10-CM | POA: Diagnosis not present

## 2020-10-09 DIAGNOSIS — E559 Vitamin D deficiency, unspecified: Secondary | ICD-10-CM | POA: Diagnosis not present

## 2020-10-09 DIAGNOSIS — Z23 Encounter for immunization: Secondary | ICD-10-CM | POA: Diagnosis not present

## 2020-10-10 ENCOUNTER — Other Ambulatory Visit: Payer: Self-pay | Admitting: Family Medicine

## 2020-10-10 DIAGNOSIS — Z1231 Encounter for screening mammogram for malignant neoplasm of breast: Secondary | ICD-10-CM

## 2020-10-10 DIAGNOSIS — Z1211 Encounter for screening for malignant neoplasm of colon: Secondary | ICD-10-CM | POA: Diagnosis not present

## 2020-10-18 DIAGNOSIS — M5030 Other cervical disc degeneration, unspecified cervical region: Secondary | ICD-10-CM | POA: Diagnosis not present

## 2020-10-18 DIAGNOSIS — M542 Cervicalgia: Secondary | ICD-10-CM | POA: Diagnosis not present

## 2020-10-18 DIAGNOSIS — M25511 Pain in right shoulder: Secondary | ICD-10-CM | POA: Diagnosis not present

## 2021-03-07 ENCOUNTER — Emergency Department (HOSPITAL_BASED_OUTPATIENT_CLINIC_OR_DEPARTMENT_OTHER)
Admission: EM | Admit: 2021-03-07 | Discharge: 2021-03-08 | Disposition: A | Discharge status: Home / Self Care | Payer: Self-pay | Attending: Emergency Medicine | Admitting: Emergency Medicine

## 2021-03-07 ENCOUNTER — Other Ambulatory Visit: Payer: Self-pay

## 2021-03-07 ENCOUNTER — Encounter (HOSPITAL_BASED_OUTPATIENT_CLINIC_OR_DEPARTMENT_OTHER): Payer: Self-pay

## 2021-03-07 DIAGNOSIS — Z79899 Other long term (current) drug therapy: Secondary | ICD-10-CM | POA: Insufficient documentation

## 2021-03-07 DIAGNOSIS — J45909 Unspecified asthma, uncomplicated: Secondary | ICD-10-CM | POA: Insufficient documentation

## 2021-03-07 DIAGNOSIS — M25562 Pain in left knee: Secondary | ICD-10-CM | POA: Insufficient documentation

## 2021-03-07 DIAGNOSIS — Z87891 Personal history of nicotine dependence: Secondary | ICD-10-CM | POA: Insufficient documentation

## 2021-03-07 DIAGNOSIS — I10 Essential (primary) hypertension: Secondary | ICD-10-CM | POA: Insufficient documentation

## 2021-03-07 NOTE — ED Notes (Signed)
MD Fulton Reek made aware of Patient; [place] not available at this time but MD would like patient assessed prior to having Imaging ordered.

## 2021-03-07 NOTE — ED Triage Notes (Signed)
Patient here POV from Home with Leg Pain.  Patient was on the Treadmill last week and without Traumatic Injury, pain began in the Medial Side of Left Knee and radiates down to Ankle. Pain is Constant  OTC Medication and RICE Therapy has been ineffective.  BIB Wheelchair. Ambulatory, GCS 15.

## 2021-03-08 ENCOUNTER — Other Ambulatory Visit (HOSPITAL_BASED_OUTPATIENT_CLINIC_OR_DEPARTMENT_OTHER): Payer: Self-pay | Admitting: Emergency Medicine

## 2021-03-08 ENCOUNTER — Emergency Department (HOSPITAL_BASED_OUTPATIENT_CLINIC_OR_DEPARTMENT_OTHER): Payer: Self-pay

## 2021-03-08 ENCOUNTER — Ambulatory Visit (HOSPITAL_BASED_OUTPATIENT_CLINIC_OR_DEPARTMENT_OTHER)
Admission: RE | Admit: 2021-03-08 | Discharge: 2021-03-08 | Disposition: A | Discharge status: Home / Self Care | Payer: Self-pay | Source: Ambulatory Visit | Attending: Emergency Medicine | Admitting: Emergency Medicine

## 2021-03-08 DIAGNOSIS — R52 Pain, unspecified: Secondary | ICD-10-CM

## 2021-03-08 MED ORDER — IBUPROFEN 400 MG PO TABS
600.0000 mg | ORAL_TABLET | Freq: Once | ORAL | Status: AC
  Administered 2021-03-08: 600 mg via ORAL
  Filled 2021-03-08: qty 1

## 2021-03-08 MED ORDER — ACETAMINOPHEN 325 MG PO TABS
650.0000 mg | ORAL_TABLET | Freq: Once | ORAL | Status: AC
  Administered 2021-03-08: 650 mg via ORAL
  Filled 2021-03-08: qty 2

## 2021-03-08 NOTE — ED Notes (Signed)
EMT Amy demonstrated propper use of crutches and wrapped knee with ace wrap for the knee immobilizer would not fit per doctor's orders

## 2021-03-08 NOTE — Discharge Instructions (Signed)
Return tomorrow for an 11 AM ultrasound of your left lower extremity to rule out a blood clot.  Return immediately if you have difficulty breathing worsening symptoms fevers or any additional concerns.

## 2021-03-08 NOTE — ED Provider Notes (Addendum)
[facility] EMERGENCY DEPT Provider Note   CSN: [id number] Arrival date & time: 03/07/21  1910     History Chief Complaint  Patient presents with   Leg Pain    Left    Ashley Davis is a 55 y.o. female.  Patient presents chief complaint of left lower extremity knee region.  Ongoing for the past 10 days.  She states that it all started when she began a new treadmill program.  She noticed some discomfort to the left knee which continued throughout the last couple of days and now has been more severe over the last 2 to 3 days.  She describes a sharp pain in the left knee lateral aspect.  Denies any associated shortness of breath.  No fever no cough no vomiting no diarrhea.  She was at work today when she felt the pain was too severe and so came into the ER for further evaluation.      Past Medical History:  Diagnosis Date   Asthma    Depression    History of bronchitis    Hypertension    Pt states her HTN is due to her migraines   Migraine    Stroke [facility]) 2015    Patient Active Problem List   Diagnosis Date Noted   Fibroids 62/69/4854   Complicated migraine 62/70/3500   Asthma, chronic 04/27/2014   Hemiplegia, unspecified, affecting nondominant side 04/26/2014   Weakness 04/24/2014    Past Surgical History:  Procedure Laterality Date   BREAST BIOPSY     IR ANGIOGRAM PELVIS SELECTIVE OR SUPRASELECTIVE  04/07/2017   IR ANGIOGRAM PELVIS SELECTIVE OR SUPRASELECTIVE  04/07/2017   IR ANGIOGRAM SELECTIVE EACH ADDITIONAL VESSEL  04/07/2017   IR ANGIOGRAM SELECTIVE EACH ADDITIONAL VESSEL  04/07/2017   IR ANGIOGRAM VISCERAL SELECTIVE  04/07/2017   IR EMBO TUMOR ORGAN ISCHEMIA INFARCT INC GUIDE ROADMAPPING  04/07/2017   IR RADIOLOGIST EVAL & MGMT  01/01/2017   IR RADIOLOGIST EVAL & MGMT  04/30/2017   IR US GUIDE VASC ACCESS RIGHT  04/07/2017   NO PAST SURGERIES       OB History   No obstetric history on file.     Family History  Problem Relation Age of Onset    Diabetes Mother    Stroke Mother    Cancer Mother    Headache Mother    Diabetes Father    Other Father        neuropathy   Headache Sister    Aneurysm Sister        3 brain surgeries    Social History   Tobacco Use   Smoking status: Former    Packs/day: 0.25    Years: 30.00    Pack years: 7.50    Types: Cigarettes    Quit date: 04/06/2017    Years since quitting: 3.9   Smokeless tobacco: Never  Vaping Use   Vaping Use: Never used  Substance Use Topics   Alcohol use: Yes    Comment: wine 2-3 x month   Drug use: No    Home Medications Prior to Admission medications   Medication Sig Start Date End Date Taking? Authorizing Provider  aspirin-acetaminophen-caffeine (EXCEDRIN MIGRAINE) [phone] MG tablet Take 1-3 tablets by mouth every 6 (six) hours as needed for headache or migraine.    [provider]  diphenhydrAMINE (BENADRYL) 25 MG tablet Take 50 mg by mouth daily as needed for allergies.     [provider]  losartan (COZAAR) 50  MG tablet Take 50 mg by mouth daily.    [provider]    Allergies    Augmentin [amoxicillin-pot clavulanate] and Shellfish allergy  Review of Systems   Review of Systems  Constitutional:  Negative for fever.  HENT:  Negative for ear pain.   Eyes:  Negative for pain.  Respiratory:  Negative for cough.   Cardiovascular:  Negative for chest pain.  Gastrointestinal:  Negative for abdominal pain.  Genitourinary:  Negative for flank pain.  Musculoskeletal:  Negative for back pain.  Skin:  Negative for rash.  Neurological:  Negative for headaches.   Physical Exam Updated Vital Signs BP (!) 180/93 (BP Location: Right Arm)   Pulse 87   Temp 98.1 F (36.7 C) (Oral)   Resp 16   Ht 5\' 11"  (1.803 m)   Wt 129.7 kg   LMP 03/31/2017 (Exact Date) Comment: neg preg test 04/07/17  SpO2 97%   BMI 39.88 kg/m   Physical Exam Constitutional:      General: She is not in acute distress.    Appearance: Normal  appearance.  HENT:     Head: Normocephalic.     Nose: Nose normal.  Eyes:     Extraocular Movements: Extraocular movements intact.  Cardiovascular:     Rate and Rhythm: Normal rate.  Pulmonary:     Effort: Pulmonary effort is normal.  Musculoskeletal:     Cervical back: Normal range of motion.     Comments: Tenderness palpation the left knee anterior aspect medial aspect.  No abnormal warmth noted.  Range of motion mildly decreased secondary to pain.  Neurological:     General: No focal deficit present.     Mental Status: She is alert. Mental status is at baseline.    ED Results / Procedures / Treatments   Labs (all labs ordered are listed, but only abnormal results are displayed) Labs Reviewed - No data to display  EKG None  Radiology No results found.  Procedures .Ortho Injury Treatment  Date/Time: 03/08/2021 12:46 AM Performed by: Luna Fuse, MD Authorized by: Luna Fuse, MD  Post-procedure neurovascular assessment: post-procedure neurovascularly intact Comments: Patient extremity not suitable for knee immobilizer given size.  Ace wrap applied.     Medications Ordered in ED Medications  acetaminophen (TYLENOL) tablet 650 mg (650 mg Oral Given 03/08/21 0021)  ibuprofen (ADVIL) tablet 600 mg (600 mg Oral Given 03/08/21 0021)    ED Course  I have reviewed the triage vital signs and the nursing notes.  Pertinent labs & imaging results that were available during my care of the patient were reviewed by me and considered in my medical decision making (see chart for details).    MDM Rules/Calculators/A&P                          Patient declined x-rays as I explained to them given her history I doubt acute fracture.  I did offer to order x-rays to expedite her outpatient work-up but she declined.  Ultrasound was ordered for tomorrow morning as the technician had already left.  Given her history, I doubt thromboembolism as the primary cause of her symptoms and do  not think the risks outweigh the benefits of empirically starting anticoagulation at this time prior to ultrasound.  Understanding to return tomorrow for ultrasound.   Impression(s) / ED Diagnoses Final diagnoses:  Acute pain of left knee    Rx / DC Orders ED Discharge Orders  None        Luna Fuse, MD 03/08/21 9179    Luna Fuse, MD 03/08/21 [phone]

## 2021-03-08 NOTE — ED Provider Notes (Signed)
Return for Doppler US. Negative for DVT. Advised NSAID, ACE wrap and PCP follow up.    Truddie Hidden, MD 03/08/21 [phone]

## 2021-03-15 ENCOUNTER — Other Ambulatory Visit (HOSPITAL_BASED_OUTPATIENT_CLINIC_OR_DEPARTMENT_OTHER): Payer: Self-pay

## 2021-03-15 ENCOUNTER — Ambulatory Visit (HOSPITAL_BASED_OUTPATIENT_CLINIC_OR_DEPARTMENT_OTHER): Payer: Self-pay

## 2021-04-09 ENCOUNTER — Ambulatory Visit (HOSPITAL_BASED_OUTPATIENT_CLINIC_OR_DEPARTMENT_OTHER): Payer: Self-pay

## 2021-06-20 DIAGNOSIS — U071 COVID-19: Secondary | ICD-10-CM | POA: Diagnosis not present

## 2021-06-23 ENCOUNTER — Other Ambulatory Visit: Payer: Self-pay | Admitting: Nurse Practitioner

## 2021-06-23 DIAGNOSIS — F32A Depression, unspecified: Secondary | ICD-10-CM | POA: Insufficient documentation

## 2021-06-23 DIAGNOSIS — M1990 Unspecified osteoarthritis, unspecified site: Secondary | ICD-10-CM | POA: Insufficient documentation

## 2021-06-23 DIAGNOSIS — E669 Obesity, unspecified: Secondary | ICD-10-CM | POA: Insufficient documentation

## 2021-06-23 DIAGNOSIS — M549 Dorsalgia, unspecified: Secondary | ICD-10-CM | POA: Insufficient documentation

## 2021-06-23 DIAGNOSIS — F419 Anxiety disorder, unspecified: Secondary | ICD-10-CM | POA: Insufficient documentation

## 2021-06-23 DIAGNOSIS — K219 Gastro-esophageal reflux disease without esophagitis: Secondary | ICD-10-CM | POA: Insufficient documentation

## 2021-06-23 DIAGNOSIS — R519 Headache, unspecified: Secondary | ICD-10-CM | POA: Insufficient documentation

## 2021-06-23 DIAGNOSIS — G43909 Migraine, unspecified, not intractable, without status migrainosus: Secondary | ICD-10-CM | POA: Insufficient documentation

## 2021-06-23 DIAGNOSIS — I10 Essential (primary) hypertension: Secondary | ICD-10-CM | POA: Insufficient documentation

## 2021-06-23 MED ORDER — PREDNISONE 20 MG PO TABS: 20.0000 mg | ORAL_TABLET | Freq: Every day | ORAL | 0 refills | Status: AC

## 2021-06-23 MED ORDER — FLUTICASONE PROPIONATE 50 MCG/ACT NA SUSP: 2.0000 | Freq: Every day | NASAL | 6 refills | Status: DC

## 2021-06-25 DIAGNOSIS — U071 COVID-19: Secondary | ICD-10-CM | POA: Diagnosis not present

## 2021-06-25 DIAGNOSIS — J209 Acute bronchitis, unspecified: Secondary | ICD-10-CM | POA: Diagnosis not present

## 2021-07-01 ENCOUNTER — Other Ambulatory Visit (HOSPITAL_COMMUNITY): Payer: Self-pay

## 2021-07-01 MED ORDER — MELOXICAM 15 MG PO TABS
15.0000 mg | ORAL_TABLET | Freq: Every day | ORAL | 0 refills | Status: DC
  Filled 2021-07-01: qty 60, 60d supply, fill #0

## 2021-07-01 MED ORDER — TRAZODONE HCL 50 MG PO TABS
50.0000 mg | ORAL_TABLET | Freq: Every evening | ORAL | 0 refills | Status: DC | PRN
  Filled 2021-07-01: qty 120, 60d supply, fill #0

## 2021-07-01 MED ORDER — OLMESARTAN MEDOXOMIL-HCTZ 40-12.5 MG PO TABS
ORAL_TABLET | ORAL | 0 refills | Status: DC
  Filled 2021-07-01: qty 60, 60d supply, fill #0

## 2021-07-02 ENCOUNTER — Other Ambulatory Visit (HOSPITAL_COMMUNITY): Payer: Self-pay

## 2021-07-09 DIAGNOSIS — U099 Post covid-19 condition, unspecified: Secondary | ICD-10-CM | POA: Diagnosis not present

## 2021-07-09 DIAGNOSIS — I10 Essential (primary) hypertension: Secondary | ICD-10-CM | POA: Diagnosis not present

## 2021-07-09 DIAGNOSIS — E559 Vitamin D deficiency, unspecified: Secondary | ICD-10-CM | POA: Diagnosis not present

## 2021-10-18 ENCOUNTER — Other Ambulatory Visit (HOSPITAL_COMMUNITY): Payer: Self-pay

## 2021-10-18 MED ORDER — OLMESARTAN MEDOXOMIL-HCTZ 40-12.5 MG PO TABS
ORAL_TABLET | ORAL | 0 refills | Status: DC
  Filled 2021-10-18: qty 90, 90d supply, fill #0

## 2021-10-18 MED ORDER — LORATADINE 10 MG PO TABS
10.0000 mg | ORAL_TABLET | Freq: Every day | ORAL | 0 refills | Status: AC
  Filled 2021-10-18: qty 90, 90d supply, fill #0

## 2021-10-18 MED ORDER — MELOXICAM 15 MG PO TABS
15.0000 mg | ORAL_TABLET | Freq: Every day | ORAL | 0 refills | Status: DC
  Filled 2021-10-18: qty 90, 90d supply, fill #0

## 2022-02-04 ENCOUNTER — Other Ambulatory Visit (HOSPITAL_COMMUNITY): Payer: Self-pay

## 2022-02-04 DIAGNOSIS — R0981 Nasal congestion: Secondary | ICD-10-CM | POA: Insufficient documentation

## 2022-02-04 DIAGNOSIS — J309 Allergic rhinitis, unspecified: Secondary | ICD-10-CM | POA: Insufficient documentation

## 2022-02-04 MED ORDER — FLUTICASONE PROPIONATE 50 MCG/ACT NA SUSP
NASAL | 0 refills | Status: DC
  Filled 2022-02-04: qty 16, 30d supply, fill #0

## 2022-03-07 ENCOUNTER — Other Ambulatory Visit (HOSPITAL_COMMUNITY): Payer: Self-pay

## 2022-03-07 MED ORDER — OLMESARTAN MEDOXOMIL-HCTZ 40-12.5 MG PO TABS
1.0000 | ORAL_TABLET | Freq: Every day | ORAL | 0 refills | Status: DC
  Filled 2022-03-07: qty 90, 90d supply, fill #0

## 2022-03-10 ENCOUNTER — Other Ambulatory Visit (HOSPITAL_COMMUNITY): Payer: Self-pay

## 2022-03-17 ENCOUNTER — Ambulatory Visit: Payer: 59 | Admitting: Nurse Practitioner

## 2022-03-17 ENCOUNTER — Telehealth: Payer: Self-pay | Admitting: Nurse Practitioner

## 2022-03-17 NOTE — Telephone Encounter (Signed)
Pt was a no show for a NP appointment with Lauren on 03/17/22, I sent a no show letter.

## 2022-04-04 NOTE — Telephone Encounter (Signed)
1st no show, fee waived, letter sent 

## 2022-06-13 ENCOUNTER — Ambulatory Visit: Payer: 59 | Admitting: Nurse Practitioner

## 2022-07-23 ENCOUNTER — Other Ambulatory Visit (HOSPITAL_COMMUNITY): Payer: Self-pay

## 2022-07-23 ENCOUNTER — Ambulatory Visit: Payer: 59 | Admitting: Family Medicine

## 2022-07-23 ENCOUNTER — Encounter: Payer: Self-pay | Admitting: Family Medicine

## 2022-07-23 VITALS — BP 126/82 | HR 77 | Temp 97.6°F | Ht 71.0 in | Wt 301.0 lb

## 2022-07-23 DIAGNOSIS — I10 Essential (primary) hypertension: Secondary | ICD-10-CM | POA: Diagnosis not present

## 2022-07-23 DIAGNOSIS — N950 Postmenopausal bleeding: Secondary | ICD-10-CM

## 2022-07-23 DIAGNOSIS — M25562 Pain in left knee: Secondary | ICD-10-CM

## 2022-07-23 DIAGNOSIS — G8929 Other chronic pain: Secondary | ICD-10-CM | POA: Diagnosis not present

## 2022-07-23 DIAGNOSIS — M25561 Pain in right knee: Secondary | ICD-10-CM | POA: Diagnosis not present

## 2022-07-23 DIAGNOSIS — J4520 Mild intermittent asthma, uncomplicated: Secondary | ICD-10-CM

## 2022-07-23 DIAGNOSIS — Z86018 Personal history of other benign neoplasm: Secondary | ICD-10-CM

## 2022-07-23 LAB — CBC WITH DIFFERENTIAL/PLATELET
Basophils Absolute: 0 10*3/uL (ref 0.0–0.1)
Basophils Relative: 0.7 % (ref 0.0–3.0)
Eosinophils Absolute: 0.2 10*3/uL (ref 0.0–0.7)
Eosinophils Relative: 4 % (ref 0.0–5.0)
HCT: 36.2 % (ref 36.0–46.0)
Hemoglobin: 12.1 g/dL (ref 12.0–15.0)
Lymphocytes Relative: 32.3 % (ref 12.0–46.0)
Lymphs Abs: 1.6 10*3/uL (ref 0.7–4.0)
MCHC: 33.6 g/dL (ref 30.0–36.0)
MCV: 89.3 fl (ref 78.0–100.0)
Monocytes Absolute: 0.4 10*3/uL (ref 0.1–1.0)
Monocytes Relative: 8.7 % (ref 3.0–12.0)
Neutro Abs: 2.7 10*3/uL (ref 1.4–7.7)
Neutrophils Relative %: 54.3 % (ref 43.0–77.0)
Platelets: 263 10*3/uL (ref 150.0–400.0)
RBC: 4.05 Mil/uL (ref 3.87–5.11)
RDW: 13.3 % (ref 11.5–15.5)
WBC: 5 10*3/uL (ref 4.0–10.5)

## 2022-07-23 LAB — COMPREHENSIVE METABOLIC PANEL
ALT: 19 U/L (ref 0–35)
AST: 23 U/L (ref 0–37)
Albumin: 4.2 g/dL (ref 3.5–5.2)
Alkaline Phosphatase: 79 U/L (ref 39–117)
BUN: 15 mg/dL (ref 6–23)
CO2: 26 mEq/L (ref 19–32)
Calcium: 9.4 mg/dL (ref 8.4–10.5)
Chloride: 103 mEq/L (ref 96–112)
Creatinine, Ser: 0.48 mg/dL (ref 0.40–1.20)
GFR: 106.77 mL/min (ref >60.00)
Glucose, Bld: 93 mg/dL (ref 70–99)
Potassium: 3.2 mEq/L — ABNORMAL LOW (ref 3.5–5.1)
Sodium: 137 mEq/L (ref 135–145)
Total Bilirubin: 0.5 mg/dL (ref 0.2–1.2)
Total Protein: 7.6 g/dL (ref 6.0–8.3)

## 2022-07-23 LAB — TSH: TSH: 1.77 u[IU]/mL (ref 0.35–5.50)

## 2022-07-23 LAB — LIPID PANEL
Cholesterol: 165 mg/dL (ref 0–200)
HDL: 29 mg/dL — ABNORMAL LOW (ref >39.00)
LDL Cholesterol: 125 mg/dL — ABNORMAL HIGH (ref 0–99)
NonHDL: 135.81
Total CHOL/HDL Ratio: 6
Triglycerides: 55 mg/dL (ref 0.0–149.0)
VLDL: 11 mg/dL (ref 0.0–40.0)

## 2022-07-23 LAB — HEMOGLOBIN A1C: Hgb A1c MFr Bld: 5.3 % (ref 4.6–6.5)

## 2022-07-23 LAB — T4, FREE: Free T4: 1.15 ng/dL (ref 0.60–1.60)

## 2022-07-23 MED ORDER — MELOXICAM 15 MG PO TABS
15.0000 mg | ORAL_TABLET | Freq: Every day | ORAL | 0 refills | Status: DC
  Filled 2022-07-23: qty 30, 30d supply, fill #0

## 2022-07-23 MED ORDER — OLMESARTAN MEDOXOMIL-HCTZ 40-12.5 MG PO TABS
1.0000 | ORAL_TABLET | Freq: Every day | ORAL | 1 refills | Status: DC
  Filled 2022-07-23: qty 90, 90d supply, fill #0

## 2022-07-23 NOTE — Assessment & Plan Note (Signed)
Blood pressure controlled.  Refilled olmesartan-HCTZ 40-12.5 mg daily.  Recommend low-sodium diet.  Continue with weight loss.

## 2022-07-23 NOTE — Assessment & Plan Note (Signed)
No recent flareups.  Controlled.

## 2022-07-23 NOTE — Patient Instructions (Signed)
Thank you for trusting [place] with your health care.  Please go downstairs for labs before you leave today.  I have referred you to [facility] sports medicine for your chronic knee pain and they will call you to schedule a visit.  I recommend that you call and schedule with an OB/GYN  Obgyn Offices:   [facility] [address] [place], [place] [phone]  [facility] Address: [address] [place], [place] [address] Phone: [phone]  [place] [address] [place], [place] [address] Phone: [phone]  [place], [place] [address] Phone: [phone]

## 2022-07-23 NOTE — Progress Notes (Signed)
New Patient Office Visit  Subjective    Patient ID: Ashley Davis, female    DOB: 08/09/1966  Age: 56 y.o. MRN: [id number]  CC:  Chief Complaint  Patient presents with   Establish Care    Would like to discuss her weight and menopause. Hasn't had a period in a year and recently had some brown spotting.    Medication Refill    Needs all meds refilled except for trazodone     HPI Ashley Davis presents to establish care Previous PCP at [facility], Dr. Mannie Stabile   Hx of Stroke in 2015. No residual effects.   No period in 1 year but started bleeding and spotting.  Hx of fibroids.   Last pap smear done by PCP in 2019.  Needs refills on HTN medication.   Requests refill on meloxicam daily x 2-3 years for bilateral knee pain. Hx of injections. She has been out of it for the past 6 months.   Uses Voltaren gel and it did not help.   She has lost 9 lbs. Taking Semaglutide. Prescribed by [facility].    Works as a Geologist, engineering for [employer].       Outpatient Encounter Medications as of 07/23/2022  Medication Sig   aspirin-acetaminophen-caffeine (EXCEDRIN MIGRAINE) 250-250-65 MG tablet Take 1-3 tablets by mouth every 6 (six) hours as needed for headache or migraine.   fluticasone (FLONASE) 50 MCG/ACT nasal spray Use 2 sprays in each nostril daily until directed to stop   loratadine (CLARITIN) 10 MG tablet Take 1 tablet (10 mg total) by mouth daily.   Semaglutide,0.25 or 0.'5MG'$ /DOS, 2 MG/1.5ML SOPN Inject into the skin once a week.   [DISCONTINUED] meloxicam (MOBIC) 15 MG tablet Take 1 tablet (15 mg total) by mouth daily.   [DISCONTINUED] olmesartan-hydrochlorothiazide (BENICAR HCT) 40-12.5 MG tablet Take 1 tablet by mouth daily   meloxicam (MOBIC) 15 MG tablet Take 1 tablet (15 mg total) by mouth daily.   olmesartan-hydrochlorothiazide (BENICAR HCT) 40-12.5 MG tablet Take 1 tablet by mouth daily   [DISCONTINUED] diphenhydrAMINE (BENADRYL) 25 MG tablet Take 50 mg by mouth  daily as needed for allergies.    [DISCONTINUED] losartan (COZAAR) 50 MG tablet Take 50 mg by mouth daily.   [DISCONTINUED] olmesartan-hydrochlorothiazide (BENICAR HCT) 40-12.5 MG tablet Take 1 tablet by mouth daily   [DISCONTINUED] traZODone (DESYREL) 50 MG tablet Take 1-2 tablets (50-100 mg total) by mouth at bedtime as needed. (Patient not taking: Reported on 07/23/2022)   No facility-administered encounter medications on file as of 07/23/2022.    Past Medical History:  Diagnosis Date   Asthma    Depression    History of bronchitis    Hypertension    Pt states her HTN is due to her migraines   Migraine    Stroke ([place]) 2015   Tonsil stone 04/25/2019    Past Surgical History:  Procedure Laterality Date   BREAST BIOPSY     IR ANGIOGRAM PELVIS SELECTIVE OR SUPRASELECTIVE  04/07/2017   IR ANGIOGRAM PELVIS SELECTIVE OR SUPRASELECTIVE  04/07/2017   IR ANGIOGRAM SELECTIVE EACH ADDITIONAL VESSEL  04/07/2017   IR ANGIOGRAM SELECTIVE EACH ADDITIONAL VESSEL  04/07/2017   IR ANGIOGRAM VISCERAL SELECTIVE  04/07/2017   IR EMBO TUMOR ORGAN ISCHEMIA INFARCT INC GUIDE ROADMAPPING  04/07/2017   IR RADIOLOGIST EVAL & MGMT  01/01/2017   IR RADIOLOGIST EVAL & MGMT  04/30/2017   IR US GUIDE VASC ACCESS RIGHT  04/07/2017   NO PAST SURGERIES  Family History  Problem Relation Age of Onset   Diabetes Mother    Stroke Mother    Cancer Mother    Headache Mother    Diabetes Father    Other Father        neuropathy   Headache Sister    Aneurysm Sister        3 brain surgeries    Social History   Socioeconomic History   Marital status: Married    Spouse name: Darren   Number of children: 2   Years of education: College   Highest education level: Not on file  Occupational History    Comment: Med Tech, [employer]  Tobacco Use   Smoking status: Former    Packs/day: 0.25    Years: 30.00    Total pack years: 7.50    Types: Cigarettes    Quit date: 04/06/2017    Years since quitting: 5.2    Smokeless tobacco: Never  Vaping Use   Vaping Use: Never used  Substance and Sexual Activity   Alcohol use: Yes    Comment: wine 2-3 x month   Drug use: No   Sexual activity: Not on file  Other Topics Concern   Not on file  Social History Narrative   Patient lives at home with family.   Caffeine Use: 1 cup 2-3 times weekly   Social Determinants of Health   Financial Resource Strain: Not on file  Food Insecurity: Not on file  Transportation Needs: Not on file  Physical Activity: Not on file  Stress: Not on file  Social Connections: Not on file  Intimate Partner Violence: Not on file    ROS      Objective    BP 126/82 (BP Location: Left Arm, Patient Position: Sitting, Cuff Size: Large)   Pulse 77   Temp 97.6 F (36.4 C) (Temporal)   Ht '5\' 11"'$  (1.803 m)   Wt (!) 301 lb (136.5 kg)   LMP 03/31/2017 (Exact Date) Comment: neg preg test 04/07/17  SpO2 97%   BMI 41.98 kg/m   Physical Exam      Assessment & Plan:   Problem List Items Addressed This Visit       Cardiovascular and Mediastinum   Primary hypertension - Primary    Blood pressure controlled.  Refilled olmesartan-HCTZ 40-12.5 mg daily.  Recommend low-sodium diet.  Continue with weight loss.      Relevant Medications   olmesartan-hydrochlorothiazide (BENICAR HCT) 40-12.5 MG tablet   Other Relevant Orders   CBC with Differential/Platelet   Comprehensive metabolic panel     Respiratory   Asthma, chronic    No recent flareups.  Controlled.        Other   Chronic pain of both knees    She has not been taking meloxicam for the past 6 months since being out of it.  Prior to that she was taking it daily for years.  Topical analgesic is not helping.  She has a history of getting knee injections.  Refilled meloxicam to use as needed and referral to [facility] sports [facility].      Relevant Medications   meloxicam (MOBIC) 15 MG tablet   Other Relevant Orders   Ambulatory referral to Sports Medicine    Post-menopausal bleeding    Discussed the importance of scheduling with an OB/GYN for further evaluation of postmenopausal bleeding.      Relevant Orders   CBC with Differential/Platelet   TSH   T4, free   Severe obesity (  BMI >= 40) (HCC)    Reports 9 pound weight loss since being on semaglutide.  She will continue seeing meds by for now and follow-up with me in 4 weeks.  Check labs to look for complications for obesity.      Relevant Medications   Semaglutide,0.25 or 0.'5MG'$ /DOS, 2 MG/1.5ML SOPN   Other Relevant Orders   Hemoglobin A1c   Lipid panel   TSH   T4, free   Other Visit Diagnoses     History of uterine fibroid           Return in about 4 weeks (around 08/20/2022).   Harland Dingwall, NP-C

## 2022-07-23 NOTE — Assessment & Plan Note (Signed)
Discussed the importance of scheduling with an OB/GYN for further evaluation of postmenopausal bleeding.

## 2022-07-23 NOTE — Assessment & Plan Note (Signed)
Reports 9 pound weight loss since being on semaglutide.  She will continue seeing meds by for now and follow-up with me in 4 weeks.  Check labs to look for complications for obesity.

## 2022-07-23 NOTE — Assessment & Plan Note (Signed)
She has not been taking meloxicam for the past 6 months since being out of it.  Prior to that she was taking it daily for years.  Topical analgesic is not helping.  She has a history of getting knee injections.  Refilled meloxicam to use as needed and referral to [facility].

## 2022-07-25 ENCOUNTER — Other Ambulatory Visit: Payer: Self-pay | Admitting: Family Medicine

## 2022-07-25 DIAGNOSIS — E876 Hypokalemia: Secondary | ICD-10-CM

## 2022-07-25 MED ORDER — POTASSIUM CHLORIDE CRYS ER 20 MEQ PO TBCR
40.0000 meq | EXTENDED_RELEASE_TABLET | Freq: Every day | ORAL | 0 refills | Status: DC
  Filled 2022-07-25: qty 8, 4d supply, fill #0

## 2022-07-26 ENCOUNTER — Other Ambulatory Visit (HOSPITAL_COMMUNITY): Payer: Self-pay

## 2022-08-01 NOTE — Progress Notes (Deleted)
   I, Peterson Lombard, LAT, ATC acting as a scribe for Lynne Leader, MD.  Subjective:    CC: Bilat knee pain  HPI: Pt is a 56 y/o female c/o bilat knee pain x 2-3 years. Pt locates pain to   Knee swelling: Mechanical symptoms: Aggravates: Treatments tried: prior injections, Voltaren gel  Pertinent review of Systems: ***  Relevant historical information: ***   Objective:   There were no vitals filed for this visit. General: Well Developed, well nourished, and in no acute distress.   MSK: ***  Lab and Radiology Results No results found for this or any previous visit (from the past 72 hour(s)). No results found.    Impression and Recommendations:    Assessment and Plan: 56 y.o. female with ***.  PDMP not reviewed this encounter. No orders of the defined types were placed in this encounter.  No orders of the defined types were placed in this encounter.   Discussed warning signs or symptoms. Please see discharge instructions. Patient expresses understanding.   ***

## 2022-08-05 ENCOUNTER — Ambulatory Visit: Payer: 59 | Admitting: Family Medicine

## 2022-08-05 DIAGNOSIS — G8929 Other chronic pain: Secondary | ICD-10-CM

## 2022-08-19 ENCOUNTER — Encounter: Payer: Self-pay | Admitting: Family Medicine

## 2022-08-19 ENCOUNTER — Other Ambulatory Visit: Payer: Self-pay

## 2022-08-19 ENCOUNTER — Ambulatory Visit: Payer: 59 | Admitting: Family Medicine

## 2022-08-19 DIAGNOSIS — Z1211 Encounter for screening for malignant neoplasm of colon: Secondary | ICD-10-CM | POA: Diagnosis not present

## 2022-08-19 DIAGNOSIS — Z9189 Other specified personal risk factors, not elsewhere classified: Secondary | ICD-10-CM

## 2022-08-19 DIAGNOSIS — E559 Vitamin D deficiency, unspecified: Secondary | ICD-10-CM

## 2022-08-19 DIAGNOSIS — E876 Hypokalemia: Secondary | ICD-10-CM | POA: Diagnosis not present

## 2022-08-19 DIAGNOSIS — I10 Essential (primary) hypertension: Secondary | ICD-10-CM

## 2022-08-19 DIAGNOSIS — T502X5A Adverse effect of carbonic-anhydrase inhibitors, benzothiadiazides and other diuretics, initial encounter: Secondary | ICD-10-CM

## 2022-08-19 DIAGNOSIS — J302 Other seasonal allergic rhinitis: Secondary | ICD-10-CM | POA: Diagnosis not present

## 2022-08-19 DIAGNOSIS — E782 Mixed hyperlipidemia: Secondary | ICD-10-CM

## 2022-08-19 LAB — BASIC METABOLIC PANEL
BUN: 10 mg/dL (ref 6–23)
CO2: 26 mEq/L (ref 19–32)
Calcium: 9.5 mg/dL (ref 8.4–10.5)
Chloride: 106 mEq/L (ref 96–112)
Creatinine, Ser: 0.59 mg/dL (ref 0.40–1.20)
GFR: 101.54 mL/min (ref >60.00)
Glucose, Bld: 107 mg/dL — ABNORMAL HIGH (ref 70–99)
Potassium: 3.6 mEq/L (ref 3.5–5.1)
Sodium: 138 mEq/L (ref 135–145)

## 2022-08-19 LAB — VITAMIN D 25 HYDROXY (VIT D DEFICIENCY, FRACTURES): VITD: 29.94 ng/mL — ABNORMAL LOW (ref 30.00–100.00)

## 2022-08-19 MED ORDER — FLUTICASONE PROPIONATE 50 MCG/ACT NA SUSP
2.0000 | Freq: Every day | NASAL | 3 refills | Status: AC
  Filled 2022-08-19: qty 48, 90d supply, fill #0
  Filled 2022-12-10: qty 48, 90d supply, fill #1
  Filled 2023-03-16: qty 48, 90d supply, fill #2

## 2022-08-19 MED ORDER — TRIAMTERENE-HCTZ 37.5-25 MG PO CAPS
1.0000 | ORAL_CAPSULE | Freq: Every day | ORAL | 0 refills | Status: DC
  Filled 2022-08-19: qty 90, 90d supply, fill #0

## 2022-08-19 MED ORDER — OLMESARTAN MEDOXOMIL 40 MG PO TABS
40.0000 mg | ORAL_TABLET | Freq: Every day | ORAL | 1 refills | Status: DC
  Filled 2022-08-19: qty 90, 90d supply, fill #0

## 2022-08-19 NOTE — Progress Notes (Signed)
Please maker her aware of the changes to her blood pressure medication. Your potassium is still low normal. Stop the olmesartan-HCTZ pill and potassium supplement. Start taking olmesartan (separate pill now) and Dyazide. As we discussed, this will help with better blood pressure control and not lower your potassium. Let me know if you have any questions. Follow up in 3 months or sooner if any issues.

## 2022-08-19 NOTE — Assessment & Plan Note (Signed)
ASCVD 10-year risk 6.8%.

## 2022-08-19 NOTE — Patient Instructions (Signed)
Please go downstairs for labs.   You will hear from [facility].

## 2022-08-19 NOTE — Assessment & Plan Note (Signed)
Continue over-the-counter vitamin D supplement

## 2022-08-19 NOTE — Assessment & Plan Note (Signed)
I calculated her ASCVD 10-year risk in the exam room with her.  Score is 6.8%.  Reviewed notes from hospitalization in 2015 when patient thought she had a stroke/TIA.  Reviewed neurology note stating stroke was ruled out and that the patient had a complicated migraine.  We will hold off on statin therapy at this time.  Recommend low-fat, low-cholesterol diet and getting adequate physical activity.  Referral to [facility] health weight management for assistance with weight loss.

## 2022-08-19 NOTE — Assessment & Plan Note (Signed)
This will be her first screening colonoscopy.

## 2022-08-19 NOTE — Assessment & Plan Note (Signed)
Close to goal range.  BMP shows potassium is low normal with normal renal function.  Stop olmesartan-HCTZ combination due to hypokalemia and start olmesartan 40 mg daily as well as Dyazide daily.  She will not need to take a potassium supplement at this time.  Keep an eye on blood pressure and follow-up in 3 months or sooner if any concerns.

## 2022-08-19 NOTE — Assessment & Plan Note (Signed)
She was getting semaglutide from compound pharmacy and has since stopped.  Medication was not affordable.  Referral to [facility] health weight management for further evaluation and assistance with weight loss.

## 2022-08-19 NOTE — Assessment & Plan Note (Signed)
Flonase refilled.  Treat as needed.

## 2022-08-19 NOTE — Progress Notes (Signed)
Subjective:     Patient ID: Ashley Davis, female    DOB: 1965/09/29, 56 y.o.   MRN: [id number]  Chief Complaint  Patient presents with   Obesity    4 week f/u, would like to be prescribed the semaglutide    HPI Patient is in today for follow up on obesity and other chronic health conditions.   Stopped Semaglutide due to expense.   Denies fever, chills, dizziness, chest pain, palpitations, shortness of breath, abdominal pain, N/V/D, urinary symptoms, LE edema.    Health Maintenance Due  Topic Date Due   COLONOSCOPY (Pts 45-72yr Insurance coverage will need to be confirmed)  Never done   MAMMOGRAM  08/17/2020    Past Medical History:  Diagnosis Date   Asthma    Depression    History of bronchitis    Hypertension    Pt states her HTN is due to her migraines   Migraine    Tonsil stone 04/25/2019    Past Surgical History:  Procedure Laterality Date   BREAST BIOPSY     IR ANGIOGRAM PELVIS SELECTIVE OR SUPRASELECTIVE  04/07/2017   IR ANGIOGRAM PELVIS SELECTIVE OR SUPRASELECTIVE  04/07/2017   IR ANGIOGRAM SELECTIVE EACH ADDITIONAL VESSEL  04/07/2017   IR ANGIOGRAM SELECTIVE EACH ADDITIONAL VESSEL  04/07/2017   IR ANGIOGRAM VISCERAL SELECTIVE  04/07/2017   IR EMBO TUMOR ORGAN ISCHEMIA INFARCT INC GUIDE ROADMAPPING  04/07/2017   IR RADIOLOGIST EVAL & MGMT  01/01/2017   IR RADIOLOGIST EVAL & MGMT  04/30/2017   IR [place] VASC ACCESS RIGHT  04/07/2017   NO PAST SURGERIES      Family History  Problem Relation Age of Onset   Diabetes Mother    Stroke Mother    Cancer Mother    Headache Mother    Diabetes Father    Other Father        neuropathy   Headache Sister    Aneurysm Sister        3 brain surgeries    Social History   Socioeconomic History   Marital status: Married    Spouse name: Darren   Number of children: 2   Years of education: College   Highest education level: Not on file  Occupational History    Comment: Med Tech, [employer]  Tobacco Use    Smoking status: Former    Packs/day: 0.25    Years: 30.00    Total pack years: 7.50    Types: Cigarettes    Quit date: 04/06/2017    Years since quitting: 5.3   Smokeless tobacco: Never  Vaping Use   Vaping Use: Never used  Substance and Sexual Activity   Alcohol use: Yes    Comment: wine 2-3 x month   Drug use: No   Sexual activity: Not on file  Other Topics Concern   Not on file  Social History Narrative   Patient lives at home with family.   Caffeine Use: 1 cup 2-3 times weekly   Social Determinants of Health   Financial Resource Strain: Not on file  Food Insecurity: Not on file  Transportation Needs: Not on file  Physical Activity: Not on file  Stress: Not on file  Social Connections: Not on file  Intimate Partner Violence: Not on file    Outpatient Medications Prior to Visit  Medication Sig Dispense Refill   aspirin-acetaminophen-caffeine (EXCEDRIN MIGRAINE) 250-250-65 MG tablet Take 1-3 tablets by mouth every 6 (six) hours as needed for headache or migraine.  loratadine (CLARITIN) 10 MG tablet Take 1 tablet (10 mg total) by mouth daily. 90 tablet 0   meloxicam (MOBIC) 15 MG tablet Take 1 tablet (15 mg total) by mouth daily. 30 tablet 0   olmesartan-hydrochlorothiazide (BENICAR HCT) 40-12.5 MG tablet Take 1 tablet by mouth daily 90 tablet 1   potassium chloride SA (KLOR-CON M) 20 MEQ tablet Take 2 tablets (40 mEq total) by mouth daily. 8 tablet 0   fluticasone (FLONASE) 50 MCG/ACT nasal spray Use 2 sprays in each nostril daily until directed to stop (Patient not taking: Reported on 08/19/2022) 48 g 0   Semaglutide,0.25 or 0.'5MG'$ /DOS, 2 MG/1.5ML SOPN Inject into the skin once a week. (Patient not taking: Reported on 08/19/2022)     No facility-administered medications prior to visit.    Allergies  Allergen Reactions   Augmentin [Amoxicillin-Pot Clavulanate] Palpitations    Has patient had a PCN reaction causing immediate rash, facial/tongue/throat swelling, SOB or  lightheadedness with hypotension: yes, Heart palpitations and chest pressure Has patient had a PCN reaction causing severe rash involving mucus membranes or skin necrosis: no Has patient had a PCN reaction that required hospitalization: no Has patient had a PCN reaction occurring within the last 10 years: yes If all of the above answers are "NO", then may proceed with Cephalosporin use.   Shellfish Allergy Hives    ROS     Objective:    Physical Exam Constitutional:      General: She is not in acute distress.    Appearance: She is not ill-appearing.  Eyes:     Extraocular Movements: Extraocular movements intact.     Conjunctiva/sclera: Conjunctivae normal.  Cardiovascular:     Rate and Rhythm: Normal rate.  Pulmonary:     Effort: Pulmonary effort is normal.  Musculoskeletal:     Right lower leg: No edema.     Left lower leg: No edema.  Neurological:     General: No focal deficit present.     Mental Status: She is alert and oriented to person, place, and time.  Psychiatric:        Mood and Affect: Mood normal.        Behavior: Behavior normal.     BP 124/84 (BP Location: Left Arm, Patient Position: Sitting, Cuff Size: Large)   Pulse 75   Temp 97.6 F (36.4 C) (Temporal)   Ht '5\' 11"'$  (1.803 m)   Wt (!) 302 lb (137 kg)   LMP 03/31/2017 (Exact Date) Comment: neg preg test 04/07/17  SpO2 98%   BMI 42.12 kg/m  Wt Readings from Last 3 Encounters:  08/19/22 (!) 302 lb (137 kg)  07/23/22 (!) 301 lb (136.5 kg)  03/07/21 285 lb 15 oz (129.7 kg)       Assessment & Plan:   Problem List Items Addressed This Visit       Cardiovascular and Mediastinum   Primary hypertension    Close to goal range.  BMP shows potassium is low normal with normal renal function.  Stop olmesartan-HCTZ combination due to hypokalemia and start olmesartan 40 mg daily as well as Dyazide daily.  She will not need to take a potassium supplement at this time.  Keep an eye on blood pressure and follow-up  in 3 months or sooner if any concerns.      Relevant Medications   olmesartan (BENICAR) 40 MG tablet   triamterene-hydrochlorothiazide (DYAZIDE) 37.5-25 MG capsule   Other Relevant Orders   Basic metabolic panel (Completed)   Amb  Ref to Medical Weight Management     Other   10 year risk of MI or stroke < 7.5%    I calculated her ASCVD 10-year risk in the exam room with her.  Score is 6.8%.  Reviewed notes from hospitalization in 2015 when patient thought she had a stroke/TIA.  Reviewed neurology note stating stroke was ruled out and that the patient had a complicated migraine.  We will hold off on statin therapy at this time.  Recommend low-fat, low-cholesterol diet and getting adequate physical activity.  Referral to [facility] health weight management for assistance with weight loss.      Colon cancer screening    This will be her first screening colonoscopy.      Relevant Orders   Ambulatory referral to Gastroenterology   Diuretic-induced hypokalemia   Relevant Orders   Basic metabolic panel (Completed)   Mixed hyperlipidemia    ASCVD 10-year risk 6.8%.      Relevant Medications   olmesartan (BENICAR) 40 MG tablet   triamterene-hydrochlorothiazide (DYAZIDE) 37.5-25 MG capsule   Seasonal allergies    Flonase refilled.  Treat as needed.      Relevant Medications   fluticasone (FLONASE) 50 MCG/ACT nasal spray   Severe obesity (BMI >= 40) ([place]) - Primary    She was getting semaglutide from compound pharmacy and has since stopped.  Medication was not affordable.  Referral to [facility] health weight management for further evaluation and assistance with weight loss.      Relevant Orders   Amb Ref to Medical Weight Management   Vitamin D deficiency    Continue over-the-counter vitamin D supplement      Relevant Orders   VITAMIN D 25 Hydroxy (Vit-D Deficiency, Fractures) (Completed)   Visit time 35 minutes in face to face communication with patient and coordination of care,  additional 10 minutes spent in record review, coordination or care, ordering tests, communicating/referring to other healthcare professionals, documenting in medical records all on the same day of the visit for total time 45 minutes spent on the visit.    I have discontinued Ailani D. Odonell's Semaglutide(0.25 or 0.'5MG'$ /DOS), olmesartan-hydrochlorothiazide, and potassium chloride SA. I have also changed her fluticasone. Additionally, I am having her start on olmesartan and triamterene-hydrochlorothiazide. Lastly, I am having her maintain her aspirin-acetaminophen-caffeine, loratadine, and meloxicam.  Meds ordered this encounter  Medications   fluticasone (FLONASE) 50 MCG/ACT nasal spray    Sig: Place 2 sprays into both nostrils daily until directed to stop.    Dispense:  48 g    Refill:  3    Order Specific Question:   Supervising Provider    Answer:   Pricilla Holm A [[id number]]   olmesartan (BENICAR) 40 MG tablet    Sig: Take 1 tablet (40 mg total) by mouth daily.    Dispense:  90 tablet    Refill:  1    Order Specific Question:   Supervising Provider    Answer:   Pricilla Holm A [[id number]]   triamterene-hydrochlorothiazide (DYAZIDE) 37.5-25 MG capsule    Sig: Take 1 each (1 capsule total) by mouth daily.    Dispense:  90 capsule    Refill:  0    Order Specific Question:   Supervising Provider    Answer:   Pricilla Holm A [[id number]]

## 2022-08-20 ENCOUNTER — Other Ambulatory Visit: Payer: Self-pay

## 2022-08-20 ENCOUNTER — Other Ambulatory Visit (HOSPITAL_COMMUNITY): Payer: Self-pay

## 2022-08-28 ENCOUNTER — Ambulatory Visit: Payer: 59 | Admitting: Family Medicine

## 2022-09-11 DIAGNOSIS — Z6841 Body Mass Index (BMI) 40.0 and over, adult: Secondary | ICD-10-CM | POA: Diagnosis not present

## 2022-09-11 DIAGNOSIS — R7309 Other abnormal glucose: Secondary | ICD-10-CM | POA: Diagnosis not present

## 2022-09-11 DIAGNOSIS — E0789 Other specified disorders of thyroid: Secondary | ICD-10-CM | POA: Diagnosis not present

## 2022-09-11 DIAGNOSIS — N951 Menopausal and female climacteric states: Secondary | ICD-10-CM | POA: Diagnosis not present

## 2022-09-11 DIAGNOSIS — Z130 Encounter for screening for diseases of the blood and blood-forming organs and certain disorders involving the immune mechanism: Secondary | ICD-10-CM | POA: Diagnosis not present

## 2022-09-11 DIAGNOSIS — E7800 Pure hypercholesterolemia, unspecified: Secondary | ICD-10-CM | POA: Diagnosis not present

## 2022-09-11 DIAGNOSIS — Z1329 Encounter for screening for other suspected endocrine disorder: Secondary | ICD-10-CM | POA: Diagnosis not present

## 2022-09-15 ENCOUNTER — Encounter (INDEPENDENT_AMBULATORY_CARE_PROVIDER_SITE_OTHER): Payer: Self-pay | Admitting: Internal Medicine

## 2022-09-16 ENCOUNTER — Other Ambulatory Visit (HOSPITAL_COMMUNITY): Payer: Self-pay

## 2022-09-16 ENCOUNTER — Other Ambulatory Visit: Payer: Self-pay

## 2022-09-16 ENCOUNTER — Other Ambulatory Visit: Payer: Self-pay | Admitting: Nurse Practitioner

## 2022-09-16 DIAGNOSIS — N951 Menopausal and female climacteric states: Secondary | ICD-10-CM | POA: Diagnosis not present

## 2022-09-16 DIAGNOSIS — Z1331 Encounter for screening for depression: Secondary | ICD-10-CM | POA: Diagnosis not present

## 2022-09-16 DIAGNOSIS — I10 Essential (primary) hypertension: Secondary | ICD-10-CM | POA: Diagnosis not present

## 2022-09-16 DIAGNOSIS — Z1339 Encounter for screening examination for other mental health and behavioral disorders: Secondary | ICD-10-CM | POA: Diagnosis not present

## 2022-09-16 DIAGNOSIS — Z6841 Body Mass Index (BMI) 40.0 and over, adult: Secondary | ICD-10-CM | POA: Diagnosis not present

## 2022-09-16 DIAGNOSIS — E7800 Pure hypercholesterolemia, unspecified: Secondary | ICD-10-CM | POA: Diagnosis not present

## 2022-09-16 DIAGNOSIS — E0789 Other specified disorders of thyroid: Secondary | ICD-10-CM

## 2022-09-16 MED ORDER — PROGESTERONE MICRONIZED 100 MG PO CAPS
100.0000 mg | ORAL_CAPSULE | Freq: Every day | ORAL | 1 refills | Status: DC
  Filled 2022-09-16: qty 30, 30d supply, fill #0
  Filled 2022-10-15: qty 30, 30d supply, fill #1

## 2022-09-16 MED ORDER — WEGOVY 0.25 MG/0.5ML ~~LOC~~ SOAJ
0.2500 mg | SUBCUTANEOUS | 0 refills | Status: DC
  Filled 2022-09-16 – 2022-10-08 (×3): qty 2, 28d supply, fill #0

## 2022-09-16 MED ORDER — ESTRADIOL 0.0375 MG/24HR TD PTTW
1.0000 | MEDICATED_PATCH | TRANSDERMAL | 1 refills | Status: DC
  Filled 2022-09-16: qty 8, 28d supply, fill #0
  Filled 2022-10-09: qty 8, 28d supply, fill #1

## 2022-09-19 ENCOUNTER — Other Ambulatory Visit (HOSPITAL_COMMUNITY): Payer: Self-pay

## 2022-09-23 DIAGNOSIS — E669 Obesity, unspecified: Secondary | ICD-10-CM | POA: Diagnosis not present

## 2022-09-23 DIAGNOSIS — Z6841 Body Mass Index (BMI) 40.0 and over, adult: Secondary | ICD-10-CM | POA: Diagnosis not present

## 2022-09-23 DIAGNOSIS — I10 Essential (primary) hypertension: Secondary | ICD-10-CM | POA: Diagnosis not present

## 2022-09-24 ENCOUNTER — Other Ambulatory Visit (HOSPITAL_COMMUNITY): Payer: Self-pay

## 2022-09-30 DIAGNOSIS — E669 Obesity, unspecified: Secondary | ICD-10-CM | POA: Diagnosis not present

## 2022-09-30 DIAGNOSIS — E7800 Pure hypercholesterolemia, unspecified: Secondary | ICD-10-CM | POA: Diagnosis not present

## 2022-09-30 DIAGNOSIS — Z6841 Body Mass Index (BMI) 40.0 and over, adult: Secondary | ICD-10-CM | POA: Diagnosis not present

## 2022-10-01 ENCOUNTER — Other Ambulatory Visit (HOSPITAL_COMMUNITY): Payer: Self-pay

## 2022-10-01 DIAGNOSIS — N950 Postmenopausal bleeding: Secondary | ICD-10-CM | POA: Diagnosis not present

## 2022-10-01 DIAGNOSIS — Z124 Encounter for screening for malignant neoplasm of cervix: Secondary | ICD-10-CM | POA: Diagnosis not present

## 2022-10-01 DIAGNOSIS — Z6841 Body Mass Index (BMI) 40.0 and over, adult: Secondary | ICD-10-CM | POA: Diagnosis not present

## 2022-10-01 DIAGNOSIS — Z01419 Encounter for gynecological examination (general) (routine) without abnormal findings: Secondary | ICD-10-CM | POA: Diagnosis not present

## 2022-10-01 DIAGNOSIS — Z1151 Encounter for screening for human papillomavirus (HPV): Secondary | ICD-10-CM | POA: Diagnosis not present

## 2022-10-02 ENCOUNTER — Other Ambulatory Visit: Payer: Self-pay | Admitting: Family Medicine

## 2022-10-02 ENCOUNTER — Other Ambulatory Visit (HOSPITAL_COMMUNITY): Payer: Self-pay

## 2022-10-02 DIAGNOSIS — G8929 Other chronic pain: Secondary | ICD-10-CM

## 2022-10-02 LAB — HM PAP SMEAR: HPV, high-risk: NEGATIVE

## 2022-10-02 MED ORDER — MELOXICAM 15 MG PO TABS
15.0000 mg | ORAL_TABLET | Freq: Every day | ORAL | 0 refills | Status: DC
  Filled 2022-10-02: qty 30, 30d supply, fill #0

## 2022-10-02 NOTE — Telephone Encounter (Signed)
LOV: 08/19/22 Last fill: 07/23/22, 30 tablets 0 refills

## 2022-10-03 ENCOUNTER — Encounter: Payer: Self-pay | Admitting: Gastroenterology

## 2022-10-06 ENCOUNTER — Ambulatory Visit
Admission: RE | Admit: 2022-10-06 | Discharge: 2022-10-06 | Disposition: A | Discharge status: Home / Self Care | Payer: Commercial Managed Care - PPO | Source: Ambulatory Visit | Attending: Nurse Practitioner | Admitting: Nurse Practitioner

## 2022-10-06 DIAGNOSIS — E049 Nontoxic goiter, unspecified: Secondary | ICD-10-CM | POA: Diagnosis not present

## 2022-10-06 DIAGNOSIS — E0789 Other specified disorders of thyroid: Secondary | ICD-10-CM

## 2022-10-08 ENCOUNTER — Other Ambulatory Visit: Payer: Self-pay

## 2022-10-09 ENCOUNTER — Other Ambulatory Visit: Payer: Self-pay

## 2022-10-13 ENCOUNTER — Other Ambulatory Visit: Payer: Self-pay

## 2022-10-14 DIAGNOSIS — E7800 Pure hypercholesterolemia, unspecified: Secondary | ICD-10-CM | POA: Diagnosis not present

## 2022-10-14 DIAGNOSIS — E669 Obesity, unspecified: Secondary | ICD-10-CM | POA: Diagnosis not present

## 2022-10-14 DIAGNOSIS — Z6841 Body Mass Index (BMI) 40.0 and over, adult: Secondary | ICD-10-CM | POA: Diagnosis not present

## 2022-10-15 ENCOUNTER — Other Ambulatory Visit: Payer: Self-pay

## 2022-10-21 ENCOUNTER — Other Ambulatory Visit (HOSPITAL_COMMUNITY): Payer: Self-pay

## 2022-10-21 DIAGNOSIS — Z6841 Body Mass Index (BMI) 40.0 and over, adult: Secondary | ICD-10-CM | POA: Diagnosis not present

## 2022-10-21 DIAGNOSIS — E7800 Pure hypercholesterolemia, unspecified: Secondary | ICD-10-CM | POA: Diagnosis not present

## 2022-10-21 DIAGNOSIS — R110 Nausea: Secondary | ICD-10-CM | POA: Diagnosis not present

## 2022-10-21 DIAGNOSIS — I10 Essential (primary) hypertension: Secondary | ICD-10-CM | POA: Diagnosis not present

## 2022-10-21 DIAGNOSIS — R7309 Other abnormal glucose: Secondary | ICD-10-CM | POA: Diagnosis not present

## 2022-10-21 DIAGNOSIS — N951 Menopausal and female climacteric states: Secondary | ICD-10-CM | POA: Diagnosis not present

## 2022-10-21 MED ORDER — ONDANSETRON HCL 4 MG PO TABS
4.0000 mg | ORAL_TABLET | Freq: Every day | ORAL | 0 refills | Status: DC
  Filled 2022-10-21: qty 30, 30d supply, fill #0

## 2022-10-21 MED ORDER — WEGOVY 1 MG/0.5ML ~~LOC~~ SOAJ
1.0000 mg | SUBCUTANEOUS | 0 refills | Status: DC
  Filled 2022-10-21: qty 3, 28d supply, fill #0
  Filled 2022-11-25: qty 2, 28d supply, fill #0

## 2022-10-27 ENCOUNTER — Other Ambulatory Visit: Payer: Self-pay

## 2022-10-27 ENCOUNTER — Ambulatory Visit (AMBULATORY_SURGERY_CENTER): Payer: Commercial Managed Care - PPO

## 2022-10-27 VITALS — Ht 69.0 in | Wt 301.0 lb

## 2022-10-27 DIAGNOSIS — Z1211 Encounter for screening for malignant neoplasm of colon: Secondary | ICD-10-CM

## 2022-10-27 MED ORDER — NA SULFATE-K SULFATE-MG SULF 17.5-3.13-1.6 GM/177ML PO SOLN: 1.0000 | Freq: Once | ORAL | 0 refills | Status: AC

## 2022-10-27 NOTE — Progress Notes (Signed)
Denies allergies to eggs or soy products. Denies complication of anesthesia or sedation. Denies use of weight loss medication. Denies use of O2.   Emmi instructions given for colonoscopy.  

## 2022-10-29 ENCOUNTER — Encounter: Payer: Self-pay | Admitting: Gastroenterology

## 2022-10-30 ENCOUNTER — Other Ambulatory Visit: Payer: Self-pay

## 2022-10-30 ENCOUNTER — Other Ambulatory Visit (HOSPITAL_COMMUNITY): Payer: Self-pay

## 2022-11-07 ENCOUNTER — Other Ambulatory Visit (HOSPITAL_COMMUNITY): Payer: Self-pay

## 2022-11-09 ENCOUNTER — Other Ambulatory Visit (HOSPITAL_COMMUNITY): Payer: Self-pay

## 2022-11-11 ENCOUNTER — Other Ambulatory Visit (HOSPITAL_COMMUNITY): Payer: Self-pay

## 2022-11-12 ENCOUNTER — Other Ambulatory Visit (HOSPITAL_COMMUNITY): Payer: Self-pay

## 2022-11-17 ENCOUNTER — Ambulatory Visit (AMBULATORY_SURGERY_CENTER): Payer: Commercial Managed Care - PPO | Admitting: Gastroenterology

## 2022-11-17 ENCOUNTER — Encounter: Payer: Self-pay | Admitting: Gastroenterology

## 2022-11-17 VITALS — BP 106/72 | HR 70 | Temp 96.8°F | Resp 11 | Ht 71.0 in | Wt 301.0 lb

## 2022-11-17 DIAGNOSIS — K639 Disease of intestine, unspecified: Secondary | ICD-10-CM | POA: Diagnosis not present

## 2022-11-17 DIAGNOSIS — J45909 Unspecified asthma, uncomplicated: Secondary | ICD-10-CM | POA: Diagnosis not present

## 2022-11-17 DIAGNOSIS — K633 Ulcer of intestine: Secondary | ICD-10-CM

## 2022-11-17 DIAGNOSIS — Z1211 Encounter for screening for malignant neoplasm of colon: Secondary | ICD-10-CM

## 2022-11-17 DIAGNOSIS — K529 Noninfective gastroenteritis and colitis, unspecified: Secondary | ICD-10-CM | POA: Diagnosis not present

## 2022-11-17 DIAGNOSIS — I10 Essential (primary) hypertension: Secondary | ICD-10-CM | POA: Diagnosis not present

## 2022-11-17 HISTORY — PX: COLONOSCOPY: SHX174

## 2022-11-17 MED ORDER — SODIUM CHLORIDE 0.9 % IV SOLN: 500.0000 mL | Freq: Once | INTRAVENOUS | Status: DC

## 2022-11-17 NOTE — Progress Notes (Signed)
Called to room to assist during endoscopic procedure.  Patient ID and intended procedure confirmed with present staff. Received instructions for my participation in the procedure from the performing physician.  

## 2022-11-17 NOTE — Op Note (Signed)
Ashley Davis Patient Name: Ashley Davis Procedure Date: 11/17/2022 1:52 PM MRN: [id number] Endoscopist: Remo Lipps P. Havery Moros , MD, [id number] Age: 57 Referring MD:  Date of Birth: 1965/12/06 Gender: Female Account #: [id number] Procedure:                Colonoscopy Indications:              Screening for colorectal malignant neoplasm, This                            is the patient's first colonoscopy Medicines:                Monitored Anesthesia Care Procedure:                Pre-Anesthesia Assessment:                           - Prior to the procedure, a History and Physical                            was performed, and patient medications and                            allergies were reviewed. The patient's tolerance of                            previous anesthesia was also reviewed. The risks                            and benefits of the procedure and the sedation                            options and risks were discussed with the patient.                            All questions were answered, and informed consent                            was obtained. Prior Anticoagulants: The patient has                            taken no anticoagulant or antiplatelet agents. ASA                            Grade Assessment: III - A patient with severe                            systemic disease. After reviewing the risks and                            benefits, the patient was deemed in satisfactory                            condition to undergo the procedure.  After obtaining informed consent, the colonoscope                            was passed under direct vision. Throughout the                            procedure, the patient's blood pressure, pulse, and                            oxygen saturations were monitored continuously. The                            Olympus CF-HQ190L [id number]) Colonoscope was                            introduced  through the anus and advanced to the the                            terminal ileum, with identification of the                            appendiceal orifice and IC valve. The colonoscopy                            was performed without difficulty. The patient                            tolerated the procedure well. The quality of the                            bowel preparation was good. The terminal ileum,                            ileocecal valve, appendiceal orifice, and rectum                            were photographed. Scope In: 1:59:25 PM Scope Out: 2:19:01 PM Scope Withdrawal Time: 0 hours 12 minutes 7 seconds  Total Procedure Duration: 0 hours 19 minutes 36 seconds  Findings:                 The perianal and digital rectal examinations were                            normal.                           The terminal ileum appeared normal.                           Patchy mild inflammation characterized by erosions                            / erythema was found in the cecum and an ulcer at  the ileocecal valve. Biopsies were taken with a                            cold forceps for histology.                           Multiple small-mouthed diverticula were found in                            the sigmoid colon.                           Internal hemorrhoids were found during                            retroflexion. The hemorrhoids were small.                           The exam was otherwise without abnormality. Complications:            No immediate complications. Estimated blood loss:                            Minimal. Estimated Blood Loss:     Estimated blood loss was minimal. Impression:               - The examined portion of the ileum was normal.                           - Patchy mild inflammation was found in the cecum                            and at the ileocecal valve. Biopsied.                           - Diverticulosis in the sigmoid  colon.                           - Internal hemorrhoids.                           - The examination was otherwise normal.                           - No polyps                           - The GI Genius (intelligent endoscopy module),                            computer-aided polyp detection system powered by AI                            was utilized to detect colorectal polyps through                            enhanced visualization during  colonoscopy. Recommendation:           - Patient has a contact number available for                            emergencies. The signs and symptoms of potential                            delayed complications were discussed with the                            patient. Return to normal activities tomorrow.                            Written discharge instructions were provided to the                            patient.                           - Resume previous diet.                           - Continue present medications.                           - Await pathology results. Remo Lipps P. Havery Moros, MD 11/17/2022 2:24:20 PM This report has been signed electronically.

## 2022-11-17 NOTE — Progress Notes (Signed)
Pt resting comfortably. VSS. Airway intact. SBAR complete to RN. All questions answered.   

## 2022-11-17 NOTE — Progress Notes (Signed)
[facility] History and Physical   Primary Care Physician:  Girtha Rm, NP-C   Reason for Procedure:   Colon cancer screening  Plan:    colonoscopy     HPI: Ashley Davis is a 57 y.o. female  here for colonoscopy screening - first time exam.   Patient denies any bowel symptoms at this time. No family history of colon cancer known. Otherwise feels well without any cardiopulmonary symptoms.   I have discussed risks / benefits of anesthesia and endoscopic procedure with Amea Alvin Critchley and they wish to proceed with the exams as outlined today.    Past Medical History:  Diagnosis Date   Allergy    Anemia    Anxiety    Arthritis    Asthma    Depression    GERD (gastroesophageal reflux disease)    History of bronchitis    Hyperlipidemia    Hypertension    Pt states her HTN is due to her migraines   Migraine    Tonsil stone 04/25/2019    Past Surgical History:  Procedure Laterality Date   BREAST BIOPSY     COLONOSCOPY  11/17/2022   IR ANGIOGRAM PELVIS SELECTIVE OR SUPRASELECTIVE  04/07/2017   IR ANGIOGRAM PELVIS SELECTIVE OR SUPRASELECTIVE  04/07/2017   IR ANGIOGRAM SELECTIVE EACH ADDITIONAL VESSEL  04/07/2017   IR ANGIOGRAM SELECTIVE EACH ADDITIONAL VESSEL  04/07/2017   IR ANGIOGRAM VISCERAL SELECTIVE  04/07/2017   IR EMBO TUMOR ORGAN ISCHEMIA INFARCT INC GUIDE ROADMAPPING  04/07/2017   IR RADIOLOGIST EVAL & MGMT  01/01/2017   IR RADIOLOGIST EVAL & MGMT  04/30/2017   IR US GUIDE VASC ACCESS RIGHT  04/07/2017    Prior to Admission medications   Medication Sig Start Date End Date Taking? Authorizing Provider  aspirin EC 81 MG tablet Take 81 mg by mouth daily. Swallow whole.   Yes [provider]  loratadine (CLARITIN) 10 MG tablet Take 1 tablet (10 mg total) by mouth daily. 10/14/21  Yes   olmesartan (BENICAR) 40 MG tablet Take 1 tablet (40 mg total) by mouth daily. 08/19/22  Yes Henson, Vickie L, NP-C  ondansetron (ZOFRAN) 4  MG tablet Take 1 tablet (4 mg total) by mouth daily. 10/21/22  Yes   OVER THE COUNTER MEDICATION Pro biotic once daily.   Yes [provider]  OVER THE COUNTER MEDICATION Magnesium two capsules daily.   Yes [provider]  OVER THE COUNTER MEDICATION Vitamin D 3, one capsule daily.   Yes [provider]  OVER THE COUNTER MEDICATION Calcium 600 mg. One capsule daily.   Yes [provider]  progesterone (PROMETRIUM) 100 MG capsule Take 1 capsule (100 mg total) by mouth at bedtime. 09/16/22  Yes   aspirin-acetaminophen-caffeine (EXCEDRIN MIGRAINE) 250-250-65 MG tablet Take 1-3 tablets by mouth every 6 (six) hours as needed for headache or migraine.    [provider]  estradiol (VIVELLE-DOT) 0.0375 MG/24HR Place 1 patch onto the skin 2 (two) times a week. 09/18/22     fluticasone (FLONASE) 50 MCG/ACT nasal spray Place 2 sprays into both nostrils daily until directed to stop. 08/19/22   Henson, Vickie L, NP-C  meloxicam (MOBIC) 15 MG tablet Take 1 tablet (15 mg total) by mouth daily. 10/02/22   Henson, Vickie L, NP-C  OVER THE COUNTER MEDICATION B Complex vitiman once daily.    [provider]  Semaglutide-Weight Management (WEGOVY) 1 MG/0.5ML SOAJ Inject 1 mg into the skin every 7 (seven) days.  10/21/22       Current Outpatient Medications  Medication Sig Dispense Refill   aspirin EC 81 MG tablet Take 81 mg by mouth daily. Swallow whole.     loratadine (CLARITIN) 10 MG tablet Take 1 tablet (10 mg total) by mouth daily. 90 tablet 0   olmesartan (BENICAR) 40 MG tablet Take 1 tablet (40 mg total) by mouth daily. 90 tablet 1   ondansetron (ZOFRAN) 4 MG tablet Take 1 tablet (4 mg total) by mouth daily. 30 tablet 0   OVER THE COUNTER MEDICATION Pro biotic once daily.     OVER THE COUNTER MEDICATION Magnesium two capsules daily.     OVER THE COUNTER MEDICATION Vitamin D 3, one capsule daily.     OVER THE COUNTER MEDICATION Calcium 600 mg. One capsule  daily.     progesterone (PROMETRIUM) 100 MG capsule Take 1 capsule (100 mg total) by mouth at bedtime. 30 capsule 1   aspirin-acetaminophen-caffeine (EXCEDRIN MIGRAINE) 250-250-65 MG tablet Take 1-3 tablets by mouth every 6 (six) hours as needed for headache or migraine.     estradiol (VIVELLE-DOT) 0.0375 MG/24HR Place 1 patch onto the skin 2 (two) times a week. 8 patch 1   fluticasone (FLONASE) 50 MCG/ACT nasal spray Place 2 sprays into both nostrils daily until directed to stop. 48 g 3   meloxicam (MOBIC) 15 MG tablet Take 1 tablet (15 mg total) by mouth daily. 30 tablet 0   OVER THE COUNTER MEDICATION B Complex vitiman once daily.     Semaglutide-Weight Management (WEGOVY) 1 MG/0.5ML SOAJ Inject 1 mg into the skin every 7 (seven) days. 2 mL 0   Current Facility-Administered Medications  Medication Dose Route Frequency Provider Last Rate Last Admin   0.9 %  sodium chloride infusion  500 mL Intravenous Once Tarnisha Kachmar, Carlota Raspberry, MD        Allergies as of 11/17/2022 - Review Complete 11/17/2022  Allergen Reaction Noted   Augmentin [amoxicillin-pot clavulanate] Palpitations 09/20/2016   Shellfish allergy Hives 04/24/2014    Family History  Problem Relation Age of Onset   Diabetes Mother    Stroke Mother    Cancer Mother    Headache Mother    Diabetes Father    Other Father        neuropathy   Headache Sister    Aneurysm Sister        3 brain surgeries   Colon cancer Neg Hx    Esophageal cancer Neg Hx    Rectal cancer Neg Hx    Stomach cancer Neg Hx     Social History   Socioeconomic History   Marital status: Married    Spouse name: Darren   Number of children: 2   Years of education: Secretary/administrator   Highest education level: Not on file  Occupational History    Comment: Med Tech, [employer]  Tobacco Use   Smoking status: Former    Packs/day: 0.25    Years: 30.00    Additional pack years: 0.00    Total pack years: 7.50    Types: Cigarettes    Quit date: 04/06/2017     Years since quitting: 5.6   Smokeless tobacco: Never  Vaping Use   Vaping Use: Never used  Substance and Sexual Activity   Alcohol use: Yes    Comment: wine 2-3 x month   Drug use: No   Sexual activity: Yes    Birth control/protection: Post-menopausal  Other Topics Concern   Not on file  Social History Narrative   Patient lives at home with family.   Caffeine Use: 1 cup 2-3 times weekly   Social Determinants of Health   Financial Resource Strain: Not on file  Food Insecurity: Not on file  Transportation Needs: Not on file  Physical Activity: Not on file  Stress: Not on file  Social Connections: Not on file  Intimate Partner Violence: Not on file    Review of Systems: All other review of systems negative except as mentioned in the HPI.  Physical Exam: Vital signs BP (!) 108/52   Pulse 74   Temp (!) 96.8 F (36 C) (Temporal)   Ht 5\' 11"  (1.803 m)   Wt (!) 301 lb (136.5 kg)   LMP 03/31/2017 (Exact Date) Comment: neg preg test 04/07/17  SpO2 98%   BMI 41.98 kg/m   General:   Alert,  Well-developed, pleasant and cooperative in NAD Lungs:  Clear throughout to auscultation.   Heart:  Regular rate and rhythm Abdomen:  Soft, nontender and nondistended.   Neuro/Psych:  Alert and cooperative. Normal mood and affect. A and O x 3  Jolly Mango, MD [facility]

## 2022-11-17 NOTE — Patient Instructions (Addendum)
Recommendation:- Patient has a contact number available for                            emergencies. The signs and symptoms of potential                            delayed complications were discussed with the                            patient. Return to normal activities tomorrow.                            Written discharge instructions were provided to the                            patient.                           - Resume previous diet.                           - Continue present medications.                           - Await pathology results.  Handouts on diverticulosis and hemorrhoids given.  YOU HAD AN ENDOSCOPIC PROCEDURE TODAY AT [facility]:   Refer to the procedure report that was given to you for any specific questions about what was found during the examination.  If the procedure report does not answer your questions, please call your gastroenterologist to clarify.  If you requested that your care partner not be given the details of your procedure findings, then the procedure report has been included in a sealed envelope for you to review at your convenience later.  YOU SHOULD EXPECT: Some feelings of bloating in the abdomen. Passage of more gas than usual.  Walking can help get rid of the air that was put into your GI tract during the procedure and reduce the bloating. If you had a lower endoscopy (such as a colonoscopy or flexible sigmoidoscopy) you may notice spotting of blood in your stool or on the toilet paper. If you underwent a bowel prep for your procedure, you may not have a normal bowel movement for a few days.  Please Note:  You might notice some irritation and congestion in your nose or some drainage.  This is from the oxygen used during your procedure.  There is no need for concern and it should clear up in a day or so.  SYMPTOMS TO REPORT IMMEDIATELY:  Following lower endoscopy (colonoscopy or flexible sigmoidoscopy):  Excessive amounts of blood  in the stool  Significant tenderness or worsening of abdominal pains  Swelling of the abdomen that is new, acute  Fever of 100F or higher  For urgent or emergent issues, a gastroenterologist can be reached at any hour by calling [phone]. Do not use MyChart messaging for urgent concerns.    DIET:  We do recommend a small meal at first, but then you may proceed to your regular diet.  Drink plenty of fluids but you should avoid alcoholic beverages for 24 hours.  ACTIVITY:  You should plan to  take it easy for the rest of today and you should NOT DRIVE or use heavy machinery until tomorrow (because of the sedation medicines used during the test).    FOLLOW UP: Our staff will call the number listed on your records the next business day following your procedure.  We will call around 7:15- 8:00 am to check on you and address any questions or concerns that you may have regarding the information given to you following your procedure. If we do not reach you, we will leave a message.     If any biopsies were taken you will be contacted by phone or by letter within the next 1-3 weeks.  Please call us at [phone] if you have not heard about the biopsies in 3 weeks.    SIGNATURES/CONFIDENTIALITY: You and/or your care partner have signed paperwork which will be entered into your electronic medical record.  These signatures attest to the fact that that the information above on your After Visit Summary has been reviewed and is understood.  Full responsibility of the confidentiality of this discharge information lies with you and/or your care-partner.

## 2022-11-17 NOTE — Progress Notes (Signed)
Pt's states no medical or surgical changes since previsit or office visit. 

## 2022-11-18 ENCOUNTER — Telehealth: Payer: Self-pay

## 2022-11-18 ENCOUNTER — Ambulatory Visit: Payer: Commercial Managed Care - PPO | Admitting: Family Medicine

## 2022-11-18 NOTE — Telephone Encounter (Signed)
  Follow up Call-     11/17/2022    1:07 PM  Call back number  Post procedure Call Back phone  # [phone]  Permission to leave phone message Yes     Patient questions:  Do you have a fever, pain , or abdominal swelling? No. Pain Score  0 *  Have you tolerated food without any problems? Yes.    Have you been able to return to your normal activities? Yes.    Do you have any questions about your discharge instructions: Diet   No. Medications  No. Follow up visit  No.  Do you have questions or concerns about your Care? No.  Actions: * If pain score is 4 or above: No action needed, pain <4.

## 2022-11-20 ENCOUNTER — Ambulatory Visit: Payer: Commercial Managed Care - PPO | Admitting: Family Medicine

## 2022-11-20 ENCOUNTER — Encounter: Payer: Self-pay | Admitting: Family Medicine

## 2022-11-20 ENCOUNTER — Other Ambulatory Visit: Payer: Self-pay

## 2022-11-20 ENCOUNTER — Other Ambulatory Visit (HOSPITAL_COMMUNITY): Payer: Self-pay

## 2022-11-20 VITALS — BP 112/82 | HR 75 | Temp 97.6°F | Ht 71.0 in | Wt 294.0 lb

## 2022-11-20 DIAGNOSIS — T502X5A Adverse effect of carbonic-anhydrase inhibitors, benzothiadiazides and other diuretics, initial encounter: Secondary | ICD-10-CM | POA: Diagnosis not present

## 2022-11-20 DIAGNOSIS — E876 Hypokalemia: Secondary | ICD-10-CM

## 2022-11-20 DIAGNOSIS — I10 Essential (primary) hypertension: Secondary | ICD-10-CM | POA: Diagnosis not present

## 2022-11-20 LAB — BASIC METABOLIC PANEL
BUN: 11 mg/dL (ref 6–23)
CO2: 28 mEq/L (ref 19–32)
Calcium: 9.3 mg/dL (ref 8.4–10.5)
Chloride: 105 mEq/L (ref 96–112)
Creatinine, Ser: 0.64 mg/dL (ref 0.40–1.20)
GFR: 99.39 mL/min (ref >60.00)
Glucose, Bld: 97 mg/dL (ref 70–99)
Potassium: 3.4 mEq/L — ABNORMAL LOW (ref 3.5–5.1)
Sodium: 140 mEq/L (ref 135–145)

## 2022-11-20 MED ORDER — POTASSIUM CHLORIDE CRYS ER 10 MEQ PO TBCR
10.0000 meq | EXTENDED_RELEASE_TABLET | Freq: Every day | ORAL | 2 refills | Status: DC
  Filled 2022-11-20: qty 30, 30d supply, fill #0
  Filled 2022-12-15: qty 30, 30d supply, fill #1
  Filled 2023-01-15: qty 30, 30d supply, fill #2

## 2022-11-20 MED ORDER — OLMESARTAN MEDOXOMIL-HCTZ 40-12.5 MG PO TABS
1.0000 | ORAL_TABLET | Freq: Every day | ORAL | 1 refills | Status: DC
  Filled 2022-11-20: qty 90, 90d supply, fill #0
  Filled 2023-02-12: qty 90, 90d supply, fill #1

## 2022-11-20 NOTE — Progress Notes (Signed)
Subjective:     Patient ID: Ashley Davis, female    DOB: December 13, 1965, 57 y.o.   MRN: [id number]  Chief Complaint  Patient presents with   Hypertension    3 month f/u, requesting bloodwork. Stopped the DYAZIDE due to allergic reaction but has been taking benicar as well as leftover olmesartan-HCTZ combination. Checking BP at home and reports it has been doing well, 108/60 something monday    Hypertension Pertinent negatives include no chest pain, palpitations or shortness of breath.   Patient is in today for follow up on HTN. States she had facial swelling with Dyazide.  She started back on olmesartan- HCTZ 40-12.5 mg daily    K-dur 20 MEQ 3 days per week.   Pap smear done - Dr. [facility]  Colonoscopy done   States she went to [facility]  Estrogen patch low dose  Progesterone at bedtime   Hot flashes have improved.   Health Maintenance Due  Topic Date Due   Zoster Vaccines- Shingrix (1 of 2) Never done   MAMMOGRAM  10/16/2020   PAP SMEAR-Modifier  09/17/2021    Past Medical History:  Diagnosis Date   Allergy    Anemia    Anxiety    Arthritis    Asthma    Depression    GERD (gastroesophageal reflux disease)    History of bronchitis    Hyperlipidemia    Hypertension    Pt states her HTN is due to her migraines   Migraine    Tonsil stone 04/25/2019    Past Surgical History:  Procedure Laterality Date   BREAST BIOPSY     COLONOSCOPY  11/17/2022   IR ANGIOGRAM PELVIS SELECTIVE OR SUPRASELECTIVE  04/07/2017   IR ANGIOGRAM PELVIS SELECTIVE OR SUPRASELECTIVE  04/07/2017   IR ANGIOGRAM SELECTIVE EACH ADDITIONAL VESSEL  04/07/2017   IR ANGIOGRAM SELECTIVE EACH ADDITIONAL VESSEL  04/07/2017   IR ANGIOGRAM VISCERAL SELECTIVE  04/07/2017   IR EMBO TUMOR ORGAN ISCHEMIA INFARCT INC GUIDE ROADMAPPING  04/07/2017   IR RADIOLOGIST EVAL & MGMT  01/01/2017   IR RADIOLOGIST EVAL & MGMT  04/30/2017   IR US GUIDE VASC ACCESS RIGHT  04/07/2017     Family History  Problem Relation Age of Onset   Diabetes Mother    Stroke Mother    Cancer Mother    Headache Mother    Diabetes Father    Other Father        neuropathy   Headache Sister    Aneurysm Sister        3 brain surgeries   Colon cancer Neg Hx    Esophageal cancer Neg Hx    Rectal cancer Neg Hx    Stomach cancer Neg Hx     Social History   Socioeconomic History   Marital status: Married    Spouse name: Darren   Number of children: 2   Years of education: Engineer, agricultural education level: Some college, no degree  Occupational History    Comment: Med Tech, [employer]  Tobacco Use   Smoking status: Former    Packs/day: 0.25    Years: 30.00    Additional pack years: 0.00    Total pack years: 7.50    Types: Cigarettes    Quit date: 04/06/2017    Years since quitting: 5.6   Smokeless tobacco: Never  Vaping Use   Vaping Use: Never used  Substance and Sexual Activity   Alcohol use: Yes  Comment: wine 2-3 x month   Drug use: No   Sexual activity: Yes    Birth control/protection: Post-menopausal  Other Topics Concern   Not on file  Social History Narrative   Patient lives at home with family.   Caffeine Use: 1 cup 2-3 times weekly   Social Determinants of Health   Financial Resource Strain: Low Risk  (11/19/2022)   Overall Financial Resource Strain (CARDIA)    Difficulty of Paying Living Expenses: Not very hard  Food Insecurity: No Food Insecurity (11/19/2022)   Hunger Vital Sign    Worried About Running Out of Food in the Last Year: Never true    Ran Out of Food in the Last Year: Never true  Transportation Needs: No Transportation Needs (11/19/2022)   PRAPARE - Hydrologist (Medical): No    Lack of Transportation (Non-Medical): No  Physical Activity: Insufficiently Active (11/19/2022)   Exercise Vital Sign    Days of Exercise per Week: 3 days    Minutes of Exercise per Session: 30 min  Stress: No Stress Concern  Present (11/19/2022)   Bartholomew    Feeling of Stress : Not at all  Social Connections: Unknown (11/19/2022)   Social Connection and Isolation Panel [NHANES]    Frequency of Communication with Friends and Family: More than three times a week    Frequency of Social Gatherings with Friends and Family: Three times a week    Attends Religious Services: Not on file    Active Member of Clubs or Organizations: Yes    Attends Archivist Meetings: More than 4 times per year    Marital Status: Married  Human resources officer Violence: Not on file    Outpatient Medications Prior to Visit  Medication Sig Dispense Refill   aspirin EC 81 MG tablet Take 81 mg by mouth daily. Swallow whole.     aspirin-acetaminophen-caffeine (EXCEDRIN MIGRAINE) 250-250-65 MG tablet Take 1-3 tablets by mouth every 6 (six) hours as needed for headache or migraine.     estradiol (VIVELLE-DOT) 0.0375 MG/24HR Place 1 patch onto the skin 2 (two) times a week. 8 patch 1   fluticasone (FLONASE) 50 MCG/ACT nasal spray Place 2 sprays into both nostrils daily until directed to stop. 48 g 3   loratadine (CLARITIN) 10 MG tablet Take 1 tablet (10 mg total) by mouth daily. 90 tablet 0   meloxicam (MOBIC) 15 MG tablet Take 1 tablet (15 mg total) by mouth daily. 30 tablet 0   ondansetron (ZOFRAN) 4 MG tablet Take 1 tablet (4 mg total) by mouth daily. 30 tablet 0   OVER THE COUNTER MEDICATION B Complex vitiman once daily.     OVER THE COUNTER MEDICATION Pro biotic once daily.     OVER THE COUNTER MEDICATION Magnesium two capsules daily.     OVER THE COUNTER MEDICATION Vitamin D 3, one capsule daily.     OVER THE COUNTER MEDICATION Calcium 600 mg. One capsule daily.     progesterone (PROMETRIUM) 100 MG capsule Take 1 capsule (100 mg total) by mouth at bedtime. 30 capsule 1   Semaglutide-Weight Management (WEGOVY) 1 MG/0.5ML SOAJ Inject 1 mg into the skin every 7 (seven)  days. 2 mL 0   olmesartan (BENICAR) 40 MG tablet Take 1 tablet (40 mg total) by mouth daily. 90 tablet 1   No facility-administered medications prior to visit.    Allergies  Allergen Reactions   Augmentin [  Amoxicillin-Pot Clavulanate] Palpitations    Has patient had a PCN reaction causing immediate rash, facial/tongue/throat swelling, SOB or lightheadedness with hypotension: yes, Heart palpitations and chest pressure Has patient had a PCN reaction causing severe rash involving mucus membranes or skin necrosis: no Has patient had a PCN reaction that required hospitalization: no Has patient had a PCN reaction occurring within the last 10 years: yes If all of the above answers are "NO", then may proceed with Cephalosporin use.   Shellfish Allergy Hives    Review of Systems  Constitutional:  Negative for chills and fever.  Respiratory:  Negative for shortness of breath.   Cardiovascular:  Negative for chest pain, palpitations and leg swelling.  Gastrointestinal:  Negative for abdominal pain, constipation, diarrhea, nausea and vomiting.  Genitourinary:  Negative for dysuria, frequency and urgency.  Neurological:  Negative for dizziness.       Objective:    Physical Exam Constitutional:      General: She is not in acute distress.    Appearance: She is not ill-appearing.  Eyes:     Extraocular Movements: Extraocular movements intact.     Conjunctiva/sclera: Conjunctivae normal.  Cardiovascular:     Rate and Rhythm: Normal rate.  Pulmonary:     Effort: Pulmonary effort is normal.  Musculoskeletal:     Cervical back: Normal range of motion and neck supple.  Skin:    General: Skin is warm and dry.  Neurological:     General: No focal deficit present.     Mental Status: She is alert and oriented to person, place, and time.  Psychiatric:        Mood and Affect: Mood normal.        Behavior: Behavior normal.        Thought Content: Thought content normal.     BP 112/82 (BP  Location: Left Arm, Patient Position: Sitting, Cuff Size: Large)   Pulse 75   Temp 97.6 F (36.4 C) (Temporal)   Ht 5\' 11"  (1.803 m)   Wt 294 lb (133.4 kg)   LMP 03/31/2017 (Exact Date) Comment: neg preg test 04/07/17  SpO2 97%   BMI 41.00 kg/m  Wt Readings from Last 3 Encounters:  11/20/22 294 lb (133.4 kg)  11/17/22 (!) 301 lb (136.5 kg)  10/27/22 (!) 301 lb (136.5 kg)       Assessment & Plan:   Problem List Items Addressed This Visit       Cardiovascular and Mediastinum   Primary hypertension - Primary   Relevant Medications   olmesartan-hydrochlorothiazide (BENICAR HCT) 40-12.5 MG tablet   Other Relevant Orders   Basic metabolic panel (Completed)     Other   Severe obesity (BMI >= 40) (HCC)   Refilled medication. She stopped Dyazide because she had facial swelling that she linked to this new medication. She did not let me know until today that she switched back to Benicar HCT. She is seeing [facility] for weight loss and HRT.  Check BMP and follow up. Refill potassium  I have discontinued Ashley Davis's olmesartan. I am also having her start on olmesartan-hydrochlorothiazide. Additionally, I am having her maintain her aspirin-acetaminophen-caffeine, loratadine, fluticasone, estradiol, progesterone, meloxicam, ondansetron, Wegovy, OVER THE COUNTER MEDICATION, OVER THE COUNTER MEDICATION, OVER THE COUNTER MEDICATION, OVER THE COUNTER MEDICATION, OVER THE COUNTER MEDICATION, and aspirin EC.  Meds ordered this encounter  Medications   olmesartan-hydrochlorothiazide (BENICAR HCT) 40-12.5 MG tablet    Sig: Take 1 tablet by mouth daily.  Dispense:  90 tablet    Refill:  1    Order Specific Question:   Supervising Provider    Answer:   Pricilla Holm A [id number]

## 2022-11-21 ENCOUNTER — Other Ambulatory Visit: Payer: Self-pay

## 2022-11-25 ENCOUNTER — Other Ambulatory Visit: Payer: Self-pay

## 2022-11-25 ENCOUNTER — Other Ambulatory Visit (HOSPITAL_COMMUNITY): Payer: Self-pay

## 2022-12-02 ENCOUNTER — Other Ambulatory Visit (HOSPITAL_COMMUNITY): Payer: Self-pay

## 2022-12-02 DIAGNOSIS — Z6841 Body Mass Index (BMI) 40.0 and over, adult: Secondary | ICD-10-CM | POA: Diagnosis not present

## 2022-12-02 DIAGNOSIS — E7800 Pure hypercholesterolemia, unspecified: Secondary | ICD-10-CM | POA: Diagnosis not present

## 2022-12-03 ENCOUNTER — Other Ambulatory Visit: Payer: Self-pay

## 2022-12-03 ENCOUNTER — Other Ambulatory Visit (HOSPITAL_COMMUNITY): Payer: Self-pay

## 2022-12-03 MED ORDER — ESTRADIOL 0.0375 MG/24HR TD PTTW
1.0000 | MEDICATED_PATCH | TRANSDERMAL | 3 refills | Status: DC
  Filled 2022-12-03: qty 8, 28d supply, fill #0
  Filled 2022-12-27: qty 8, 28d supply, fill #1
  Filled 2023-01-23: qty 8, 28d supply, fill #2
  Filled 2023-02-26: qty 8, 28d supply, fill #3

## 2022-12-03 MED ORDER — WEGOVY 1 MG/0.5ML ~~LOC~~ SOAJ
1.0000 mg | SUBCUTANEOUS | 1 refills | Status: DC
  Filled 2022-12-03 – 2022-12-10 (×3): qty 2, 28d supply, fill #0
  Filled 2022-12-15: qty 2, fill #0
  Filled 2022-12-16 – 2022-12-18 (×2): qty 2, 28d supply, fill #0
  Filled 2023-01-10 – 2023-04-19 (×7): qty 2, 28d supply, fill #1

## 2022-12-03 MED ORDER — PROGESTERONE MICRONIZED 100 MG PO CAPS
100.0000 mg | ORAL_CAPSULE | Freq: Every day | ORAL | 3 refills | Status: DC
  Filled 2022-12-03: qty 30, 30d supply, fill #0
  Filled 2022-12-27: qty 30, 30d supply, fill #1
  Filled 2023-01-29: qty 30, 30d supply, fill #2
  Filled 2023-02-26: qty 30, 30d supply, fill #3

## 2022-12-05 ENCOUNTER — Other Ambulatory Visit: Payer: Self-pay

## 2022-12-05 ENCOUNTER — Other Ambulatory Visit (HOSPITAL_COMMUNITY): Payer: Self-pay

## 2022-12-10 ENCOUNTER — Other Ambulatory Visit (HOSPITAL_COMMUNITY): Payer: Self-pay

## 2022-12-10 ENCOUNTER — Other Ambulatory Visit: Payer: Self-pay

## 2022-12-11 ENCOUNTER — Other Ambulatory Visit: Payer: Self-pay

## 2022-12-16 ENCOUNTER — Other Ambulatory Visit (HOSPITAL_COMMUNITY): Payer: Self-pay

## 2022-12-16 ENCOUNTER — Other Ambulatory Visit: Payer: Self-pay

## 2022-12-18 ENCOUNTER — Other Ambulatory Visit: Payer: Self-pay

## 2022-12-18 ENCOUNTER — Other Ambulatory Visit (HOSPITAL_COMMUNITY): Payer: Self-pay

## 2022-12-27 ENCOUNTER — Other Ambulatory Visit (HOSPITAL_COMMUNITY): Payer: Self-pay

## 2023-01-12 ENCOUNTER — Other Ambulatory Visit: Payer: Self-pay

## 2023-01-16 ENCOUNTER — Other Ambulatory Visit: Payer: Self-pay

## 2023-01-23 ENCOUNTER — Other Ambulatory Visit: Payer: Self-pay

## 2023-01-29 ENCOUNTER — Other Ambulatory Visit: Payer: Self-pay

## 2023-01-29 ENCOUNTER — Other Ambulatory Visit (HOSPITAL_COMMUNITY): Payer: Self-pay

## 2023-02-12 ENCOUNTER — Other Ambulatory Visit: Payer: Self-pay

## 2023-02-12 ENCOUNTER — Other Ambulatory Visit: Payer: Self-pay | Admitting: Family Medicine

## 2023-02-12 ENCOUNTER — Other Ambulatory Visit (HOSPITAL_COMMUNITY): Payer: Self-pay

## 2023-02-12 DIAGNOSIS — T502X5A Adverse effect of carbonic-anhydrase inhibitors, benzothiadiazides and other diuretics, initial encounter: Secondary | ICD-10-CM

## 2023-02-12 MED ORDER — POTASSIUM CHLORIDE CRYS ER 10 MEQ PO TBCR
10.0000 meq | EXTENDED_RELEASE_TABLET | Freq: Every day | ORAL | 2 refills | Status: DC
Start: 2023-02-12 — End: 2023-06-12
  Filled 2023-02-12: qty 30, 30d supply, fill #0
  Filled 2023-03-16: qty 30, 30d supply, fill #1
  Filled 2023-04-19: qty 30, 30d supply, fill #2

## 2023-02-12 NOTE — Telephone Encounter (Signed)
Do you want pt to continue this? Supposed to f/u in Sept

## 2023-02-26 ENCOUNTER — Other Ambulatory Visit: Payer: Self-pay

## 2023-02-26 ENCOUNTER — Other Ambulatory Visit (HOSPITAL_COMMUNITY): Payer: Self-pay

## 2023-03-16 ENCOUNTER — Other Ambulatory Visit (HOSPITAL_COMMUNITY): Payer: Self-pay

## 2023-03-16 ENCOUNTER — Other Ambulatory Visit: Payer: Self-pay

## 2023-03-25 ENCOUNTER — Other Ambulatory Visit (HOSPITAL_COMMUNITY): Payer: Self-pay

## 2023-03-26 ENCOUNTER — Other Ambulatory Visit (HOSPITAL_COMMUNITY): Payer: Self-pay

## 2023-03-26 ENCOUNTER — Other Ambulatory Visit: Payer: Self-pay

## 2023-03-26 MED ORDER — PROGESTERONE MICRONIZED 100 MG PO CAPS
100.0000 mg | ORAL_CAPSULE | Freq: Every day | ORAL | 3 refills | Status: DC
  Filled 2023-03-26: qty 30, 30d supply, fill #0
  Filled 2023-06-02: qty 30, 30d supply, fill #1

## 2023-03-26 MED ORDER — ESTRADIOL 0.0375 MG/24HR TD PTTW
1.0000 | MEDICATED_PATCH | TRANSDERMAL | 3 refills | Status: DC
  Filled 2023-03-26: qty 8, 28d supply, fill #0
  Filled 2023-04-19: qty 8, 28d supply, fill #1
  Filled 2023-06-02: qty 8, 28d supply, fill #2

## 2023-03-31 ENCOUNTER — Other Ambulatory Visit: Payer: Self-pay

## 2023-04-16 ENCOUNTER — Encounter (INDEPENDENT_AMBULATORY_CARE_PROVIDER_SITE_OTHER): Payer: Self-pay

## 2023-04-20 ENCOUNTER — Other Ambulatory Visit: Payer: Self-pay

## 2023-05-14 DIAGNOSIS — H524 Presbyopia: Secondary | ICD-10-CM | POA: Diagnosis not present

## 2023-05-26 ENCOUNTER — Ambulatory Visit: Payer: Commercial Managed Care - PPO | Admitting: Family Medicine

## 2023-06-02 ENCOUNTER — Other Ambulatory Visit: Payer: Self-pay

## 2023-06-10 DIAGNOSIS — N950 Postmenopausal bleeding: Secondary | ICD-10-CM | POA: Diagnosis not present

## 2023-06-12 ENCOUNTER — Encounter: Payer: Self-pay | Admitting: Family Medicine

## 2023-06-12 ENCOUNTER — Other Ambulatory Visit: Payer: Self-pay | Admitting: Family Medicine

## 2023-06-12 ENCOUNTER — Ambulatory Visit: Payer: Commercial Managed Care - PPO | Admitting: Family Medicine

## 2023-06-12 VITALS — BP 118/82 | HR 72 | Temp 98.3°F | Ht 71.0 in | Wt 290.2 lb

## 2023-06-12 DIAGNOSIS — I10 Essential (primary) hypertension: Secondary | ICD-10-CM

## 2023-06-12 DIAGNOSIS — E876 Hypokalemia: Secondary | ICD-10-CM | POA: Diagnosis not present

## 2023-06-12 DIAGNOSIS — E782 Mixed hyperlipidemia: Secondary | ICD-10-CM | POA: Diagnosis not present

## 2023-06-12 DIAGNOSIS — T502X5A Adverse effect of carbonic-anhydrase inhibitors, benzothiadiazides and other diuretics, initial encounter: Secondary | ICD-10-CM | POA: Diagnosis not present

## 2023-06-12 DIAGNOSIS — E559 Vitamin D deficiency, unspecified: Secondary | ICD-10-CM

## 2023-06-12 DIAGNOSIS — Z23 Encounter for immunization: Secondary | ICD-10-CM | POA: Diagnosis not present

## 2023-06-12 DIAGNOSIS — Z2989 Encounter for other specified prophylactic measures: Secondary | ICD-10-CM | POA: Insufficient documentation

## 2023-06-12 LAB — COMPREHENSIVE METABOLIC PANEL
ALT: 10 U/L (ref 0–35)
AST: 13 U/L (ref 0–37)
Albumin: 3.8 g/dL (ref 3.5–5.2)
Alkaline Phosphatase: 79 U/L (ref 39–117)
BUN: 9 mg/dL (ref 6–23)
CO2: 27 meq/L (ref 19–32)
Calcium: 9.1 mg/dL (ref 8.4–10.5)
Chloride: 106 meq/L (ref 96–112)
Creatinine, Ser: 0.61 mg/dL (ref 0.40–1.20)
GFR: 100.15 mL/min (ref >60.00)
Glucose, Bld: 103 mg/dL — ABNORMAL HIGH (ref 70–99)
Potassium: 3.5 meq/L (ref 3.5–5.1)
Sodium: 140 meq/L (ref 135–145)
Total Bilirubin: 0.5 mg/dL (ref 0.2–1.2)
Total Protein: 6.7 g/dL (ref 6.0–8.3)

## 2023-06-12 LAB — CBC WITH DIFFERENTIAL/PLATELET
Basophils Absolute: 0 10*3/uL (ref 0.0–0.1)
Basophils Relative: 0.8 % (ref 0.0–3.0)
Eosinophils Absolute: 0.2 10*3/uL (ref 0.0–0.7)
Eosinophils Relative: 4.7 % (ref 0.0–5.0)
HCT: 36.6 % (ref 36.0–46.0)
Hemoglobin: 11.8 g/dL — ABNORMAL LOW (ref 12.0–15.0)
Lymphocytes Relative: 34.5 % (ref 12.0–46.0)
Lymphs Abs: 1.6 10*3/uL (ref 0.7–4.0)
MCHC: 32.1 g/dL (ref 30.0–36.0)
MCV: 92.7 fL (ref 78.0–100.0)
Monocytes Absolute: 0.4 10*3/uL (ref 0.1–1.0)
Monocytes Relative: 8.5 % (ref 3.0–12.0)
Neutro Abs: 2.4 10*3/uL (ref 1.4–7.7)
Neutrophils Relative %: 51.5 % (ref 43.0–77.0)
Platelets: 264 10*3/uL (ref 150.0–400.0)
RBC: 3.95 Mil/uL (ref 3.87–5.11)
RDW: 13.6 % (ref 11.5–15.5)
WBC: 4.6 10*3/uL (ref 4.0–10.5)

## 2023-06-12 LAB — VITAMIN D 25 HYDROXY (VIT D DEFICIENCY, FRACTURES): VITD: 32.86 ng/mL (ref 30.00–100.00)

## 2023-06-12 LAB — LIPID PANEL
Cholesterol: 154 mg/dL (ref 0–200)
HDL: 31.6 mg/dL — ABNORMAL LOW (ref >39.00)
LDL Cholesterol: 113 mg/dL — ABNORMAL HIGH (ref 0–99)
NonHDL: 121.96
Total CHOL/HDL Ratio: 5
Triglycerides: 47 mg/dL (ref 0.0–149.0)
VLDL: 9.4 mg/dL (ref 0.0–40.0)

## 2023-06-12 LAB — TSH: TSH: 1.83 u[IU]/mL (ref 0.35–5.50)

## 2023-06-12 LAB — HEMOGLOBIN A1C: Hgb A1c MFr Bld: 4.9 % (ref 4.6–6.5)

## 2023-06-12 LAB — T4, FREE: Free T4: 1.07 ng/dL (ref 0.60–1.60)

## 2023-06-12 MED ORDER — ATOVAQUONE-PROGUANIL HCL 250-100 MG PO TABS
1.0000 | ORAL_TABLET | Freq: Every day | ORAL | 0 refills | Status: DC
Start: 2023-06-12 — End: 2023-08-11
  Filled 2023-06-12: qty 44, 44d supply, fill #0

## 2023-06-12 NOTE — Assessment & Plan Note (Signed)
Continue healthy diet for weight loss. She is doing a good job since stopping [employer].

## 2023-06-12 NOTE — Assessment & Plan Note (Signed)
Upcoming travel. Discussed how to take Malarone.

## 2023-06-12 NOTE — Assessment & Plan Note (Signed)
Check potassium

## 2023-06-12 NOTE — Assessment & Plan Note (Signed)
Continue over-the-counter vitamin D supplement. Check vitamin D level

## 2023-06-12 NOTE — Progress Notes (Signed)
Subjective:     Patient ID: Ashley Davis, female    DOB: May 05, 1966, 57 y.o.   MRN: [id number]  Chief Complaint  Patient presents with   Medical Management of Chronic Issues    6 month f/u    HPI   History of Present Illness          Here for a 6 month follow up.   Stopped Wegovy in April and is still losing weight.   HTN Hypokalemia   Dr. Im[facility]   She saw her gynecologist 2 days ago for vaginal bleeding.  Will have biopsy soon.   She is traveling to [place] for 34 days starting October 28th. She will go to [place] and [place]     Health Maintenance Due  Topic Date Due   Zoster Vaccines- Shingrix (1 of 2) Never done   MAMMOGRAM  10/16/2020    Past Medical History:  Diagnosis Date   Allergy    Anemia    Anxiety    Arthritis    Asthma    Depression    GERD (gastroesophageal reflux disease)    History of bronchitis    Hyperlipidemia    Hypertension    Pt states her HTN is due to her migraines   Migraine    Tonsil stone 04/25/2019    Past Surgical History:  Procedure Laterality Date   BREAST BIOPSY     COLONOSCOPY  11/17/2022   IR ANGIOGRAM PELVIS SELECTIVE OR SUPRASELECTIVE  04/07/2017   IR ANGIOGRAM PELVIS SELECTIVE OR SUPRASELECTIVE  04/07/2017   IR ANGIOGRAM SELECTIVE EACH ADDITIONAL VESSEL  04/07/2017   IR ANGIOGRAM SELECTIVE EACH ADDITIONAL VESSEL  04/07/2017   IR ANGIOGRAM VISCERAL SELECTIVE  04/07/2017   IR EMBO TUMOR ORGAN ISCHEMIA INFARCT INC GUIDE ROADMAPPING  04/07/2017   IR RADIOLOGIST EVAL & MGMT  01/01/2017   IR RADIOLOGIST EVAL & MGMT  04/30/2017   IR US GUIDE VASC ACCESS RIGHT  04/07/2017    Family History  Problem Relation Age of Onset   Diabetes Mother    Stroke Mother    Cancer Mother    Headache Mother    Diabetes Father    Other Father        neuropathy   Headache Sister    Aneurysm Sister        3 brain surgeries   Colon cancer Neg Hx    Esophageal cancer Neg Hx    Rectal cancer Neg  Hx    Stomach cancer Neg Hx     Social History   Socioeconomic History   Marital status: Married    Spouse name: Darren   Number of children: 2   Years of education: Nature conservation officer education level: Some college, no degree  Occupational History    Comment: Med Tech, [employer]  Tobacco Use   Smoking status: Former    Current packs/day: 0.00    Average packs/day: 0.3 packs/day for 30.0 years (7.5 ttl pk-yrs)    Types: Cigarettes    Start date: 04/07/1987    Quit date: 04/06/2017    Years since quitting: 6.1   Smokeless tobacco: Never  Vaping Use   Vaping status: Never Used  Substance and Sexual Activity   Alcohol use: Yes    Comment: wine 2-3 x month   Drug use: No   Sexual activity: Yes    Birth control/protection: Post-menopausal  Other Topics Concern   Not on file  Social History Narrative   Patient  lives at home with family.   Caffeine Use: 1 cup 2-3 times weekly   Social Determinants of Health   Financial Resource Strain: Low Risk  (11/19/2022)   Overall Financial Resource Strain (CARDIA)    Difficulty of Paying Living Expenses: Not very hard  Food Insecurity: No Food Insecurity (11/19/2022)   Hunger Vital Sign    Worried About Running Out of Food in the Last Year: Never true    Ran Out of Food in the Last Year: Never true  Transportation Needs: No Transportation Needs (11/19/2022)   PRAPARE - Administrator, Civil Service (Medical): No    Lack of Transportation (Non-Medical): No  Physical Activity: Insufficiently Active (11/19/2022)   Exercise Vital Sign    Days of Exercise per Week: 3 days    Minutes of Exercise per Session: 30 min  Stress: No Stress Concern Present (11/19/2022)   [employer] of Occupational Health - Occupational Stress Questionnaire    Feeling of Stress : Not at all  Social Connections: Unknown (11/19/2022)   Social Connection and Isolation Panel [NHANES]    Frequency of Communication with Friends and Family: More than three  times a week    Frequency of Social Gatherings with Friends and Family: Three times a week    Attends Religious Services: Not on file    Active Member of Clubs or Organizations: Yes    Attends Banker Meetings: More than 4 times per year    Marital Status: Married  Catering manager Violence: Not on file    Outpatient Medications Prior to Visit  Medication Sig Dispense Refill   aspirin EC 81 MG tablet Take 81 mg by mouth daily. Swallow whole.     aspirin-acetaminophen-caffeine (EXCEDRIN MIGRAINE) 250-250-65 MG tablet Take 1-3 tablets by mouth every 6 (six) hours as needed for headache or migraine.     estradiol (VIVELLE-DOT) 0.0375 MG/24HR Place 1 patch onto the skin 2 (two) times a week as directed. 8 patch 3   fluticasone (FLONASE) 50 MCG/ACT nasal spray Place 2 sprays into both nostrils daily until directed to stop. 48 g 3   loratadine (CLARITIN) 10 MG tablet Take 1 tablet (10 mg total) by mouth daily. 90 tablet 0   meloxicam (MOBIC) 15 MG tablet Take 1 tablet (15 mg total) by mouth daily. 30 tablet 0   olmesartan-hydrochlorothiazide (BENICAR HCT) 40-12.5 MG tablet Take 1 tablet by mouth daily. 90 tablet 1   OVER THE COUNTER MEDICATION B Complex vitiman once daily.     OVER THE COUNTER MEDICATION Pro biotic once daily.     OVER THE COUNTER MEDICATION Magnesium two capsules daily.     OVER THE COUNTER MEDICATION Vitamin D 3, one capsule daily.     OVER THE COUNTER MEDICATION Calcium 600 mg. One capsule daily.     potassium chloride (KLOR-CON M) 10 MEQ tablet Take 1 tablet (10 mEq total) by mouth daily. 30 tablet 2   progesterone (PROMETRIUM) 100 MG capsule Take 1 capsule (100 mg total) by mouth at bedtime. 30 capsule 3   ondansetron (ZOFRAN) 4 MG tablet Take 1 tablet (4 mg total) by mouth daily. (Patient not taking: Reported on 06/12/2023) 30 tablet 0   Semaglutide-Weight Management (WEGOVY) 1 MG/0.5ML SOAJ Inject 1 mg into the skin once a week. (Patient not taking: Reported  on 06/12/2023) 2 mL 1   No facility-administered medications prior to visit.    Allergies  Allergen Reactions   Augmentin [Amoxicillin-Pot Clavulanate] Palpitations  Has patient had a PCN reaction causing immediate rash, facial/tongue/throat swelling, SOB or lightheadedness with hypotension: yes, Heart palpitations and chest pressure Has patient had a PCN reaction causing severe rash involving mucus membranes or skin necrosis: no Has patient had a PCN reaction that required hospitalization: no Has patient had a PCN reaction occurring within the last 10 years: yes If all of the above answers are "NO", then may proceed with Cephalosporin use.   Shellfish Allergy Hives    Review of Systems  Constitutional:  Negative for chills and fever.  Respiratory:  Negative for shortness of breath.   Cardiovascular:  Negative for chest pain, palpitations and leg swelling.  Gastrointestinal:  Negative for abdominal pain, constipation, diarrhea, nausea and vomiting.  Genitourinary:  Negative for dysuria, frequency and urgency.  Neurological:  Negative for dizziness.       Objective:    Physical Exam   BP 118/82 (BP Location: Left Arm, Patient Position: Sitting, Cuff Size: Large)   Pulse 72   Temp 98.3 F (36.8 C) (Oral)   Ht 5\' 11"  (1.803 m)   Wt 290 lb 3.2 oz (131.6 kg)   LMP 03/31/2017 (Exact Date) Comment: neg preg test 04/07/17  SpO2 98%   BMI 40.47 kg/m  Wt Readings from Last 3 Encounters:  06/12/23 290 lb 3.2 oz (131.6 kg)  11/20/22 294 lb (133.4 kg)  11/17/22 (!) 301 lb (136.5 kg)       Assessment & Plan:   Problem List Items Addressed This Visit       Cardiovascular and Mediastinum   Primary hypertension - Primary    Controlled. Continue current medications. Check renal function.       Relevant Orders   CBC with Differential/Platelet (Completed)   Comprehensive metabolic panel (Completed)     Other   Diuretic-induced hypokalemia    Check potassium        Relevant Orders   Comprehensive metabolic panel (Completed)   Mixed hyperlipidemia    Check lipid panel and follow up       Relevant Orders   Lipid panel (Completed)   Need for malaria prophylaxis    Upcoming travel. Discussed how to take Malarone.       Relevant Medications   atovaquone-proguanil (MALARONE) 250-100 MG TABS tablet   Severe obesity (BMI >= 40) (HCC)    Continue healthy diet for weight loss. She is doing a good job since stopping [employer].       Relevant Orders   TSH (Completed)   T4, free (Completed)   Hemoglobin A1c (Completed)   Vitamin D deficiency    Continue over-the-counter vitamin D supplement. Check vitamin D level       Relevant Orders   VITAMIN D 25 Hydroxy (Vit-D Deficiency, Fractures) (Completed)   Other Visit Diagnoses     Immunization due       Relevant Orders   Hepatitis A vaccine adult IM (Completed)      Hepatitis A vaccine given. She will not have time to get her 2nd one before her trip to [place].  Advised her of the need and requirement to get a yellow fever vaccine. She may call [facility] or try to get this elsewhere with insurance coverage.  She may also need Typhoid. Tdap UTD.  Consider repeat polio vaccine.   I have discontinued Leronda D. Letizia's ZOXWRU. I am also having her start on atovaquone-proguanil. Additionally, I am having her maintain her aspirin-acetaminophen-caffeine, loratadine, fluticasone, meloxicam, ondansetron, OVER THE COUNTER MEDICATION,  OVER THE COUNTER MEDICATION, OVER THE COUNTER MEDICATION, OVER THE COUNTER MEDICATION, OVER THE COUNTER MEDICATION, aspirin EC, olmesartan-hydrochlorothiazide, potassium chloride, estradiol, and progesterone.  Meds ordered this encounter  Medications   atovaquone-proguanil (MALARONE) 250-100 MG TABS tablet    Sig: Take 1 tablet by mouth daily. Take with food.  Repeat dose if vomiting occurs w/in ; Start 2 days before exposure. Stop 7 days after exposure.     Dispense:  44 tablet    Refill:  0    Order Specific Question:   Supervising Provider    Answer:   Hillard Danker A [[id number]

## 2023-06-12 NOTE — Assessment & Plan Note (Signed)
Controlled. Continue current medications. Check renal function.

## 2023-06-12 NOTE — Assessment & Plan Note (Signed)
Check lipid panel and follow-up 

## 2023-06-15 ENCOUNTER — Other Ambulatory Visit (HOSPITAL_COMMUNITY): Payer: Self-pay

## 2023-06-16 ENCOUNTER — Other Ambulatory Visit (HOSPITAL_COMMUNITY): Payer: Self-pay

## 2023-06-16 ENCOUNTER — Other Ambulatory Visit: Payer: Self-pay

## 2023-06-16 MED ORDER — POTASSIUM CHLORIDE CRYS ER 10 MEQ PO TBCR
10.0000 meq | EXTENDED_RELEASE_TABLET | Freq: Every day | ORAL | 2 refills | Status: DC
  Filled 2023-06-16 – 2023-07-02 (×3): qty 30, 30d supply, fill #0
  Filled 2023-08-06 – 2023-09-18 (×2): qty 30, 30d supply, fill #1
  Filled 2023-10-25: qty 30, 30d supply, fill #2

## 2023-06-16 MED ORDER — OLMESARTAN MEDOXOMIL-HCTZ 40-12.5 MG PO TABS
1.0000 | ORAL_TABLET | Freq: Every day | ORAL | 1 refills | Status: DC
  Filled 2023-06-16 – 2023-07-02 (×3): qty 90, 90d supply, fill #0
  Filled 2023-10-25: qty 90, 90d supply, fill #1

## 2023-06-19 ENCOUNTER — Other Ambulatory Visit: Payer: Self-pay

## 2023-06-25 DIAGNOSIS — N950 Postmenopausal bleeding: Secondary | ICD-10-CM | POA: Diagnosis not present

## 2023-06-25 DIAGNOSIS — N870 Mild cervical dysplasia: Secondary | ICD-10-CM | POA: Diagnosis not present

## 2023-06-26 ENCOUNTER — Other Ambulatory Visit (HOSPITAL_COMMUNITY): Payer: Self-pay

## 2023-06-27 ENCOUNTER — Other Ambulatory Visit (HOSPITAL_COMMUNITY): Payer: Self-pay

## 2023-06-29 ENCOUNTER — Other Ambulatory Visit: Payer: Self-pay

## 2023-07-02 ENCOUNTER — Other Ambulatory Visit: Payer: Self-pay

## 2023-07-02 ENCOUNTER — Other Ambulatory Visit (HOSPITAL_COMMUNITY): Payer: Self-pay

## 2023-07-03 ENCOUNTER — Other Ambulatory Visit (HOSPITAL_COMMUNITY): Payer: Self-pay

## 2023-07-14 DIAGNOSIS — D070 Carcinoma in situ of endometrium: Secondary | ICD-10-CM | POA: Diagnosis not present

## 2023-07-20 ENCOUNTER — Telehealth: Payer: Self-pay | Admitting: *Deleted

## 2023-07-20 NOTE — Telephone Encounter (Signed)
Spoke with Ashley Davis regarding her referral to GYN oncology. She has an appointment scheduled with Dr. Pricilla Holm on 08/13/23 at 10:30. Patient agrees to date and time. She has been provided with office address and location. She is also aware of our mask and visitor policy. Patient verbalized understanding and will call with any questions.

## 2023-07-20 NOTE — Telephone Encounter (Signed)
Pt returned call for new patient appointment.She states she works 12 hour shifts at night. She goes in tomorrow night and will call back on Wednesday to let us know if she can do 12/9.

## 2023-07-20 NOTE — Telephone Encounter (Signed)
LMOM for the patient to call the office back. Patient to be scheduled for a new patient appt with Dr Alvester Morin on 12/2.

## 2023-08-06 ENCOUNTER — Other Ambulatory Visit: Payer: Self-pay

## 2023-08-06 ENCOUNTER — Encounter: Payer: Self-pay | Admitting: Pharmacist

## 2023-08-11 ENCOUNTER — Encounter: Payer: Self-pay | Admitting: Gynecologic Oncology

## 2023-08-11 ENCOUNTER — Other Ambulatory Visit: Payer: Self-pay

## 2023-08-12 ENCOUNTER — Encounter: Payer: Self-pay | Admitting: Gynecologic Oncology

## 2023-08-12 NOTE — Progress Notes (Unsigned)
GYNECOLOGIC ONCOLOGY NEW PATIENT CONSULTATION   Patient Name: Ashley Davis  Patient Age: 57 y.o. Date of Service: 08/13/23 Referring Provider: Dr. Jule Economy  Primary Care Provider: Avanell Shackleton, NP-C Consulting Provider: Eugene Garnet, MD   Assessment/Plan:  Peri versus postmenopausal patient with atypical glandular cells on recent endometrial biopsy.  I spent some time reviewing with the patient and her husband her workup thus far.  She had a normal Pap test at the beginning of this year with no history of abnormal Paps.  After just over a year without postmenopausal bleeding, she had a heavy episode of bleeding this fall which prompted pelvic ultrasound and endometrial sampling.  Her endometrial sampling showed low-grade cervical dysplasia as well as scant benign/inactive endometrium.  There were also a few atypical glands with concern for adenocarcinoma in situ.  It is not clear to me from the pathology report whether pathology is favoring that this is endocervical or endometrial in origin.  P16 positivity can be seen in either case.   I stressed the importance of determining whether atypical cells are endometrial or endocervical in origin and whether recent biopsy findings indicate an invasive process.  Although her normal Pap smear history and family history of endometrial cancer would point towards an endometrial origin, we discussed that endocervical lesions can sometimes missed on routine cervical cancer surveillance.  I offered that an endocervical curettage could be done today.  Depending on these results, we still may need to proceed to the operating room for more endometrial sampling or cervical biopsies.  Also offered proceeding directly to an exam under anesthesia with endocervical curettage, hysteroscopy and additional endometrial sampling, cervical biopsies, possible cold knife cone.  After our discussion, the patient wished to proceed to the OR to have additional  sampling performed.  The risks of surgery were discussed with her and include but not are not limited to bleeding, need for blood transfusion, infection, damage to surrounding structures, uterine perforation, VTE, postoperative pneumonia, stroke, myocardial infarction, and rarely death.  All the patient's questions were answered today.  Perioperative education was performed.  Given her mother's history of endometrial cancer, pending further tissue sampling, patient may benefit from referral to genetics for germline genetic testing.  A copy of this note was sent to the patient's referring provider.   60 minutes of total time was spent for this patient encounter, including preparation, face-to-face counseling with the patient and coordination of care, and documentation of the encounter.  Eugene Garnet, MD  Division of Gynecologic Oncology  Department of Obstetrics and Gynecology  [facility]  ___________________________________________  Chief Complaint: Chief Complaint  Patient presents with   CIN1    History of Present Illness:  Ashley Davis is a 57 y.o. y.o. female who is seen in consultation at the request of Dr. Imogene Burn for an evaluation of atypical glandular cells.  Discussed the use of AI scribe software for clinical note transcription with the patient, who gave verbal consent to proceed.  History of Present Illness   The patient stopped having regular menses age 57, with irregular periods for a year prior, and had been amenorrheic for a year before presenting. In January of the current year, the patient had a normal Pap smear and was advised to undergo an ultrasound and biopsy due to her mother's history of uterine cancer.   In October, the patient experienced heavy uterine bleeding, which lasted for four days and was associated with the passage of clots and  normal menstrual cramping. The patient reported no further episodes of bleeding since then.  The patient also reported feeling significant pressure and pain in the pelvic region before the onset of bleeding.   The patient's mother had a history of stage four endometrial cancer, which was treated with hysterectomy followed by chemotherapy and radiation.  Shortly after being deemed to be in remission her mother's cancer was found to be metastatic. This family history has significantly influenced the patient's perception and fear of her current symptoms.  She also started hormone replacement therapy (HRT) in January 2024 (estrogen patch and micronized progesterone) to manage menopausal symptoms, including hot flashes and irritability. However, she discontinued HRT in October when she was informed that it could cause bleeding.     She endorses increased appetite which she thinks is related to stress with some recent weight gain.  Denies any nausea or emesis.  Reports intermittent constipation.  She denies any urinary symptoms.  Recent work-up Pap test: 09/2022 - [facility] 10/2022: Started HRT with [facility] The patient was seen in October and at that time endorsed 2-3 episodes of postmenopausal bleeding within the last year.   [facility]: Uterus measures 10.35 x 8.92 x 6.6 cm.  Intramural and subserosal calcified fibroid seen, largest measures up to 6.3 cm.  Endometrial lining measures 3 mm.  Ovaries not visualized although no masses seen.  No free fluid. EMB on 06/25/23: Low-grade squamous intraepithelial lesion, CIN-1.  Few atypical glands, suspicious for adenocarcinoma in situ.  Scant benign inactive endometrium.  She has a history of uterine fibroids and underwent uterine artery embolization in 2018.  Prior to that procedure, her uterus measured 15.9 x 8.7 x 13 cm with a 7.6 cm left-sided fundal fibroid, 8.7 cm right uterine body fibroid, and small submucosal fibroid.  Follow-up MRI in 2019 showed interval decrease in uterine size as well as  devascularization of dominant uterine fibroids.  She comes in with her husband today. She works for [employer] at [employer].  PAST MEDICAL HISTORY:  Past Medical History:  Diagnosis Date   Allergy    Anemia    Anxiety    Arthritis    Asthma    Depression    GERD (gastroesophageal reflux disease)    History of bronchitis    Hyperlipidemia    Hypertension    Pt states her HTN is due to her migraines   Migraine    Tonsil stone 04/25/2019     PAST SURGICAL HISTORY:  Past Surgical History:  Procedure Laterality Date   BREAST BIOPSY     COLONOSCOPY  11/17/2022   IR ANGIOGRAM PELVIS SELECTIVE OR SUPRASELECTIVE  04/07/2017   IR ANGIOGRAM PELVIS SELECTIVE OR SUPRASELECTIVE  04/07/2017   IR ANGIOGRAM SELECTIVE EACH ADDITIONAL VESSEL  04/07/2017   IR ANGIOGRAM SELECTIVE EACH ADDITIONAL VESSEL  04/07/2017   IR ANGIOGRAM VISCERAL SELECTIVE  04/07/2017   IR EMBO TUMOR ORGAN ISCHEMIA INFARCT INC GUIDE ROADMAPPING  04/07/2017   IR RADIOLOGIST EVAL & MGMT  01/01/2017   IR RADIOLOGIST EVAL & MGMT  04/30/2017   IR US GUIDE VASC ACCESS RIGHT  04/07/2017    OB/GYN HISTORY:  OB History  Gravida Para Term Preterm AB Living  5 2    2   SAB IAB Ectopic Multiple Live Births          # Outcome Date GA Lbr Len/2nd Weight Sex Type Anes PTL Lv  5 [place]  4 Gravida           3 Gravida           2 Para           1 Para             Patient's last menstrual period was 03/31/2017 (exact date).  Age at menarche: 44  Age at menopause: 25 Hx of HRT: see HPI Hx of STDs: yes, syphilis (diagnosed at age 71, treated)  Last pap: see HPI History of abnormal pap smears: no, pap prior to 2024 was 2020 ([facility], HR HPV negative)  SCREENING STUDIES:  Last mammogram: 2019  Last colonoscopy: 2022  MEDICATIONS: Outpatient Encounter Medications as of 08/13/2023  Medication Sig   aspirin EC 81 MG tablet Take 81 mg by mouth daily. Swallow whole.   aspirin-acetaminophen-caffeine (EXCEDRIN MIGRAINE)  250-250-65 MG tablet Take 1-3 tablets by mouth every 6 (six) hours as needed for headache or migraine.   fluticasone (FLONASE) 50 MCG/ACT nasal spray Place 2 sprays into both nostrils daily until directed to stop.   loratadine (CLARITIN) 10 MG tablet Take 1 tablet (10 mg total) by mouth daily.   meloxicam (MOBIC) 15 MG tablet Take 1 tablet (15 mg total) by mouth daily.   olmesartan-hydrochlorothiazide (BENICAR HCT) 40-12.5 MG tablet Take 1 tablet by mouth daily.   OVER THE COUNTER MEDICATION B Complex vitiman once daily.   OVER THE COUNTER MEDICATION Pro biotic once daily.   OVER THE COUNTER MEDICATION Magnesium two capsules daily.   OVER THE COUNTER MEDICATION Vitamin D 3, one capsule daily.   OVER THE COUNTER MEDICATION Calcium 600 mg. One capsule daily.   potassium chloride (KLOR-CON M) 10 MEQ tablet Take 1 tablet (10 mEq total) by mouth daily.   [DISCONTINUED] atovaquone-proguanil (MALARONE) 250-100 MG TABS tablet Take 1 tablet by mouth daily. Take with food.  Repeat dose if vomiting occurs within ; Start 2 days before exposure. Stop 7 days after exposure. (Patient not taking: Reported on 08/11/2023)   [DISCONTINUED] estradiol (VIVELLE-DOT) 0.0375 MG/24HR Place 1 patch onto the skin 2 (two) times a week as directed. (Patient not taking: Reported on 08/11/2023)   [DISCONTINUED] ondansetron (ZOFRAN) 4 MG tablet Take 1 tablet (4 mg total) by mouth daily. (Patient not taking: Reported on 06/12/2023)   [DISCONTINUED] progesterone (PROMETRIUM) 100 MG capsule Take 1 capsule (100 mg total) by mouth at bedtime. (Patient not taking: Reported on 08/11/2023)   No facility-administered encounter medications on file as of 08/13/2023.    ALLERGIES:  Allergies  Allergen Reactions   Augmentin [Amoxicillin-Pot Clavulanate] Palpitations    Has patient had a PCN reaction causing immediate rash, facial/tongue/throat swelling, SOB or lightheadedness with hypotension: yes, Heart palpitations and chest  pressure Has patient had a PCN reaction causing severe rash involving mucus membranes or skin necrosis: no Has patient had a PCN reaction that required hospitalization: no Has patient had a PCN reaction occurring within the last 10 years: yes If all of the above answers are "NO", then may proceed with Cephalosporin use.   Shellfish Allergy Hives     FAMILY HISTORY:  Family History  Problem Relation Age of Onset   Cancer Mother        "endometrioisis"   Diabetes Mother    Stroke Mother    Headache Mother    Diabetes Father    Other Father        neuropathy   Headache Sister    Aneurysm Sister  3 brain surgeries   Stomach cancer Maternal Grandmother    Cancer Other    Colon cancer Neg Hx    Esophageal cancer Neg Hx    Rectal cancer Neg Hx    Ovarian cancer Neg Hx    Pancreatic cancer Neg Hx    Prostate cancer Neg Hx    Breast cancer Neg Hx      SOCIAL HISTORY:  Social Connections: Unknown (11/19/2022)   Social Connection and Isolation Panel [NHANES]    Frequency of Communication with Friends and Family: More than three times a week    Frequency of Social Gatherings with Friends and Family: Three times a week    Attends Religious Services: Not on file    Active Member of Clubs or Organizations: Yes    Attends Banker Meetings: More than 4 times per year    Marital Status: Married    REVIEW OF SYSTEMS:  + Increased appetite, fatigue, palpitations, joint pain Denies fevers, chills, unexplained weight changes. Denies hearing loss, neck lumps or masses, mouth sores, ringing in ears or voice changes. Denies cough or wheezing.  Denies shortness of breath. Denies chest pain. Denies leg swelling. Denies abdominal distention, pain, blood in stools, constipation, diarrhea, nausea, vomiting, or early satiety. Denies pain with intercourse, dysuria, frequency, hematuria or incontinence. Denies hot flashes, pelvic pain, vaginal bleeding or vaginal discharge.    Denies back pain or muscle pain/cramps. Denies itching, rash, or wounds. Denies dizziness, headaches, numbness or seizures. Denies swollen lymph nodes or glands, denies easy bruising or bleeding. Denies anxiety, depression, confusion, or decreased concentration.  Physical Exam:  Vital Signs for this encounter:  Blood pressure 136/79, pulse 76, temperature 98.4 F (36.9 C), temperature source Oral, resp. rate 20, height 5\' 9"  (1.753 m), weight 296 lb (134.3 kg), last menstrual period 03/31/2017, SpO2 100%. Body mass index is 43.71 kg/m. General: Alert, oriented, no acute distress.  HEENT: Normocephalic, atraumatic. Sclera anicteric.  Chest: Clear to auscultation bilaterally. No wheezes, rhonchi, or rales. Cardiovascular: Regular rate and rhythm, no murmurs, rubs, or gallops.  Extremities: Grossly normal range of motion. Warm, well perfused. No edema bilaterally.  GU: Deferred.  LABORATORY AND RADIOLOGIC DATA:  Outside medical records were reviewed to synthesize the above history, along with the history and physical obtained during the visit.   Lab Results  Component Value Date   WBC 4.6 06/12/2023   HGB 11.8 (L) 06/12/2023   HCT 36.6 06/12/2023   PLT 264.0 06/12/2023   GLUCOSE 103 (H) 06/12/2023   CHOL 154 06/12/2023   TRIG 47.0 06/12/2023   HDL 31.60 (L) 06/12/2023   LDLCALC 113 (H) 06/12/2023   ALT 10 06/12/2023   AST 13 06/12/2023   NA 140 06/12/2023   K 3.5 06/12/2023   CL 106 06/12/2023   CREATININE 0.61 06/12/2023   BUN 9 06/12/2023   CO2 27 06/12/2023   TSH 1.83 06/12/2023   INR 1.02 04/07/2017   HGBA1C 4.9 06/12/2023

## 2023-08-13 ENCOUNTER — Inpatient Hospital Stay: Payer: Commercial Managed Care - PPO | Attending: Gynecologic Oncology | Admitting: Gynecologic Oncology

## 2023-08-13 ENCOUNTER — Other Ambulatory Visit: Payer: Self-pay

## 2023-08-13 ENCOUNTER — Inpatient Hospital Stay: Payer: Commercial Managed Care - PPO | Admitting: Gynecologic Oncology

## 2023-08-13 ENCOUNTER — Other Ambulatory Visit (HOSPITAL_COMMUNITY): Payer: Self-pay

## 2023-08-13 ENCOUNTER — Encounter: Payer: Self-pay | Admitting: Gynecologic Oncology

## 2023-08-13 VITALS — BP 136/79 | HR 76 | Temp 98.4°F | Resp 20 | Ht 69.0 in | Wt 296.0 lb

## 2023-08-13 DIAGNOSIS — Z6841 Body Mass Index (BMI) 40.0 and over, adult: Secondary | ICD-10-CM | POA: Diagnosis not present

## 2023-08-13 DIAGNOSIS — F32A Depression, unspecified: Secondary | ICD-10-CM | POA: Insufficient documentation

## 2023-08-13 DIAGNOSIS — N950 Postmenopausal bleeding: Secondary | ICD-10-CM | POA: Insufficient documentation

## 2023-08-13 DIAGNOSIS — N870 Mild cervical dysplasia: Secondary | ICD-10-CM

## 2023-08-13 DIAGNOSIS — F419 Anxiety disorder, unspecified: Secondary | ICD-10-CM | POA: Diagnosis not present

## 2023-08-13 DIAGNOSIS — Z79899 Other long term (current) drug therapy: Secondary | ICD-10-CM | POA: Diagnosis not present

## 2023-08-13 DIAGNOSIS — Z8049 Family history of malignant neoplasm of other genital organs: Secondary | ICD-10-CM | POA: Diagnosis not present

## 2023-08-13 DIAGNOSIS — I10 Essential (primary) hypertension: Secondary | ICD-10-CM | POA: Diagnosis not present

## 2023-08-13 DIAGNOSIS — Z800 Family history of malignant neoplasm of digestive organs: Secondary | ICD-10-CM | POA: Insufficient documentation

## 2023-08-13 DIAGNOSIS — Z791 Long term (current) use of non-steroidal anti-inflammatories (NSAID): Secondary | ICD-10-CM | POA: Diagnosis not present

## 2023-08-13 DIAGNOSIS — R102 Pelvic and perineal pain: Secondary | ICD-10-CM | POA: Insufficient documentation

## 2023-08-13 DIAGNOSIS — R87619 Unspecified abnormal cytological findings in specimens from cervix uteri: Secondary | ICD-10-CM

## 2023-08-13 DIAGNOSIS — E785 Hyperlipidemia, unspecified: Secondary | ICD-10-CM | POA: Diagnosis not present

## 2023-08-13 DIAGNOSIS — J45909 Unspecified asthma, uncomplicated: Secondary | ICD-10-CM | POA: Diagnosis not present

## 2023-08-13 DIAGNOSIS — K219 Gastro-esophageal reflux disease without esophagitis: Secondary | ICD-10-CM | POA: Diagnosis not present

## 2023-08-13 MED ORDER — SENNOSIDES-DOCUSATE SODIUM 8.6-50 MG PO TABS
2.0000 | ORAL_TABLET | Freq: Every day | ORAL | 0 refills | Status: AC
Start: 2023-08-13 — End: ?
  Filled 2023-08-13: qty 30, 15d supply, fill #0

## 2023-08-13 NOTE — Patient Instructions (Addendum)
Preparing for your Surgery  Plan for surgery on August 24, 2023 with Dr. Eugene Garnet at [facility]. You will be scheduled for dilation and curettage of the uterus with hysteroscopy and myosure (dilating the cervix and taking a sampling from the lining of the uterus), endocervical curettage (sampling from the endocervix), cervical biopsy, possible cold knife conization of the cervix (cone shaped biopsy taken from the cervix).   Pre-operative Testing -You will receive a phone call from presurgical testing at [facility] to discuss surgery instructions and arrange for lab work if needed.  -Bring your insurance card, copy of an advanced directive if applicable, medication list.  -You should not be taking blood thinners or aspirin at least ten days prior to surgery unless instructed by your surgeon.  -Do not take supplements such as fish oil (omega 3), red yeast rice, turmeric before your surgery. You want to avoid medications with aspirin in them including headache powders such as BC or Goody's), Excedrin migraine.  Day Before Surgery at Home -You will be advised you can have clear liquids up until 3 hours before your surgery.    Your role in recovery Your role is to become active as soon as directed by your doctor, while still giving yourself time to heal.  Rest when you feel tired. You will be asked to do the following in order to speed your recovery:  - Cough and breathe deeply. This helps to clear and expand your lungs and can prevent pneumonia after surgery.  - STAY ACTIVE WHEN YOU GET HOME. Do mild physical activity. Walking or moving your legs help your circulation and body functions return to normal. Do not try to get up or walk alone the first time after surgery.   -If you develop swelling on one leg or the other, pain in the back of your leg, redness/warmth in one of your legs, please call the office or go to the Emergency Room to have a doppler to rule out a  blood clot. For shortness of breath, chest pain-seek care in the Emergency Room as soon as possible. - Actively manage your pain. Managing your pain lets you move in comfort. We will ask you to rate your pain on a scale of zero to 10. It is your responsibility to tell your doctor or nurse where and how much you hurt so your pain can be treated.  Special Considerations -Your final pathology results from surgery should be available around one week after surgery and the results will be relayed to you when available.  -FMLA forms can be faxed to [phone] and please allow 5-7 business days for completion.  Pain Management After Surgery -Make sure that you have Tylenol and Ibuprofen at home IF YOU ARE ABLE TO TAKE THESE MEDICATIONS to use on a regular basis after surgery for pain control. We recommend alternating the medications every hour to six hours since they work differently and are processed in the body differently for pain relief.  -Review the attached handout on narcotic use and their risks and side effects.   Bowel Regimen -It is important to prevent constipation and drink adequate amounts of liquids.   Risks of Surgery Risks of surgery are low but include bleeding, infection, damage to surrounding structures, re-operation, blood clots, and very rarely death.  AFTER SURGERY INSTRUCTIONS  Return to work:  1-2 days if applicable  IF YOU HAVE A CONIZATION OF THE CERVIX: You may notice a piece of gauze like-material come out of  the vagina over the next several days. This is NORMAL and is used during surgery to help stop bleeding at the cervix.   Activity: 1. Be up and out of the bed during the day.  Take a nap if needed.  You may walk up steps but be careful and use the hand rail.  Stair climbing will tire you more than you think, you may need to stop part way and rest.   2. No lifting or straining for at least 24 hours over 10 pounds. No pushing, pulling, straining for at least 24  hours after procedure. Can return to work two days after the procedure.  3. No driving for minimum 24 hours after surgery.  Do not drive if you are taking narcotic pain medicine and make sure that your reaction time has returned.   4. You can shower as soon as the next day after surgery. Shower daily. No tub baths or submerging your body in water until cleared by your surgeon. If you have the soap that was given to you by pre-surgical testing that was used before surgery, you do not need to use it afterwards because this can irritate your incisions.   5. No sexual activity and nothing in the vagina for 4-6 weeks.  6. You may experience vaginal spotting and discharge after surgery.  The spotting is normal but if you experience heavy bleeding, call our office.  7. Take Tylenol or ibuprofen first for pain if you are able to take these medications.  Monitor your Tylenol intake to a max of 4,000 mg in a 24 hour period. You can alternate these medications after surgery.  Diet: 1. Low sodium Heart Healthy Diet is recommended but you are cleared to resume your normal (before surgery) diet after your procedure.  2. It is safe to use a laxative, such as Miralax or Colace, if you have difficulty moving your bowels. You have been prescribed Sennakot at bedtime every evening to keep bowel movements regular and to prevent constipation.    Wound Care: 1. Keep clean and dry.  Shower daily.  Reasons to call the Doctor: Fever - Oral temperature greater than 100.4 degrees Fahrenheit Foul-smelling vaginal discharge Difficulty urinating Nausea and vomiting Increased pain at the site of the incision that is unrelieved with pain medicine. Difficulty breathing with or without chest pain New calf pain especially if only on one side Sudden, continuing increased vaginal bleeding with or without clots.   Contacts: For questions or concerns you should contact:  Dr. Eugene Garnet at [phone]  Warner Mccreedy, NP at [phone]  After Hours: call [phone] and have the GYN Oncologist paged/contacted (after 5 pm or on the weekends).  Messages sent via mychart are for non-urgent matters and are not responded to after hours so for urgent needs, please call the after hours number.

## 2023-08-13 NOTE — Progress Notes (Signed)
Patient here for new patient consultation with Dr. Pricilla Holm and for a pre-operative appointment prior to her scheduled surgery on 08/24/2023. She is scheduled for D&C hystero, cervical bx, ECC, poss CKC.  The surgery was discussed in detail.  See after visit summary for additional details.       Discussed post-op pain management in detail including the aspects of the enhanced recovery pathway.  We discussed the use of tylenol post-op.  Also prescribed sennakot to be used after surgery and to hold if having loose stools.  Discussed bowel regimen in detail.     Discussed measures to take at home to prevent DVT including frequent mobility.  Reportable signs and symptoms of DVT discussed. Post-operative instructions discussed and expectations for after surgery.    5 minutes spent with the patient.  Verbalizing understanding of material discussed. No needs or concerns voiced at the end of the visit.   Advised patient to call for any needs.    This appointment is included in the global surgical bundle as pre-operative teaching and has no charge.

## 2023-08-14 NOTE — Patient Instructions (Signed)
SURGICAL WAITING ROOM VISITATION Patients having surgery or a procedure may have no more than 2 support people in the waiting area - these visitors may rotate.    Children under the age of 26 must have an adult with them who is not the patient.  If the patient needs to stay at the hospital during part of their recovery, the visitor guidelines for inpatient rooms apply. Pre-op nurse will coordinate an appropriate time for 1 support person to accompany patient in pre-op.  This support person may not rotate.    Please refer to the [facility] website for the visitor guidelines for Inpatients (after your surgery is over and you are in a regular room).       Your procedure is scheduled on: 08-24-23   Report to [facility] Main Entrance    Report to admitting at 5:30 AM   Call this number if you have problems the morning of surgery [phone]   Do not eat food :After Midnight.   After Midnight you may have the following liquids until 4:45 AM DAY OF SURGERY  Water Non-Citrus Juices (without pulp, NO RED-Apple, White grape, White cranberry) Black Coffee (NO MILK/CREAM OR CREAMERS, sugar ok)  Clear Tea (NO MILK/CREAM OR CREAMERS, sugar ok) regular and decaf                             Plain Jell-O (NO RED)                                           Fruit ices (not with fruit pulp, NO RED)                                     Popsicles (NO RED)                                                               Sports drinks like Gatorade (NO RED)                      If you have questions, please contact your surgeon's office.   FOLLOW  ANY ADDITIONAL PRE OP INSTRUCTIONS YOU RECEIVED FROM YOUR SURGEON'S OFFICE!!!     Oral Hygiene is also important to reduce your risk of infection.                                    Remember - BRUSH YOUR TEETH THE MORNING OF SURGERY WITH YOUR REGULAR TOOTHPASTE   Do NOT smoke after Midnight   Take these medicines the morning of surgery with A SIP  OF WATER:   Claritin  Omeprazole  Flonase nasal spray  Tylenol if needed  Stop all vitamins and herbal supplements 7 days before surgery  Bring CPAP mask and tubing day of surgery.  You may not have any metal on your body including hair pins, jewelry, and body piercing             Do not wear make-up, lotions, powders, perfumes or deodorant  Do not wear nail polish including gel and S&S, artificial/acrylic nails, or any other type of covering on natural nails including finger and toenails. If you have artificial nails, gel coating, etc. that needs to be removed by a nail salon please have this removed prior to surgery or surgery may need to be canceled/ delayed if the surgeon/ anesthesia feels like they are unable to be safely monitored.   Do not shave  48 hours prior to surgery.    Do not bring valuables to the hospital. [facility] IS NOT RESPONSIBLE   FOR VALUABLES.   Contacts, dentures or bridgework may not be worn into surgery.  DO NOT BRING YOUR HOME MEDICATIONS TO THE HOSPITAL. PHARMACY WILL DISPENSE MEDICATIONS LISTED ON YOUR MEDICATION LIST TO YOU DURING YOUR ADMISSION IN THE HOSPITAL!    Patients discharged on the day of surgery will not be allowed to drive home.  Someone NEEDS to stay with you for the first 24 hours after anesthesia.   Special Instructions: Bring a copy of your healthcare power of attorney and living will documents the day of surgery if you haven't scanned them before.              Please read over the following fact sheets you were given: IF YOU HAVE QUESTIONS ABOUT YOUR PRE-OP INSTRUCTIONS PLEASE CALL [phone] [facility]  If you received a COVID test during your pre-op visit  it is requested that you wear a mask when out in public, stay away from anyone that may not be feeling well and notify your surgeon if you develop symptoms. If you test positive for Covid or have been in contact with anyone that has tested positive in the  last 10 days please notify you surgeon.  [facility] - Preparing for Surgery Before surgery, you can play an important role.  Because skin is not sterile, your skin needs to be as free of germs as possible.  You can reduce the number of germs on your skin by washing with CHG (chlorahexidine gluconate) soap before surgery.  CHG is an antiseptic cleaner which kills germs and bonds with the skin to continue killing germs even after washing. Please DO NOT use if you have an allergy to CHG or antibacterial soaps.  If your skin becomes reddened/irritated stop using the CHG and inform your nurse when you arrive at Short Stay. Do not shave (including legs and underarms) for at least 48 hours prior to the first CHG shower.  You may shave your face/neck.  Please follow these instructions carefully:  1.  Shower with CHG Soap the night before surgery and the  morning of surgery.  2.  If you choose to wash your hair, wash your hair first as usual with your normal  shampoo.  3.  After you shampoo, rinse your hair and body thoroughly to remove the shampoo.                             4.  Use CHG as you would any other liquid soap.  You can apply chg directly to the skin and wash.  Gently with a scrungie or clean washcloth.  5.  Apply the CHG Soap to your body ONLY FROM THE  NECK DOWN.   Do   not use on face/ open                           Wound or open sores. Avoid contact with eyes, ears mouth and   genitals (private parts).                       Wash face,  Genitals (private parts) with your normal soap.             6.  Wash thoroughly, paying special attention to the area where your    surgery  will be performed.  7.  Thoroughly rinse your body with warm water from the neck down.  8.  DO NOT shower/wash with your normal soap after using and rinsing off the CHG Soap.                9.  Pat yourself dry with a clean towel.            10.  Wear clean pajamas.            11.  Place clean sheets on your bed the  night of your first shower and do not  sleep with pets. Day of Surgery : Do not apply any lotions/deodorants the morning of surgery.  Please wear clean clothes to the hospital/surgery center.  FAILURE TO FOLLOW THESE INSTRUCTIONS MAY RESULT IN THE CANCELLATION OF YOUR SURGERY  PATIENT SIGNATURE_________________________________  NURSE SIGNATURE__________________________________  ________________________________________________________________________

## 2023-08-17 ENCOUNTER — Encounter: Payer: Self-pay | Admitting: Obstetrics and Gynecology

## 2023-08-18 NOTE — Progress Notes (Signed)
COVID Vaccine Completed:  Yes  Date of COVID positive in last 90 days:  PCP - Hetty Blend, NP-C Cardiologist -   Chest x-ray -  EKG -  Stress Test -  ECHO - 04-25-14 Epic Cardiac Cath -  Pacemaker/ICD device last checked: Spinal Cord Stimulator:  Bowel Prep -   Sleep Study -  CPAP -   Fasting Blood Sugar -  Checks Blood Sugar _____ times a day  Last dose of GLP1 agonist-  N/A GLP1 instructions:  Hold 7 days before surgery    Last dose of SGLT-2 inhibitors-  N/A SGLT-2 instructions:  Hold 3 days before surgery    Blood Thinner Instructions:  Time Aspirin Instructions: Last Dose:  Activity level:  Can go up a flight of stairs and perform activities of daily living without stopping and without symptoms of chest pain or shortness of breath.  Able to exercise without symptoms  Unable to go up a flight of stairs without symptoms of     Anesthesia review:  Hx of CVA  Patient denies shortness of breath, fever, cough and chest pain at PAT appointment  Patient verbalized understanding of instructions that were given to them at the PAT appointment. Patient was also instructed that they will need to review over the PAT instructions again at home before surgery.

## 2023-08-19 ENCOUNTER — Encounter (HOSPITAL_COMMUNITY): Payer: Self-pay

## 2023-08-19 ENCOUNTER — Other Ambulatory Visit: Payer: Self-pay

## 2023-08-19 ENCOUNTER — Encounter (HOSPITAL_COMMUNITY)
Admission: RE | Admit: 2023-08-19 | Discharge: 2023-08-19 | Disposition: A | Discharge status: Home / Self Care | Payer: Commercial Managed Care - PPO | Source: Ambulatory Visit | Attending: Gynecologic Oncology | Admitting: Gynecologic Oncology

## 2023-08-19 VITALS — BP 141/80 | HR 70 | Temp 98.9°F | Resp 16 | Ht 69.0 in | Wt 294.2 lb

## 2023-08-19 DIAGNOSIS — I10 Essential (primary) hypertension: Secondary | ICD-10-CM | POA: Diagnosis not present

## 2023-08-19 DIAGNOSIS — Z01818 Encounter for other preprocedural examination: Secondary | ICD-10-CM | POA: Diagnosis not present

## 2023-08-19 HISTORY — DX: Other specified postprocedural states: Z98.890

## 2023-08-19 HISTORY — DX: Nausea with vomiting, unspecified: R11.2

## 2023-08-19 LAB — CBC
HCT: 32.6 % — ABNORMAL LOW (ref 36.0–46.0)
Hemoglobin: 11.3 g/dL — ABNORMAL LOW (ref 12.0–15.0)
MCH: 31.4 pg (ref 26.0–34.0)
MCHC: 34.7 g/dL (ref 30.0–36.0)
MCV: 90.6 fL (ref 80.0–100.0)
Platelets: 243 10*3/uL (ref 150–400)
RBC: 3.6 MIL/uL — ABNORMAL LOW (ref 3.87–5.11)
RDW: 13.1 % (ref 11.5–15.5)
WBC: 4.8 10*3/uL (ref 4.0–10.5)
nRBC: 0 % (ref 0.0–0.2)

## 2023-08-19 LAB — BASIC METABOLIC PANEL
Anion gap: 7 (ref 5–15)
BUN: 12 mg/dL (ref 6–20)
CO2: 23 mmol/L (ref 22–32)
Calcium: 8.8 mg/dL — ABNORMAL LOW (ref 8.9–10.3)
Chloride: 108 mmol/L (ref 98–111)
Creatinine, Ser: 0.54 mg/dL (ref 0.44–1.00)
GFR, Estimated: >60 mL/min (ref >60)
Glucose, Bld: 104 mg/dL — ABNORMAL HIGH (ref 70–99)
Potassium: 3.5 mmol/L (ref 3.5–5.1)
Sodium: 138 mmol/L (ref 135–145)

## 2023-08-19 NOTE — Progress Notes (Signed)
   08/19/23 1052  OBSTRUCTIVE SLEEP APNEA  Have you ever been diagnosed with sleep apnea through a sleep study? No  Do you snore loudly (loud enough to be heard through closed doors)?  1  Do you often feel tired, fatigued, or sleepy during the daytime (such as falling asleep during driving or talking to someone)? 0  Has anyone observed you stop breathing during your sleep? 0  Do you have, or are you being treated for high blood pressure? 1  BMI more than 35 kg/m2? 1  Age > 50 (1-yes) 1  Neck circumference greater than:Female 16 inches or larger, Female 17inches or larger? 1  Female Gender (Yes=1) 0  Obstructive Sleep Apnea Score 5  Score 5 or greater  Results sent to PCP

## 2023-08-21 ENCOUNTER — Telehealth: Payer: Self-pay | Admitting: *Deleted

## 2023-08-21 NOTE — Telephone Encounter (Addendum)
Spoke with patient this morning who called the office to let us know that she is not feeling well with fever, congestion, headache and fatigue. Pt is being tested for the flu and covid. Pt states if she gets the results back today she will call to let us know. Pt's surgery has been rescheduled for Thursday, January 2nd. Advised patient to call the office next week to let us know how she is feeling. Pt verbalized understanding.

## 2023-08-24 DIAGNOSIS — R87619 Unspecified abnormal cytological findings in specimens from cervix uteri: Secondary | ICD-10-CM

## 2023-08-27 NOTE — Telephone Encounter (Signed)
Spoke with Ashley Davis who called the office still experiencing signs & symptoms of covid. Pt states [facility] sent her for a covid test but it was negative. Pt is still not feeling well and she has no sense of taste. Pt is going to call her PCP today for an appointment. Advised patient that we will probably have to reschedule her surgery from January 2nd and the office would reach back out with a new date. Pt verbalized understanding.

## 2023-08-31 NOTE — Telephone Encounter (Signed)
Spoke with Ms. Ashley Davis and relayed message from Ashley Mccreedy, NP that her surgery has been rescheduled for January 8th. Patient states she is feeling so much better and currently without symptoms. Pt is asking about how long she will be out of work. She was told originally that she could go back to work three days after procedure? Advised patient that may be a possibility, but usually it's 2-4 weeks and she may have restrictions if she goes back after only several days. Advised patient that her question would be relayed to providers and the office would call back.

## 2023-09-03 DIAGNOSIS — R87619 Unspecified abnormal cytological findings in specimens from cervix uteri: Secondary | ICD-10-CM

## 2023-09-03 NOTE — Telephone Encounter (Signed)
 Attempted to reach Ms. Ashley Davis. Left voicemail requesting call back.

## 2023-09-03 NOTE — Telephone Encounter (Signed)
 Spoke with Ms. Ashley Davis who returned call from office. Pt states she is feeling well and her last upper respiratory symptom was last Saturday, December 28 th. Pt also would like to put her procedure/surgery on hold. Pt states she needs more time to process the information from Dr. Viktoria. Pt would like to set up a phone visit with provider.  Advised patient her message would be relayed to Dr. Viktoria.

## 2023-09-04 ENCOUNTER — Inpatient Hospital Stay: Payer: Commercial Managed Care - PPO | Admitting: Gynecologic Oncology

## 2023-09-04 ENCOUNTER — Encounter: Payer: Self-pay | Admitting: Gynecologic Oncology

## 2023-09-04 NOTE — Telephone Encounter (Signed)
 Attempted to reach Ms. Borelli in regards to change in phone visit from today to Tuesday, 09/08/23 with Dr. Pricilla Holm at 6 pm. Left voicemail requesting phone visit.

## 2023-09-04 NOTE — Telephone Encounter (Signed)
 Attempted to reach Ashley Davis in regards to phone visit that has been scheduled with Dr. Pricilla Holm today at 4:20 pm. Left voicemail requesting call back.

## 2023-09-08 ENCOUNTER — Inpatient Hospital Stay: Payer: Commercial Managed Care - PPO | Attending: Gynecologic Oncology | Admitting: Gynecologic Oncology

## 2023-09-08 DIAGNOSIS — R87619 Unspecified abnormal cytological findings in specimens from cervix uteri: Secondary | ICD-10-CM

## 2023-09-08 NOTE — Progress Notes (Signed)
 I called the patient after our scheduled phone visit due to the work emergency. No answer. Left a message apologizing for my delay in calling and letting her know that I would have the office call tomorrow to figure out when would be a good time to have a conversation later this week.  Comer Dollar MD

## 2023-09-09 ENCOUNTER — Telehealth: Payer: Self-pay | Admitting: Gynecologic Oncology

## 2023-09-09 ENCOUNTER — Ambulatory Visit (HOSPITAL_COMMUNITY)
Admission: RE | Admit: 2023-09-09 | Payer: Commercial Managed Care - PPO | Source: Home / Self Care | Admitting: Gynecologic Oncology

## 2023-09-09 DIAGNOSIS — R87619 Unspecified abnormal cytological findings in specimens from cervix uteri: Secondary | ICD-10-CM

## 2023-09-09 SURGERY — DILATATION & CURETTAGE/HYSTEROSCOPY WITH MYOSURE
Anesthesia: Choice

## 2023-09-09 NOTE — Telephone Encounter (Signed)
 Called the patient again. No answer. Left VM requesting either callback or mychart message to let me know when I can call Thurs, Fri, or over the weekend.  Eugene Garnet MD Gynecologic Oncology

## 2023-09-11 ENCOUNTER — Telehealth: Payer: Self-pay | Admitting: Oncology

## 2023-09-11 NOTE — Telephone Encounter (Signed)
 Called Ashley Davis about setting up a telephone visit with Dr. Viktoria to discuss surgery.  She apologized for missing Dr. Lewie call last week and said it was because she was working.  She said next Tuesday afternoon or Wednesday since she is off work. She is working over the weekend and Monday night.  She wanted to thank Dr. Viktoria for taking the time to call her.

## 2023-09-16 ENCOUNTER — Inpatient Hospital Stay: Payer: Commercial Managed Care - PPO | Admitting: Gynecologic Oncology

## 2023-09-16 ENCOUNTER — Encounter: Payer: Self-pay | Admitting: Gynecologic Oncology

## 2023-09-16 DIAGNOSIS — R87619 Unspecified abnormal cytological findings in specimens from cervix uteri: Secondary | ICD-10-CM | POA: Diagnosis not present

## 2023-09-16 DIAGNOSIS — Z7189 Other specified counseling: Secondary | ICD-10-CM | POA: Diagnosis not present

## 2023-09-16 DIAGNOSIS — N950 Postmenopausal bleeding: Secondary | ICD-10-CM

## 2023-09-16 NOTE — Progress Notes (Signed)
 Gynecologic Oncology Telehealth Note: Gyn-Onc  I connected with Ashley Davis on 09/16/23 at  6:00 PM EST by telephone and verified that I am speaking with the correct person using two identifiers.  I discussed the limitations, risks, security and privacy concerns of performing an evaluation and management service by telemedicine and the availability of in-person appointments. I also discussed with the patient that there may be a patient responsible charge related to this service. The patient expressed understanding and agreed to proceed.  Other persons participating in the visit and their role in the encounter: none.  Patient's location: home Provider's location: [facility]  Reason for Visit: follow-up  Treatment History: Pap test: 09/2022 - [facility] 10/2022: Started HRT with [facility] The patient was seen in October and at that time endorsed 2-3 episodes of postmenopausal bleeding within the last year.   [facility]: Uterus measures 10.35 x 8.92 x 6.6 cm.  Intramural and subserosal calcified fibroid seen, largest measures up to 6.3 cm.  Endometrial lining measures 3 mm.  Ovaries not visualized although no masses seen.  No free fluid. EMB on 06/25/23: Low-grade squamous intraepithelial lesion, CIN-1.  Few atypical glands, suspicious for adenocarcinoma in situ.  Scant benign inactive endometrium.   She has a history of uterine fibroids and underwent uterine artery embolization in 2018.  Prior to that procedure, her uterus measured 15.9 x 8.7 x 13 cm with a 7.6 cm left-sided fundal fibroid, 8.7 cm right uterine body fibroid, and small submucosal fibroid.  Follow-up MRI in 2019 showed interval decrease in uterine size as well as devascularization of dominant uterine fibroids.  Interval History: Doing well. Tired, worked last night. Anxious about procedure.  Past Medical/Surgical History: Past Medical History:  Diagnosis Date   Allergy    Anemia     Anxiety    Arthritis    Asthma    Depression    GERD (gastroesophageal reflux disease)    History of bronchitis    Hyperlipidemia    Hypertension    Pt states her HTN is due to her migraines   Migraine    PONV (postoperative nausea and vomiting)    Tonsil stone 04/25/2019    Past Surgical History:  Procedure Laterality Date   BREAST BIOPSY     COLONOSCOPY  11/17/2022   IR ANGIOGRAM PELVIS SELECTIVE OR SUPRASELECTIVE  04/07/2017   IR ANGIOGRAM PELVIS SELECTIVE OR SUPRASELECTIVE  04/07/2017   IR ANGIOGRAM SELECTIVE EACH ADDITIONAL VESSEL  04/07/2017   IR ANGIOGRAM SELECTIVE EACH ADDITIONAL VESSEL  04/07/2017   IR ANGIOGRAM VISCERAL SELECTIVE  04/07/2017   IR EMBO TUMOR ORGAN ISCHEMIA INFARCT INC GUIDE ROADMAPPING  04/07/2017   IR RADIOLOGIST EVAL & MGMT  01/01/2017   IR RADIOLOGIST EVAL & MGMT  04/30/2017   IR US  GUIDE VASC ACCESS RIGHT  04/07/2017    Family History  Problem Relation Age of Onset   Cancer Mother        "endometrioisis"   Diabetes Mother    Stroke Mother    Headache Mother    Diabetes Father    Other Father        neuropathy   Headache Sister    Aneurysm Sister        3 brain surgeries   Stomach cancer Maternal Grandmother    Cancer Other    Colon cancer Neg Hx    Esophageal cancer Neg Hx    Rectal cancer Neg Hx    Ovarian cancer  Neg Hx    Pancreatic cancer Neg Hx    Prostate cancer Neg Hx    Breast cancer Neg Hx     Social History   Socioeconomic History   Marital status: Married    Spouse name: Darren   Number of children: 2   Years of education: Nature conservation officer education level: Some college, no degree  Occupational History    Comment: Med Tech, [employer]   Occupation: [employer]  Tobacco Use   Smoking status: Former    Current packs/day: 0.00    Average packs/day: 0.3 packs/day for 30.0 years (7.5 ttl pk-yrs)    Types: Cigarettes    Start date: 04/07/1987    Quit date: 04/06/2017    Years since quitting: 6.4    Smokeless tobacco: Never  Vaping Use   Vaping status: Never Used  Substance and Sexual Activity   Alcohol use: Yes    Comment: occasional   Drug use: No   Sexual activity: Yes    Birth control/protection: Post-menopausal  Other Topics Concern   Not on file  Social History Narrative   Patient lives at home with family.   Caffeine  Use: 1 cup 2-3 times weekly   Social Drivers of Health   Financial Resource Strain: Low Risk  (11/19/2022)   Overall Financial Resource Strain (CARDIA)    Difficulty of Paying Living Expenses: Not very hard  Food Insecurity: No Food Insecurity (11/19/2022)   Hunger Vital Sign    Worried About Running Out of Food in the Last Year: Never true    Ran Out of Food in the Last Year: Never true  Transportation Needs: No Transportation Needs (11/19/2022)   PRAPARE - Administrator, Civil Service (Medical): No    Lack of Transportation (Non-Medical): No  Physical Activity: Insufficiently Active (11/19/2022)   Exercise Vital Sign    Days of Exercise per Week: 3 days    Minutes of Exercise per Session: 30 min  Stress: No Stress Concern Present (11/19/2022)   [employer] of Occupational Health - Occupational Stress Questionnaire    Feeling of Stress : Not at all  Social Connections: Unknown (11/19/2022)   Social Connection and Isolation Panel [NHANES]    Frequency of Communication with Friends and Family: More than three times a week    Frequency of Social Gatherings with Friends and Family: Three times a week    Attends Religious Services: Not on file    Active Member of Clubs or Organizations: Yes    Attends Banker Meetings: More than 4 times per year    Marital Status: Married    Current Medications:  Current Outpatient Medications:    acetaminophen  (TYLENOL ) 500 MG tablet, Take 1,000 mg by mouth every 6 (six) hours as needed for headache., Disp: , Rfl:    acetaminophen  (TYLENOL ) 650 MG CR tablet, Take 1,300 mg by mouth every 8  (eight) hours as needed for pain (Arthritis)., Disp: , Rfl:    aspirin  EC 81 MG tablet, Take 81 mg by mouth daily. Swallow whole., Disp: , Rfl:    Cholecalciferol (VITAMIN D3) 125 MCG (5000 UT) CAPS, Take 10,000 Units by mouth 3 (three) times a week., Disp: , Rfl:    fluticasone  (FLONASE ) 50 MCG/ACT nasal spray, Place 2 sprays into both nostrils daily until directed to stop. (Patient taking differently: Place 2 sprays into both nostrils daily as needed for allergies.), Disp: 48 g, Rfl: 3   loratadine  (CLARITIN ) 10 MG  tablet, Take 1 tablet (10 mg total) by mouth daily., Disp: 90 tablet, Rfl: 0   Magnesium Oxide 240 MG PACK, Take 480 mg by mouth daily., Disp: , Rfl:    meloxicam  (MOBIC ) 15 MG tablet, Take 1 tablet (15 mg total) by mouth daily. (Patient taking differently: Take 15 mg by mouth daily as needed for pain.), Disp: 30 tablet, Rfl: 0   Naphazoline-Pheniramine (EYE ALLERGY RELIEF OP), Place 1 drop into both eyes daily as needed (itchy eyes)., Disp: , Rfl:    olmesartan -hydrochlorothiazide  (BENICAR  HCT) 40-12.5 MG tablet, Take 1 tablet by mouth daily., Disp: 90 tablet, Rfl: 1   omeprazole (PRILOSEC) 20 MG capsule, Take 40 mg by mouth daily as needed (Heartburn)., Disp: , Rfl:    OVER THE COUNTER MEDICATION, Take 1 tablet by mouth daily. Pro biotic, Disp: , Rfl:    OVER THE COUNTER MEDICATION, Take 600 mg by mouth daily. Calcium, Disp: , Rfl:    potassium chloride  (KLOR-CON  M) 10 MEQ tablet, Take 1 tablet (10 mEq total) by mouth daily., Disp: 30 tablet, Rfl: 2   senna-docusate (SENOKOT-S) 8.6-50 MG tablet, Take 2 tablets by mouth at bedtime. For AFTER surgery, do not take if having diarrhea, Disp: 30 tablet, Rfl: 0  Review of Symptoms: Pertinent positives as per HPI.  Physical Exam: Deferred given limitations of phone visit.  Laboratory & Radiologic Studies: None new  Assessment & Plan: Ashley Davis is a 58 y.o. woman with atypical glandular cells on recent EMB concerning for  AIS.  The patient's initial procedure was canceled due to respiratory illness.  She is feeling much better from the standpoint.  She has significant anxiety about the procedure that we discussed at her outpatient visit.  We spent some time this evening discussing again what her initial biopsy in October showed and why I am recommending additional workup to determine whether atypical cells represent precancer or malignancy and to better define if these are coming from the endocervix or endometrium.  This would determine what if any additional treatment she would need.  She asked about performing a hysterectomy from the outset.  While I can understand that this would in some ways be easier and avoid needing a second procedure, we discussed that the treatment may be different (and potentially not even a discussion of treatment) depending on what pathology shows.  Ultimately, she would like a little bit more time to think about things.  I have asked her to call my office to speak to Mariah Shines when she is ready to move forward 1 where the other.  I will have Mariah Shines reach out to her on Monday if she has not heard from her before then.  I discussed the assessment and treatment plan with the patient. The patient was provided with an opportunity to ask questions and all were answered. The patient agreed with the plan and demonstrated an understanding of the instructions.   The patient was advised to call back or see an in-person evaluation if the symptoms worsen or if the condition fails to improve as anticipated.   16 minutes of total time was spent for this patient encounter, including preparation, phone counseling with the patient and coordination of care, and documentation of the encounter.   Wiley Hanger, MD  Division of Gynecologic Oncology  Department of Obstetrics and Gynecology  [facility]

## 2023-09-18 ENCOUNTER — Other Ambulatory Visit (HOSPITAL_COMMUNITY): Payer: Self-pay

## 2023-09-21 ENCOUNTER — Telehealth: Payer: Self-pay | Admitting: Oncology

## 2023-09-21 NOTE — Telephone Encounter (Signed)
Called Ashley Davis regarding her decision about rescheduling the procedure.  She has decided to reschedule but wants to wait until the week of April 14,2025 due to work and getting things in order.  Advised I would notify Dr. Pricilla Holm and call her back with any recommendations and potential surgery dates.

## 2023-09-23 ENCOUNTER — Ambulatory Visit: Payer: Commercial Managed Care - PPO | Admitting: Family Medicine

## 2023-09-23 ENCOUNTER — Encounter: Payer: Self-pay | Admitting: Oncology

## 2023-09-24 ENCOUNTER — Encounter: Payer: Commercial Managed Care - PPO | Admitting: Gynecologic Oncology

## 2023-09-24 NOTE — Telephone Encounter (Signed)
Left a message advised that Dr. Pricilla Holm would advise that she schedule the surgery sooner than April.  Requested a return call to discuss.

## 2023-09-25 NOTE — Telephone Encounter (Signed)
Left another message requesting a return call to discuss surgery.

## 2023-09-29 NOTE — Telephone Encounter (Signed)
Ashley Davis called back and apologized for missing my calls.  She has been working and was not able to call back.  Discussed that Dr. Pricilla Holm would like her to have the procedure done sooner than April.  Dion said she can't do it earlier and it needs to be in April.  Went over the potential dates and she would like to schedule for 12/16/23.

## 2023-09-29 NOTE — Telephone Encounter (Signed)
Left another message to discuss surgery dates.  Requested a return call.

## 2023-09-29 NOTE — Telephone Encounter (Signed)
Thanks Clydie Braun. Appreciate you reaching out again. I'm going to speak with her referring OBGYN too

## 2023-10-07 ENCOUNTER — Telehealth: Payer: Self-pay

## 2023-10-07 NOTE — Telephone Encounter (Signed)
 Per Vira Grieves NP, Pt needs a pre-op (in person or phone) with Dr.Tucker before her procedure on 4/16.   I LVM for Ms.Annette Barters to call office to get scheduled with Dr.Tucker.

## 2023-10-08 NOTE — Telephone Encounter (Signed)
 LVM for Ms.Annette Barters to call office regarding an appointment with Dr.Tucker

## 2023-10-09 NOTE — Telephone Encounter (Signed)
 Pt is scheduled for 4/10 for phone visit(per pt's request).

## 2023-10-26 ENCOUNTER — Other Ambulatory Visit (HOSPITAL_COMMUNITY): Payer: Self-pay

## 2023-11-11 ENCOUNTER — Ambulatory Visit: Payer: Self-pay | Admitting: Family Medicine

## 2023-11-11 NOTE — Telephone Encounter (Signed)
 Copied from CRM [id number]. Topic: Clinical - Red Word Triage >> Nov 11, 2023  3:57 PM [place] J wrote: [facility] that prompted transfer to Nurse Triage: Hightened anxiety, heart palpitations, left leg pain scaled at 9/10, ride side of neck feels stiff.   Chief Complaint: Heart palpitations x 2 weeks Symptoms: anxiety, leg pain, and neck pain Frequency: intermittent Pertinent Negatives: Patient denies chest pain Disposition: [] ED /[] Urgent Care (no appt availability in office) / [x] Appointment(In office/virtual)/ []  [facility]/ [] Home Care/ [] Refused Recommended Disposition /[] [place] [facility]/ []  Follow-up with PCP Additional Notes: Patient reports "fast and hard" heart beat x 2-3 weeks. States that she feels better after taking deep breaths and taking 4x81 mg Aspirin. HR currently 71 while on this call, BP is 160/98 but she hasn't taken her BP medication today. Pt sts that she thinks the symptoms may be related to her BP medication but also endorses taking Lysine and Zinc around the time the symptoms started. RN advising pt to stop taking the new supplements and to take her BP meds. Care advice also provided and appt scheduled for tomorrow at 4:20p  Reason for Disposition  [1] Palpitations AND [2] no improvement after using Care Advice  Answer Assessment - Initial Assessment Questions 1. DESCRIPTION: "Please describe your heart rate or heartbeat that you are having" (e.g., fast/slow, regular/irregular, skipped or extra beats, "palpitations")     Heart rate is beating fast and hard and is making body shake  2. ONSET: "When did it start?" (Minutes, hours or days)      Started several weeks ago, but comes and goes  3. DURATION: "How long does it last" (e.g., seconds, minutes, hours)     30-40 min, but gets better when takikng deep breaths  4. PATTERN "Does it come and go, or has it been constant since it started?"  "Does it get worse with exertion?"   "Are you feeling it now?"      Comes and goes, doesn't feel it now.  5. TAP: "Using your hand, can you tap out what you are feeling on a chair or table in front of you, so that I can hear?" (Note: not all patients can do this)       . 6. HEART RATE: "Can you tell me your heart rate?" "How many beats in 15 seconds?"  (Note: not all patients can do this)       HR 71, BP 160/98  7. RECURRENT SYMPTOM: "Have you ever had this before?" If Yes, ask: "When was the last time?" and "What happened that time?"      Feels stronger when taking BP medication  8. CAUSE: "What do you think is causing the palpitations?"     Thinks it may be related to BP medication or anxiety  9. CARDIAC HISTORY: "Do you have any history of heart disease?" (e.g., heart attack, angina, bypass surgery, angioplasty, arrhythmia)      HTN,   10. OTHER SYMPTOMS: "Do you have any other symptoms?" (e.g., dizziness, chest pain, sweating, difficulty breathing)       Anxious, neck pain since last night, leg pain  11. PREGNANCY: "Is there any chance you are pregnant?" "When was your last menstrual period?"       No  Protocols used: Heart Rate and Heartbeat Questions-A-AH

## 2023-11-12 ENCOUNTER — Ambulatory Visit: Admitting: Nurse Practitioner

## 2023-11-12 ENCOUNTER — Ambulatory Visit: Attending: Nurse Practitioner

## 2023-11-12 VITALS — BP 136/84 | HR 74 | Temp 98.3°F | Ht 69.0 in | Wt 300.0 lb

## 2023-11-12 DIAGNOSIS — R002 Palpitations: Secondary | ICD-10-CM

## 2023-11-12 DIAGNOSIS — M1A9XX0 Chronic gout, unspecified, without tophus (tophi): Secondary | ICD-10-CM

## 2023-11-12 DIAGNOSIS — R0602 Shortness of breath: Secondary | ICD-10-CM

## 2023-11-12 DIAGNOSIS — R1011 Right upper quadrant pain: Secondary | ICD-10-CM | POA: Diagnosis not present

## 2023-11-12 NOTE — Progress Notes (Signed)
 Established Patient Office Visit  Subjective   Patient ID: Ashley Davis, female    DOB: May 17, 1966  Age: 58 y.o. MRN: [id number]  Chief Complaint  Patient presents with   Palpitations    Patient has today for acute visit for the above.  Reports has been having cardiac palpitations intermittently for 1 month.  They occur 4 times a week.  She has associated shortness of breath and chest pressure when they occur as well as tremors.  She will take aspirin 81 mg x 4 and practice deep breathing when they occur which will result in improvement in symptoms.  She reports history of anxiety but is not currently taking medication to control this.  Pertinent past medical history also positive for migraine, hypertension, asthma, GERD lumbar radiculopathy, obesity, hyperlipidemia.   She denies being a current smoker, has been on hormone replacement therapy in the past but this was discontinued after she started experiencing postmenopausal vaginal bleeding.  She reports she is preparing to undergo exploratory surgery to rule out cancer.  She is concerned symptoms may be related to pulmonary embolism.  She also reports pain in her left knee that will often start posterior and then radiate down the lateral side of her calf.  She is concerned about possible DVT.  She reports that she has had Doppler ultrasounds in the past have been negative for DVT.  She reports history of gout and has been experiencing more frequent gouty flares.  Flares usually occur in her feet.  She has also been experiencing right upper quadrant pain that is intermittent and will sometimes radiate around to her back.  She does drink but socially and only occasionally.  She has noticed that alcohol intake can sometimes trigger her palpitations.    ROS: see HPI    Objective:     BP 136/84   Pulse 74   Temp 98.3 F (36.8 C) (Temporal)   Ht 5\' 9"  (1.753 m)   Wt 300 lb (136.1 kg)   LMP 03/31/2017 (Exact Date)  Comment: neg preg test 04/07/17  SpO2 97%   BMI 44.30 kg/m  BP Readings from Last 3 Encounters:  11/12/23 136/84  08/19/23 (!) 141/80  08/13/23 136/79   Wt Readings from Last 3 Encounters:  11/12/23 300 lb (136.1 kg)  08/19/23 294 lb 3.2 oz (133.4 kg)  08/13/23 296 lb (134.3 kg)      Physical Exam Vitals reviewed.  Constitutional:      General: She is not in acute distress.    Appearance: Normal appearance.  HENT:     Head: Normocephalic and atraumatic.  Neck:     Vascular: No carotid bruit.  Cardiovascular:     Rate and Rhythm: Normal rate and regular rhythm.     Pulses: Normal pulses.     Heart sounds: Normal heart sounds.  Pulmonary:     Effort: Pulmonary effort is normal.     Breath sounds: Normal breath sounds.  Abdominal:     General: Abdomen is flat. Bowel sounds are normal.     Palpations: Abdomen is soft.     Tenderness: There is no abdominal tenderness.  Skin:    General: Skin is warm and dry.  Neurological:     General: No focal deficit present.     Mental Status: She is alert and oriented to person, place, and time.  Psychiatric:        Mood and Affect: Mood normal.        Behavior: Behavior  normal.        Judgment: Judgment normal.   EKG: Normal sinus rhythm without ST segment abnormalities identified   No results found for any visits on 11/12/23.    The ASCVD Risk score (Arnett DK, et al., 2019) failed to calculate for the following reasons:   Risk score cannot be calculated because patient has a medical history suggesting prior/existing ASCVD    Assessment & Plan:   Problem List Items Addressed This Visit       Other   RUQ abdominal pain - Primary   Etiology unclear, exam benign Will check lipase level Will check ultrasound of right upper quadrant for further evaluation, will also check urine to rule out UTI. Further recommendations may be made based upon these results.      Relevant Orders   Comprehensive metabolic panel   Lipase    CBC   TSH   LONG TERM MONITOR (3-14 DAYS)   T3, free   T4, free   Ambulatory referral to Cardiology   Urine Culture   Urinalysis, Routine w reflex microscopic   Uric acid   US Abdomen Limited RUQ (LIVER/GB)   CT Angio Chest W/Cm &/Or Wo Cm   D-dimer, quantitative   Palpitations   Acute, intermittent, associate with cardiac palpitations Labs ordered for further evaluation Will collect D-dimer as part of lab work if positive patient will be called and told to go to the ER emergently to rule out PE and possible DVT. Will order long-term cardiac monitor to check for arrhythmias.  Will also refer to cardiology for for assistance with evaluation. Unfortunately at time of visit lab is already closed, the patient will return to lab tomorrow morning to have these completed.  Further recommendations will be made as workup is completed. She was told if she has an additional episode between today and tomorrow she needs to go to the ER for evaluation, she reports her understanding.      Relevant Orders   Comprehensive metabolic panel   Lipase   CBC   TSH   LONG TERM MONITOR (3-14 DAYS)   T3, free   T4, free   Ambulatory referral to Cardiology   Urine Culture   Urinalysis, Routine w reflex microscopic   Uric acid   US Abdomen Limited RUQ (LIVER/GB)   CT Angio Chest W/Cm &/Or Wo Cm   D-dimer, quantitative   Shortness of breath   Acute, intermittent, associate with cardiac palpitations Labs ordered for further evaluation Will collect D-dimer as part of lab work if positive patient will be called and told to go to the ER emergently to rule out PE and possible DVT. Will order long-term cardiac monitor to check for arrhythmias.  Will also refer to cardiology for for assistance with evaluation. Unfortunately at time of visit lab is already closed, the patient will return to lab tomorrow morning to have these completed.  Further recommendations will be made as workup is completed. She was  told if she has an additional episode between today and tomorrow she needs to go to the ER for evaluation, she reports her understanding.       Relevant Orders   Comprehensive metabolic panel   Lipase   CBC   TSH   LONG TERM MONITOR (3-14 DAYS)   T3, free   T4, free   Ambulatory referral to Cardiology   Urine Culture   Urinalysis, Routine w reflex microscopic   Uric acid   US Abdomen Limited RUQ (LIVER/GB)  CT Angio Chest W/Cm &/Or Wo Cm   D-dimer, quantitative   Chronic gout without tophus   Chronic Not currently in an active flare Check uric acid, pending results may consider agent to lower uric acid levels to reduce gouty flares.      Relevant Orders   Comprehensive metabolic panel   Lipase   CBC   TSH   LONG TERM MONITOR (3-14 DAYS)   T3, free   T4, free   Ambulatory referral to Cardiology   Urine Culture   Urinalysis, Routine w reflex microscopic   Uric acid   US Abdomen Limited RUQ (LIVER/GB)   CT Angio Chest W/Cm &/Or Wo Cm   D-dimer, quantitative    Return for f/u with PCP as scheduled.    Elenore Paddy, NP

## 2023-11-12 NOTE — Progress Notes (Unsigned)
 EP to read.

## 2023-11-12 NOTE — Assessment & Plan Note (Signed)
 Etiology unclear, exam benign Will check lipase level Will check ultrasound of right upper quadrant for further evaluation, will also check urine to rule out UTI. Further recommendations may be made based upon these results.

## 2023-11-12 NOTE — Assessment & Plan Note (Signed)
 Chronic Not currently in an active flare Check uric acid, pending results may consider agent to lower uric acid levels to reduce gouty flares.

## 2023-11-12 NOTE — Assessment & Plan Note (Signed)
 Acute, intermittent, associate with cardiac palpitations Labs ordered for further evaluation Will collect D-dimer as part of lab work if positive patient will be called and told to go to the ER emergently to rule out PE and possible DVT. Will order long-term cardiac monitor to check for arrhythmias.  Will also refer to cardiology for for assistance with evaluation. Unfortunately at time of visit lab is already closed, the patient will return to lab tomorrow morning to have these completed.  Further recommendations will be made as workup is completed. She was told if she has an additional episode between today and tomorrow she needs to go to the ER for evaluation, she reports her understanding.

## 2023-11-13 ENCOUNTER — Encounter: Payer: Self-pay | Admitting: Nurse Practitioner

## 2023-11-13 ENCOUNTER — Telehealth: Payer: Self-pay | Admitting: Family Medicine

## 2023-11-13 ENCOUNTER — Ambulatory Visit
Admission: RE | Admit: 2023-11-13 | Discharge: 2023-11-13 | Disposition: A | Discharge status: Home / Self Care | Source: Ambulatory Visit | Attending: Nurse Practitioner | Admitting: Nurse Practitioner

## 2023-11-13 DIAGNOSIS — R1011 Right upper quadrant pain: Secondary | ICD-10-CM

## 2023-11-13 DIAGNOSIS — R002 Palpitations: Secondary | ICD-10-CM | POA: Diagnosis not present

## 2023-11-13 DIAGNOSIS — M1A9XX0 Chronic gout, unspecified, without tophus (tophi): Secondary | ICD-10-CM | POA: Diagnosis not present

## 2023-11-13 DIAGNOSIS — R0602 Shortness of breath: Secondary | ICD-10-CM | POA: Diagnosis not present

## 2023-11-13 LAB — CBC
HCT: 37.3 % (ref 36.0–46.0)
Hemoglobin: 12.5 g/dL (ref 12.0–15.0)
MCHC: 33.4 g/dL (ref 30.0–36.0)
MCV: 91.1 fl (ref 78.0–100.0)
Platelets: 250 10*3/uL (ref 150.0–400.0)
RBC: 4.1 Mil/uL (ref 3.87–5.11)
RDW: 13.8 % (ref 11.5–15.5)
WBC: 4.7 10*3/uL (ref 4.0–10.5)

## 2023-11-13 LAB — TSH: TSH: 2.43 u[IU]/mL (ref 0.35–5.50)

## 2023-11-13 LAB — COMPREHENSIVE METABOLIC PANEL
ALT: 10 U/L (ref 0–35)
AST: 15 U/L (ref 0–37)
Albumin: 4.1 g/dL (ref 3.5–5.2)
Alkaline Phosphatase: 91 U/L (ref 39–117)
BUN: 12 mg/dL (ref 6–23)
CO2: 28 meq/L (ref 19–32)
Calcium: 9.4 mg/dL (ref 8.4–10.5)
Chloride: 103 meq/L (ref 96–112)
Creatinine, Ser: 0.6 mg/dL (ref 0.40–1.20)
GFR: 100.26 mL/min (ref >60.00)
Glucose, Bld: 110 mg/dL — ABNORMAL HIGH (ref 70–99)
Potassium: 3.5 meq/L (ref 3.5–5.1)
Sodium: 140 meq/L (ref 135–145)
Total Bilirubin: 0.6 mg/dL (ref 0.2–1.2)
Total Protein: 7.6 g/dL (ref 6.0–8.3)

## 2023-11-13 LAB — URINALYSIS, ROUTINE W REFLEX MICROSCOPIC
Bilirubin Urine: NEGATIVE
Hgb urine dipstick: NEGATIVE
Ketones, ur: NEGATIVE
Leukocytes,Ua: NEGATIVE
Nitrite: NEGATIVE
RBC / HPF: NONE SEEN (ref 0–?)
Specific Gravity, Urine: 1.015 (ref 1.000–1.030)
Total Protein, Urine: NEGATIVE
Urine Glucose: NEGATIVE
Urobilinogen, UA: 0.2 (ref 0.0–1.0)
pH: 6 (ref 5.0–8.0)

## 2023-11-13 LAB — URIC ACID: Uric Acid, Serum: 4.7 mg/dL (ref 2.4–7.0)

## 2023-11-13 LAB — LIPASE: Lipase: 19 U/L (ref 11.0–59.0)

## 2023-11-13 LAB — T3, FREE: T3, Free: 3.7 pg/mL (ref 2.3–4.2)

## 2023-11-13 LAB — T4, FREE: Free T4: 0.98 ng/dL (ref 0.60–1.60)

## 2023-11-13 MED ORDER — IOPAMIDOL (ISOVUE-370) INJECTION 76%
100.0000 mL | Freq: Once | INTRAVENOUS | Status: AC | PRN
  Administered 2023-11-13: 75 mL via INTRAVENOUS

## 2023-11-13 NOTE — Telephone Encounter (Signed)
 Please call patient concerning the CT scan

## 2023-11-13 NOTE — Telephone Encounter (Signed)
 Pt had no issue

## 2023-11-14 LAB — D-DIMER, QUANTITATIVE: D-Dimer, Quant: 0.78 ug{FEU}/mL — ABNORMAL HIGH (ref <0.50)

## 2023-11-14 LAB — URINE CULTURE: Result:: NO GROWTH

## 2023-11-16 ENCOUNTER — Encounter: Payer: Self-pay | Admitting: Nurse Practitioner

## 2023-11-18 ENCOUNTER — Other Ambulatory Visit: Payer: Self-pay | Admitting: Nurse Practitioner

## 2023-11-18 ENCOUNTER — Ambulatory Visit: Payer: Self-pay

## 2023-11-18 DIAGNOSIS — R7989 Other specified abnormal findings of blood chemistry: Secondary | ICD-10-CM

## 2023-11-18 NOTE — Telephone Encounter (Signed)
 Called and notified pt Ashley Davis has put in an order for the [place] of leg and advised her it could take a while for them to authorize and schedule her which was why she was advised to go to ED. Pt states she would like to go outpatient but would wait until tomorrow if she doesn't get a call will go to ED

## 2023-11-18 NOTE — Telephone Encounter (Signed)
 Copied from CRM [id number]. Topic: Clinical - Lab/Test Results >> Nov 18, 2023  2:24 PM Isabell A wrote: Reason for CRM: Patient would like to speak with a nurse in regard to her lab results.   Patient received a call about lab results today. I have shared the results with the patient as well as the recommendation to go to the ED for ultrasound imaging of her legs. Patient would like to see if an order for an outpatient ultrasounds could be made so she can avoid waiting in the ED. [facility] called and was informed that an ultrasound order could take up to 30 days for approval which is why the recommendation is to go to the ED.   I attempted to call the patient but but got the response "call could not be completed at this time." Please contact the patient with an update.    Reason for Disposition  Caller requesting lab results  (Exception: Routine or non-urgent lab result.)  Answer Assessment - Initial Assessment Questions 1. REASON FOR CALL or QUESTION: "What is your reason for calling today?" or "How can I best help you?" or "What question do you have that I can help answer?"     Lab results  2. CALLER: Document the source of call. (e.g., laboratory, patient).     Patient  Protocols used: PCP Call - No Triage-A-AH

## 2023-11-19 ENCOUNTER — Other Ambulatory Visit: Payer: Self-pay | Admitting: Nurse Practitioner

## 2023-11-19 ENCOUNTER — Encounter: Payer: Self-pay | Admitting: Nurse Practitioner

## 2023-11-19 ENCOUNTER — Ambulatory Visit
Admission: RE | Admit: 2023-11-19 | Discharge: 2023-11-19 | Discharge status: Home / Self Care | Source: Ambulatory Visit | Attending: Nurse Practitioner | Admitting: Nurse Practitioner

## 2023-11-19 DIAGNOSIS — M1A9XX0 Chronic gout, unspecified, without tophus (tophi): Secondary | ICD-10-CM

## 2023-11-19 DIAGNOSIS — R1011 Right upper quadrant pain: Secondary | ICD-10-CM

## 2023-11-19 DIAGNOSIS — R002 Palpitations: Secondary | ICD-10-CM

## 2023-11-19 DIAGNOSIS — M7989 Other specified soft tissue disorders: Secondary | ICD-10-CM | POA: Diagnosis not present

## 2023-11-19 DIAGNOSIS — R7989 Other specified abnormal findings of blood chemistry: Secondary | ICD-10-CM

## 2023-11-19 DIAGNOSIS — R0602 Shortness of breath: Secondary | ICD-10-CM

## 2023-11-26 NOTE — Addendum Note (Signed)
 Addended by: Cathleen Fears, Young Berry on: 11/26/2023 04:15 PM   Modules accepted: Orders

## 2023-11-30 ENCOUNTER — Telehealth: Payer: Self-pay | Admitting: Oncology

## 2023-11-30 ENCOUNTER — Telehealth: Payer: Self-pay | Admitting: *Deleted

## 2023-11-30 NOTE — Telephone Encounter (Signed)
 Spoke with Ashley Davis who called the office to cancel her phone appt. With Dr. Pricilla Holm and her surgery on 12/16/23. Pt didn't comment why and declines to reschedule at this time.

## 2023-11-30 NOTE — Telephone Encounter (Signed)
 Left a message regarding scheduling a telephone visit with Dr. Pricilla Holm.  Requested a return call.

## 2023-12-02 NOTE — Telephone Encounter (Signed)
 Left another message to set up a telephone apt with Dr. Pricilla Holm.  Requested a return call.

## 2023-12-04 DIAGNOSIS — R0602 Shortness of breath: Secondary | ICD-10-CM | POA: Diagnosis not present

## 2023-12-04 DIAGNOSIS — R002 Palpitations: Secondary | ICD-10-CM | POA: Diagnosis not present

## 2023-12-05 DIAGNOSIS — R002 Palpitations: Secondary | ICD-10-CM | POA: Diagnosis not present

## 2023-12-05 DIAGNOSIS — R0602 Shortness of breath: Secondary | ICD-10-CM | POA: Diagnosis not present

## 2023-12-07 ENCOUNTER — Encounter: Payer: Self-pay | Admitting: Nurse Practitioner

## 2023-12-10 ENCOUNTER — Telehealth: Payer: Commercial Managed Care - PPO | Admitting: Gynecologic Oncology

## 2023-12-11 ENCOUNTER — Ambulatory Visit: Payer: Commercial Managed Care - PPO | Admitting: Family Medicine

## 2023-12-14 ENCOUNTER — Ambulatory Visit: Admitting: Family Medicine

## 2023-12-16 ENCOUNTER — Ambulatory Visit (HOSPITAL_COMMUNITY): Admit: 2023-12-16 | Payer: Commercial Managed Care - PPO | Admitting: Gynecologic Oncology

## 2023-12-16 DIAGNOSIS — N950 Postmenopausal bleeding: Secondary | ICD-10-CM

## 2023-12-16 SURGERY — DILATATION & CURETTAGE/HYSTEROSCOPY WITH MYOSURE
Anesthesia: Choice

## 2024-01-06 ENCOUNTER — Other Ambulatory Visit (HOSPITAL_COMMUNITY): Payer: Self-pay

## 2024-01-06 ENCOUNTER — Other Ambulatory Visit: Payer: Self-pay | Admitting: Family Medicine

## 2024-01-06 ENCOUNTER — Other Ambulatory Visit: Payer: Self-pay

## 2024-01-06 DIAGNOSIS — E876 Hypokalemia: Secondary | ICD-10-CM

## 2024-01-06 MED ORDER — POTASSIUM CHLORIDE CRYS ER 10 MEQ PO TBCR
10.0000 meq | EXTENDED_RELEASE_TABLET | Freq: Every day | ORAL | 1 refills | Status: DC
  Filled 2024-01-06: qty 30, 30d supply, fill #0
  Filled 2024-01-08 – 2024-02-16 (×2): qty 30, 30d supply, fill #1

## 2024-01-08 ENCOUNTER — Other Ambulatory Visit (HOSPITAL_COMMUNITY): Payer: Self-pay

## 2024-01-15 ENCOUNTER — Ambulatory Visit: Admitting: Physician Assistant

## 2024-01-18 ENCOUNTER — Other Ambulatory Visit: Payer: Self-pay | Admitting: Family Medicine

## 2024-01-18 ENCOUNTER — Other Ambulatory Visit (HOSPITAL_COMMUNITY): Payer: Self-pay

## 2024-01-18 DIAGNOSIS — I10 Essential (primary) hypertension: Secondary | ICD-10-CM

## 2024-01-18 MED ORDER — OLMESARTAN MEDOXOMIL-HCTZ 40-12.5 MG PO TABS
1.0000 | ORAL_TABLET | Freq: Every day | ORAL | 0 refills | Status: DC
  Filled 2024-01-18: qty 90, 90d supply, fill #0

## 2024-01-22 ENCOUNTER — Ambulatory Visit (INDEPENDENT_AMBULATORY_CARE_PROVIDER_SITE_OTHER): Admitting: Family Medicine

## 2024-01-22 ENCOUNTER — Other Ambulatory Visit: Payer: Self-pay

## 2024-01-22 ENCOUNTER — Other Ambulatory Visit (HOSPITAL_COMMUNITY): Payer: Self-pay

## 2024-01-22 ENCOUNTER — Encounter: Payer: Self-pay | Admitting: Family Medicine

## 2024-01-22 VITALS — BP 122/82 | HR 85 | Temp 97.8°F | Ht 69.0 in | Wt 300.0 lb

## 2024-01-22 DIAGNOSIS — R002 Palpitations: Secondary | ICD-10-CM | POA: Diagnosis not present

## 2024-01-22 DIAGNOSIS — Z87891 Personal history of nicotine dependence: Secondary | ICD-10-CM | POA: Diagnosis not present

## 2024-01-22 DIAGNOSIS — F419 Anxiety disorder, unspecified: Secondary | ICD-10-CM

## 2024-01-22 DIAGNOSIS — R911 Solitary pulmonary nodule: Secondary | ICD-10-CM | POA: Diagnosis not present

## 2024-01-22 DIAGNOSIS — I10 Essential (primary) hypertension: Secondary | ICD-10-CM

## 2024-01-22 DIAGNOSIS — F32A Depression, unspecified: Secondary | ICD-10-CM

## 2024-01-22 DIAGNOSIS — F4321 Adjustment disorder with depressed mood: Secondary | ICD-10-CM

## 2024-01-22 MED ORDER — SERTRALINE HCL 25 MG PO TABS
25.0000 mg | ORAL_TABLET | Freq: Every day | ORAL | 2 refills | Status: DC
  Filled 2024-01-22 (×2): qty 30, 30d supply, fill #0
  Filled 2024-02-16: qty 30, 30d supply, fill #1

## 2024-01-22 MED ORDER — ALPRAZOLAM 0.25 MG PO TABS
0.2500 mg | ORAL_TABLET | Freq: Two times a day (BID) | ORAL | 0 refills | Status: DC | PRN
  Filled 2024-01-22 (×2): qty 20, 10d supply, fill #0

## 2024-01-22 NOTE — Progress Notes (Unsigned)
 Subjective:     Patient ID: Ashley Davis, female    DOB: April 10, 1966, 58 y.o.   MRN: [id number]  Chief Complaint  Patient presents with   Results    Wants to go over lab results from Adella Agee  Wants to go over sleep study results as well   Obesity    Discuss weight management    HPI  History of Present Illness         Here with concerns regarding anxiety.  States she thinks she is having panic attacks.   She does not drive due to anxiety and has not since February 22, 2014. Her mother died in 48.  She has had a longtime fear of driving. Her mother used to help her with being ok driving.   Recent visit with Adella Agee for palpitations and RUQ pain.  She had a positive D-dimer and was sent for CTA chest.  Negative for DVT and PE  IMPRESSION: 1. No evidence for pulmonary embolism. 2. No acute chest abnormality. 3. Pulmonary nodule within the left upper lobe measuring 5 mm. No follow-up needed if patient is low-risk. Non-contrast chest CT can be considered in 12 months if patient is high-risk. This recommendation follows the consensus statement: Guidelines for Management of Incidental Pulmonary Nodules Detected on CT Images: From the Fleischner Society February 23, 2016; Radiology 02-23-2016; [id number]. 4. Probable small hiatal hernia.   Electronically Signed   By: Elene Griffes M.D.   On: 11/13/2023 15:48  RUQ US  was also done and as follows:   IMPRESSION: Heterogeneous hepatic parenchymal echogenicity with poor acoustic penetration. This is nonspecific but can be seen in the setting of hepatic steatosis or hepatocellular disease.   Electronically Signed   By: Clancy Crimes M.D.   On: 11/19/2023 12:06    Former smoker. Stopped in 02-22-2017.  Light smoker for 30 years.       01/22/2024    2:19 PM 11/20/2022    2:53 PM 07/23/2022   11:34 AM  Depression screen PHQ 2/9  Decreased Interest 2 0 0  Down, Depressed, Hopeless 2 0 0  PHQ - 2 Score 4 0 0  Altered sleeping 3     Tired, decreased energy 3    Change in appetite 3    Feeling bad or failure about yourself  2    Trouble concentrating 0    Moving slowly or fidgety/restless 0    Suicidal thoughts 0    PHQ-9 Score 15    Difficult doing work/chores Not difficult at all        Health Maintenance Due  Topic Date Due   HIV Screening  Never done   Hepatitis C Screening  Never done   Pneumococcal Vaccine 65-76 Years old (2 of 2 - PCV) 04/26/2015   Zoster Vaccines- Shingrix (1 of 2) Never done   MAMMOGRAM  10/16/2020    Past Medical History:  Diagnosis Date   Allergy    Anemia    Anxiety    Arthritis    Asthma    Depression    GERD (gastroesophageal reflux disease)    History of bronchitis    Hyperlipidemia    Hypertension    Pt states her HTN is due to her migraines   Migraine    PONV (postoperative nausea and vomiting)    Tonsil stone 04/25/2019    Past Surgical History:  Procedure Laterality Date   BREAST BIOPSY     COLONOSCOPY  11/17/2022   IR ANGIOGRAM PELVIS SELECTIVE  OR SUPRASELECTIVE  04/07/2017   IR ANGIOGRAM PELVIS SELECTIVE OR SUPRASELECTIVE  04/07/2017   IR ANGIOGRAM SELECTIVE EACH ADDITIONAL VESSEL  04/07/2017   IR ANGIOGRAM SELECTIVE EACH ADDITIONAL VESSEL  04/07/2017   IR ANGIOGRAM VISCERAL SELECTIVE  04/07/2017   IR EMBO TUMOR ORGAN ISCHEMIA INFARCT INC GUIDE ROADMAPPING  04/07/2017   IR RADIOLOGIST EVAL & MGMT  01/01/2017   IR RADIOLOGIST EVAL & MGMT  04/30/2017   IR US  GUIDE VASC ACCESS RIGHT  04/07/2017    Family History  Problem Relation Age of Onset   Cancer Mother        "endometrioisis"   Diabetes Mother    Stroke Mother    Headache Mother    Diabetes Father    Other Father        neuropathy   Headache Sister    Aneurysm Sister        3 brain surgeries   Stomach cancer Maternal Grandmother    Cancer Other    Colon cancer Neg Hx    Esophageal cancer Neg Hx    Rectal cancer Neg Hx    Ovarian cancer Neg Hx    Pancreatic cancer Neg Hx     Prostate cancer Neg Hx    Breast cancer Neg Hx     Social History   Socioeconomic History   Marital status: Married    Spouse name: Darren   Number of children: 2   Years of education: Nature conservation officer education level: Associate degree: occupational, Scientist, product/process development, or vocational program  Occupational History    Comment: Med Tech, [employer]   Occupation: [employer]  Tobacco Use   Smoking status: Former    Current packs/day: 0.00    Average packs/day: 0.3 packs/day for 30.0 years (7.5 ttl pk-yrs)    Types: Cigarettes    Start date: 04/07/1987    Quit date: 04/06/2017    Years since quitting: 6.8   Smokeless tobacco: Never  Vaping Use   Vaping status: Never Used  Substance and Sexual Activity   Alcohol use: Yes    Comment: occasional   Drug use: No   Sexual activity: Yes    Birth control/protection: Post-menopausal  Other Topics Concern   Not on file  Social History Narrative   Patient lives at home with family.   Caffeine  Use: 1 cup 2-3 times weekly   Social Drivers of Health   Financial Resource Strain: Low Risk  (11/12/2023)   Overall Financial Resource Strain (CARDIA)    Difficulty of Paying Living Expenses: Not hard at all  Food Insecurity: No Food Insecurity (11/12/2023)   Hunger Vital Sign    Worried About Running Out of Food in the Last Year: Never true    Ran Out of Food in the Last Year: Never true  Transportation Needs: No Transportation Needs (11/12/2023)   PRAPARE - Administrator, Civil Service (Medical): No    Lack of Transportation (Non-Medical): No  Physical Activity: Insufficiently Active (11/12/2023)   Exercise Vital Sign    Days of Exercise per Week: 2 days    Minutes of Exercise per Session: 30 min  Stress: Stress Concern Present (11/12/2023)   [employer] of Occupational Health - Occupational Stress Questionnaire    Feeling of Stress : Very much  Social Connections: Moderately Integrated (11/12/2023)   Social Connection and  Isolation Panel [NHANES]    Frequency of Communication with Friends and Family: More than three times a week    Frequency  of Social Gatherings with Friends and Family: Once a week    Attends Religious Services: Never    Database administrator or Organizations: Yes    Attends Engineer, structural: 1 to 4 times per year    Marital Status: Married  Catering manager Violence: Not on file    Outpatient Medications Prior to Visit  Medication Sig Dispense Refill   acetaminophen  (TYLENOL ) 500 MG tablet Take 1,000 mg by mouth every 6 (six) hours as needed for headache.     acetaminophen  (TYLENOL ) 650 MG CR tablet Take 1,300 mg by mouth every 8 (eight) hours as needed for pain (Arthritis).     aspirin  EC 81 MG tablet Take 81 mg by mouth daily. Swallow whole.     fluticasone  (FLONASE ) 50 MCG/ACT nasal spray Place 2 sprays into both nostrils daily until directed to stop. (Patient taking differently: Place 2 sprays into both nostrils daily as needed for allergies.) 48 g 3   loratadine  (CLARITIN ) 10 MG tablet Take 1 tablet (10 mg total) by mouth daily. 90 tablet 0   Magnesium Oxide 240 MG PACK Take 480 mg by mouth daily.     Naphazoline-Pheniramine (EYE ALLERGY RELIEF OP) Place 1 drop into both eyes daily as needed (itchy eyes).     olmesartan -hydrochlorothiazide  (BENICAR  HCT) 40-12.5 MG tablet Take 1 tablet by mouth daily. 90 tablet 0   omeprazole (PRILOSEC) 20 MG capsule Take 40 mg by mouth daily as needed (Heartburn).     OVER THE COUNTER MEDICATION Take 1 tablet by mouth daily. Pro biotic     OVER THE COUNTER MEDICATION Take 600 mg by mouth daily. Calcium     potassium chloride  (KLOR-CON  M) 10 MEQ tablet Take 1 tablet (10 mEq total) by mouth daily. Please schedule appointment with [facility] for refills. 30 tablet 1   Cholecalciferol (VITAMIN D3) 125 MCG (5000 UT) CAPS Take 10,000 Units by mouth 3 (three) times a week. (Patient not taking: Reported on 01/22/2024)     senna-docusate (SENOKOT-S)  8.6-50 MG tablet Take 2 tablets by mouth at bedtime. For AFTER surgery, do not take if having diarrhea (Patient not taking: Reported on 01/22/2024) 30 tablet 0   meloxicam  (MOBIC ) 15 MG tablet Take 1 tablet (15 mg total) by mouth daily. (Patient not taking: Reported on 01/22/2024) 30 tablet 0   No facility-administered medications prior to visit.    Allergies  Allergen Reactions   Augmentin  [Amoxicillin -Pot Clavulanate] Palpitations    Has patient had a PCN reaction causing immediate rash, facial/tongue/throat swelling, SOB or lightheadedness with hypotension: yes, Heart palpitations and chest pressure Has patient had a PCN reaction causing severe rash involving mucus membranes or skin necrosis: no Has patient had a PCN reaction that required hospitalization: no Has patient had a PCN reaction occurring within the last 10 years: yes If all of the above answers are "NO", then may proceed with Cephalosporin use.   Shellfish Allergy Hives    Review of Systems  Constitutional:  Positive for malaise/fatigue. Negative for chills and fever.  Respiratory:  Negative for shortness of breath.   Cardiovascular:  Positive for palpitations. Negative for chest pain and leg swelling.  Gastrointestinal:  Negative for abdominal pain, constipation, diarrhea, nausea and vomiting.  Genitourinary:  Negative for dysuria, frequency and urgency.  Musculoskeletal:  Positive for joint pain.  Neurological:  Negative for dizziness, focal weakness and headaches.  Psychiatric/Behavioral:  Positive for depression. Negative for substance abuse and suicidal ideas. The patient is nervous/anxious. The patient  does not have insomnia.        Objective:     Physical Exam Constitutional:      General: She is not in acute distress.    Appearance: She is not ill-appearing.  Eyes:     Extraocular Movements: Extraocular movements intact.     Conjunctiva/sclera: Conjunctivae normal.  Cardiovascular:     Rate and Rhythm:  Normal rate.  Pulmonary:     Effort: Pulmonary effort is normal.  Musculoskeletal:     Cervical back: Normal range of motion and neck supple.  Skin:    General: Skin is warm and dry.  Neurological:     General: No focal deficit present.     Mental Status: She is alert and oriented to person, place, and time.  Psychiatric:        Mood and Affect: Mood normal.        Behavior: Behavior normal.        Thought Content: Thought content normal.      BP 122/82 (BP Location: Left Arm, Patient Position: Sitting)   Pulse 85   Temp 97.8 F (36.6 C) (Temporal)   Ht 5\' 9"  (1.753 m)   Wt 300 lb (136.1 kg)   LMP 03/31/2017 (Exact Date) Comment: neg preg test 04/07/17  SpO2 98%   BMI 44.30 kg/m  Wt Readings from Last 3 Encounters:  01/22/24 300 lb (136.1 kg)  11/12/23 300 lb (136.1 kg)  08/19/23 294 lb 3.2 oz (133.4 kg)       Assessment & Plan:   Problem List Items Addressed This Visit     Anxiety and depression - Primary   Relevant Medications   sertraline (ZOLOFT) 25 MG tablet   ALPRAZolam (XANAX) 0.25 MG tablet   Other Relevant Orders   Ambulatory referral to Psychology   Former light cigarette smoker (1-9 per day)   Left upper lobe pulmonary nodule   Palpitations   Primary hypertension   Severe obesity (BMI >= 40) (HCC)   Unresolved grief   Relevant Orders   Ambulatory referral to Psychology    Reviewed notes and results from her visit with Adella Agee, NP and discussed findings with patient.  Her main concern today is anxiety and depression.  Start sertraline. Use alprazolam sparingly for severe anxiety.  Referral for counseling and she prefers [facility] forces training and education officer location.  Follow up here in approx 6 weeks.   I have discontinued Almina D. Kumagai's meloxicam . I am also having her start on sertraline and ALPRAZolam. Additionally, I am having her maintain her loratadine , fluticasone , OVER THE COUNTER MEDICATION, OVER THE COUNTER MEDICATION, aspirin  EC, senna-docusate,  Magnesium Oxide, Vitamin D3, omeprazole, acetaminophen , acetaminophen , Naphazoline-Pheniramine (EYE ALLERGY RELIEF OP), potassium chloride , and olmesartan -hydrochlorothiazide .  Meds ordered this encounter  Medications   sertraline (ZOLOFT) 25 MG tablet    Sig: Take 1 tablet (25 mg total) by mouth daily.    Dispense:  30 tablet    Refill:  2    Supervising Provider:   Bambi Lever A [[id number]]   ALPRAZolam (XANAX) 0.25 MG tablet    Sig: Take 1 tablet (0.25 mg total) by mouth 2 (two) times daily as needed for anxiety.    Dispense:  20 tablet    Refill:  0    Supervising Provider:   Bambi Lever A [[id number]]

## 2024-01-25 DIAGNOSIS — F4321 Adjustment disorder with depressed mood: Secondary | ICD-10-CM | POA: Insufficient documentation

## 2024-01-25 DIAGNOSIS — Z87891 Personal history of nicotine dependence: Secondary | ICD-10-CM | POA: Insufficient documentation

## 2024-01-25 DIAGNOSIS — R911 Solitary pulmonary nodule: Secondary | ICD-10-CM | POA: Insufficient documentation

## 2024-01-26 ENCOUNTER — Other Ambulatory Visit (HOSPITAL_COMMUNITY): Payer: Self-pay

## 2024-02-11 NOTE — Progress Notes (Deleted)
 Cardiology Office Note:    Date:  02/11/2024   ID:  Ashley Davis, DOB May 30, 1966, MRN [id number]  PCP:  Abram Abraham, NP-C   [facility] Providers Cardiologist:  None { Click to update primary MD,subspecialty MD or APP then REFRESH:1}    Referring MD: Zorita Hiss, NP   No chief complaint on file. ***  History of Present Illness:    Ashley Davis is a 58 y.o. female is seen at the request of Adella Agee NP for evaluation of dyspnea and palpitations. She has a history of HTN and HLD.   Past Medical History:  Diagnosis Date   Allergy    Anemia    Anxiety    Arthritis    Asthma    Depression    GERD (gastroesophageal reflux disease)    History of bronchitis    Hyperlipidemia    Hypertension    Pt states her HTN is due to her migraines   Migraine    PONV (postoperative nausea and vomiting)    Tonsil stone 04/25/2019    Past Surgical History:  Procedure Laterality Date   BREAST BIOPSY     COLONOSCOPY  11/17/2022   IR ANGIOGRAM PELVIS SELECTIVE OR SUPRASELECTIVE  04/07/2017   IR ANGIOGRAM PELVIS SELECTIVE OR SUPRASELECTIVE  04/07/2017   IR ANGIOGRAM SELECTIVE EACH ADDITIONAL VESSEL  04/07/2017   IR ANGIOGRAM SELECTIVE EACH ADDITIONAL VESSEL  04/07/2017   IR ANGIOGRAM VISCERAL SELECTIVE  04/07/2017   IR EMBO TUMOR ORGAN ISCHEMIA INFARCT INC GUIDE ROADMAPPING  04/07/2017   IR RADIOLOGIST EVAL & MGMT  01/01/2017   IR RADIOLOGIST EVAL & MGMT  04/30/2017   IR US  GUIDE VASC ACCESS RIGHT  04/07/2017    Current Medications: No outpatient medications have been marked as taking for the 02/15/24 encounter (Appointment) with [place], Ladarion Munyon M, MD.     Allergies:   Augmentin  [amoxicillin -pot clavulanate] and Shellfish allergy   Social History   Socioeconomic History   Marital status: Married    Spouse name: Darren   Number of children: 2   Years of education: Nature conservation officer education level: Associate degree: occupational, Scientist, product/process development, or  vocational program  Occupational History    Comment: Med Tech, [employer]   Occupation: [place] - [employer]  Tobacco Use   Smoking status: Former    Current packs/day: 0.00    Average packs/day: 0.3 packs/day for 30.0 years (7.5 ttl pk-yrs)    Types: Cigarettes    Start date: 04/07/1987    Quit date: 04/06/2017    Years since quitting: 6.8   Smokeless tobacco: Never  Vaping Use   Vaping status: Never Used  Substance and Sexual Activity   Alcohol use: Yes    Comment: occasional   Drug use: No   Sexual activity: Yes    Birth control/protection: Post-menopausal  Other Topics Concern   Not on file  Social History Narrative   Patient lives at home with family.   Caffeine  Use: 1 cup 2-3 times weekly   Social Drivers of Health   Financial Resource Strain: Low Risk  (11/12/2023)   Overall Financial Resource Strain (CARDIA)    Difficulty of Paying Living Expenses: Not hard at all  Food Insecurity: No Food Insecurity (11/12/2023)   Hunger Vital Sign    Worried About Running Out of Food in the Last Year: Never true    Ran Out of Food in the Last Year: Never true  Transportation Needs: No Transportation Needs (11/12/2023)  PRAPARE - Administrator, Civil Service (Medical): No    Lack of Transportation (Non-Medical): No  Physical Activity: Insufficiently Active (11/12/2023)   Exercise Vital Sign    Days of Exercise per Week: 2 days    Minutes of Exercise per Session: 30 min  Stress: Stress Concern Present (11/12/2023)   [employer] of Occupational Health - Occupational Stress Questionnaire    Feeling of Stress : Very much  Social Connections: Moderately Integrated (11/12/2023)   Social Connection and Isolation Panel    Frequency of Communication with Friends and Family: More than three times a week    Frequency of Social Gatherings with Friends and Family: Once a week    Attends Religious Services: Never    Database administrator or Organizations: Yes    Attends Museum/gallery exhibitions officer: 1 to 4 times per year    Marital Status: Married     Family History: The patient's ***family history includes Aneurysm in her sister; Cancer in her mother and another family member; Diabetes in her father and mother; Headache in her mother and sister; Other in her father; Stomach cancer in her maternal grandmother; Stroke in her mother. There is no history of Colon cancer, Esophageal cancer, Rectal cancer, Ovarian cancer, Pancreatic cancer, Prostate cancer, or Breast cancer.  ROS:   Please see the history of present illness.    *** All other systems reviewed and are negative.  EKGs/Labs/Other Studies Reviewed:    The following studies were reviewed today: ***      Recent Labs: 11/13/2023: ALT 10; BUN 12; Creatinine, Ser 0.60; Hemoglobin 12.5; Platelets 250.0; Potassium 3.5; Sodium 140; TSH 2.43  Recent Lipid Panel    Component Value Date/Time   CHOL 154 06/12/2023 1456   TRIG 47.0 06/12/2023 1456   HDL 31.60 (L) 06/12/2023 1456   CHOLHDL 5 06/12/2023 1456   VLDL 9.4 06/12/2023 1456   LDLCALC 113 (H) 06/12/2023 1456     Risk Assessment/Calculations:   {Does this patient have ATRIAL FIBRILLATION?:[phone]}  No BP recorded.  {Refresh Note OR Click here to enter BP  :1}***         Physical Exam:    VS:  LMP 03/31/2017 (Exact Date) Comment: neg preg test 04/07/17    Wt Readings from Last 3 Encounters:  01/22/24 300 lb (136.1 kg)  11/12/23 300 lb (136.1 kg)  08/19/23 294 lb 3.2 oz (133.4 kg)     GEN: *** Well nourished, well developed in no acute distress HEENT: Normal NECK: No JVD; No carotid bruits LYMPHATICS: No lymphadenopathy CARDIAC: ***RRR, no murmurs, rubs, gallops RESPIRATORY:  Clear to auscultation without rales, wheezing or rhonchi  ABDOMEN: Soft, non-tender, non-distended MUSCULOSKELETAL:  No edema; No deformity  SKIN: Warm and dry NEUROLOGIC:  Alert and oriented x 3 PSYCHIATRIC:  Normal affect   ASSESSMENT:    No diagnosis  found. PLAN:    In order of problems listed above:  ***      {Are you ordering a CV Procedure (e.g. stress test, cath, DCCV, TEE, etc)?   Press F2        :[id number]}    Medication Adjustments/Labs and Tests Ordered: Current medicines are reviewed at length with the patient today.  Concerns regarding medicines are outlined above.  No orders of the defined types were placed in this encounter.  No orders of the defined types were placed in this encounter.   There are no Patient Instructions on file for this visit.  Signed, Luree Palla [place], MD  02/11/2024 5:09 PM    [facility]

## 2024-02-15 ENCOUNTER — Ambulatory Visit: Admitting: Cardiology

## 2024-02-16 ENCOUNTER — Other Ambulatory Visit (HOSPITAL_COMMUNITY): Payer: Self-pay

## 2024-02-17 NOTE — Progress Notes (Deleted)
 Ellouise Console, PA-C [address] [place], [place]  [address] Phone: [phone]   Primary Care Physician: Lendia Boby CROME, NP-C  Primary Gastroenterologist:  Ellouise Console, PA-C / Elspeth Naval, MD   Chief Complaint: Follow-up RUQ pain       HPI:   Ashley Davis is a 58 y.o. female presents for evaluation of right upper quadrant pain.  11/19/2023 RUQ ultrasound: Normal gallbladder with no gallstones.  CBD 2 mm.  Mild hepatic steatosis, otherwise no liver lesions.  11/13/2023 labs: Normal CBC, CMP, lipase, and TSH.  Normal LFTs.  Hgb 12.5.  11/13/2023 chest CTA Negative for PE.  Probable small hiatal hernia.  10/2022 colonoscopy by Dr. Naval: Patchy mild inflammation with erosions/erythema at the cecum and an ulcer at the ileocecal valve.  No polyps.  Good prep.  Sigmoid diverticulosis, small internal hemorrhoids.  Biopsies showed nonspecific inflammation, possibly due to NSAID use.  Repeat colonoscopy in 10 years.  Current symptoms:  Current Outpatient Medications  Medication Sig Dispense Refill   acetaminophen  (TYLENOL ) 500 MG tablet Take 1,000 mg by mouth every 6 (six) hours as needed for headache.     acetaminophen  (TYLENOL ) 650 MG CR tablet Take 1,300 mg by mouth every 8 (eight) hours as needed for pain (Arthritis).     ALPRAZolam  (XANAX ) 0.25 MG tablet Take 1 tablet (0.25 mg total) by mouth 2 (two) times daily as needed for anxiety. 20 tablet 0   aspirin  EC 81 MG tablet Take 81 mg by mouth daily. Swallow whole.     Cholecalciferol (VITAMIN D3) 125 MCG (5000 UT) CAPS Take 10,000 Units by mouth 3 (three) times a week. (Patient not taking: Reported on 01/22/2024)     fluticasone  (FLONASE ) 50 MCG/ACT nasal spray Place 2 sprays into both nostrils daily until directed to stop. (Patient taking differently: Place 2 sprays into both nostrils daily as needed for allergies.) 48 g 3   loratadine  (CLARITIN ) 10 MG tablet Take 1 tablet (10 mg total) by mouth daily. 90  tablet 0   Magnesium Oxide 240 MG PACK Take 480 mg by mouth daily.     Naphazoline-Pheniramine (EYE ALLERGY RELIEF OP) Place 1 drop into both eyes daily as needed (itchy eyes).     olmesartan -hydrochlorothiazide  (BENICAR  HCT) 40-12.5 MG tablet Take 1 tablet by mouth daily. 90 tablet 0   omeprazole (PRILOSEC) 20 MG capsule Take 40 mg by mouth daily as needed (Heartburn).     OVER THE COUNTER MEDICATION Take 1 tablet by mouth daily. Pro biotic     OVER THE COUNTER MEDICATION Take 600 mg by mouth daily. Calcium     potassium chloride  (KLOR-CON  M) 10 MEQ tablet Take 1 tablet (10 mEq total) by mouth daily. Please schedule appointment with Vickie for refills. 30 tablet 1   senna-docusate (SENOKOT-S) 8.6-50 MG tablet Take 2 tablets by mouth at bedtime. For AFTER surgery, do not take if having diarrhea (Patient not taking: Reported on 01/22/2024) 30 tablet 0   sertraline  (ZOLOFT ) 25 MG tablet Take 1 tablet (25 mg total) by mouth daily. 30 tablet 2   No current facility-administered medications for this visit.    Allergies as of 02/18/2024 - Review Complete 01/22/2024  Allergen Reaction Noted   Augmentin  [amoxicillin -pot clavulanate] Palpitations 09/20/2016   Shellfish allergy Hives 04/24/2014    Past Medical History:  Diagnosis Date   Allergy    Anemia    Anxiety    Arthritis    Asthma  Depression    GERD (gastroesophageal reflux disease)    History of bronchitis    Hyperlipidemia    Hypertension    Pt states her HTN is due to her migraines   Migraine    PONV (postoperative nausea and vomiting)    Tonsil stone 04/25/2019    Past Surgical History:  Procedure Laterality Date   BREAST BIOPSY     COLONOSCOPY  11/17/2022   IR ANGIOGRAM PELVIS SELECTIVE OR SUPRASELECTIVE  04/07/2017   IR ANGIOGRAM PELVIS SELECTIVE OR SUPRASELECTIVE  04/07/2017   IR ANGIOGRAM SELECTIVE EACH ADDITIONAL VESSEL  04/07/2017   IR ANGIOGRAM SELECTIVE EACH ADDITIONAL VESSEL  04/07/2017   IR ANGIOGRAM  VISCERAL SELECTIVE  04/07/2017   IR EMBO TUMOR ORGAN ISCHEMIA INFARCT INC GUIDE ROADMAPPING  04/07/2017   IR RADIOLOGIST EVAL & MGMT  01/01/2017   IR RADIOLOGIST EVAL & MGMT  04/30/2017   IR US  GUIDE VASC ACCESS RIGHT  04/07/2017    Review of Systems:    All systems reviewed and negative except where noted in HPI.    Physical Exam:  LMP 03/31/2017 (Exact Date) Comment: neg preg test 04/07/17 Patient's last menstrual period was 03/31/2017 (exact date).  General: Well-nourished, well-developed in no acute distress.  Lungs: Clear to auscultation bilaterally. Non-labored. Heart: Regular rate and rhythm, no murmurs rubs or gallops.  Abdomen: Bowel sounds are normal; Abdomen is Soft; No hepatosplenomegaly, masses or hernias;  No Abdominal Tenderness; No guarding or rebound tenderness. Neuro: Alert and oriented x 3.  Grossly intact.  Psych: Alert and cooperative, normal mood and affect.   Imaging Studies: No results found.  Labs: CBC    Component Value Date/Time   WBC 4.7 11/13/2023 1228   RBC 4.10 11/13/2023 1228   HGB 12.5 11/13/2023 1228   HCT 37.3 11/13/2023 1228   PLT 250.0 11/13/2023 1228   MCV 91.1 11/13/2023 1228   MCH 31.4 08/19/2023 1104   MCHC 33.4 11/13/2023 1228   RDW 13.8 11/13/2023 1228   LYMPHSABS 1.6 06/12/2023 1456   MONOABS 0.4 06/12/2023 1456   EOSABS 0.2 06/12/2023 1456   BASOSABS 0.0 06/12/2023 1456    CMP     Component Value Date/Time   NA 140 11/13/2023 1228   K 3.5 11/13/2023 1228   CL 103 11/13/2023 1228   CO2 28 11/13/2023 1228   GLUCOSE 110 (H) 11/13/2023 1228   BUN 12 11/13/2023 1228   CREATININE 0.60 11/13/2023 1228   CALCIUM 9.4 11/13/2023 1228   PROT 7.6 11/13/2023 1228   ALBUMIN 4.1 11/13/2023 1228   AST 15 11/13/2023 1228   ALT 10 11/13/2023 1228   ALKPHOS 91 11/13/2023 1228   BILITOT 0.6 11/13/2023 1228   GFRNONAA >60 08/19/2023 1100   GFRAA >60 12/14/2017 1451       Assessment and Plan:   Ashley Davis is a 58  y.o. y/o female presents for evaluation of:  1.  RUQ pain; recent RUQ ultrasound showed no gallstones.  CBC, CMP, lipase, and TSH labs were normal.    Ellouise Console, PA-C  Follow up ***

## 2024-02-18 ENCOUNTER — Ambulatory Visit: Admitting: Physician Assistant

## 2024-03-09 ENCOUNTER — Ambulatory Visit: Admitting: Family Medicine

## 2024-03-21 ENCOUNTER — Ambulatory Visit: Admitting: Family Medicine

## 2024-03-31 ENCOUNTER — Ambulatory Visit: Admitting: Family Medicine

## 2024-03-31 ENCOUNTER — Other Ambulatory Visit: Payer: Self-pay | Admitting: Family Medicine

## 2024-03-31 ENCOUNTER — Other Ambulatory Visit (HOSPITAL_BASED_OUTPATIENT_CLINIC_OR_DEPARTMENT_OTHER): Payer: Self-pay

## 2024-03-31 ENCOUNTER — Encounter: Payer: Self-pay | Admitting: Family Medicine

## 2024-03-31 ENCOUNTER — Other Ambulatory Visit: Payer: Self-pay

## 2024-03-31 VITALS — BP 116/76 | HR 80 | Temp 97.8°F | Ht 69.0 in

## 2024-03-31 DIAGNOSIS — E782 Mixed hyperlipidemia: Secondary | ICD-10-CM

## 2024-03-31 DIAGNOSIS — I10 Essential (primary) hypertension: Secondary | ICD-10-CM

## 2024-03-31 DIAGNOSIS — M150 Primary generalized (osteo)arthritis: Secondary | ICD-10-CM | POA: Insufficient documentation

## 2024-03-31 DIAGNOSIS — K219 Gastro-esophageal reflux disease without esophagitis: Secondary | ICD-10-CM

## 2024-03-31 DIAGNOSIS — M79671 Pain in right foot: Secondary | ICD-10-CM | POA: Diagnosis not present

## 2024-03-31 DIAGNOSIS — R0683 Snoring: Secondary | ICD-10-CM

## 2024-03-31 DIAGNOSIS — R928 Other abnormal and inconclusive findings on diagnostic imaging of breast: Secondary | ICD-10-CM | POA: Insufficient documentation

## 2024-03-31 DIAGNOSIS — E559 Vitamin D deficiency, unspecified: Secondary | ICD-10-CM | POA: Diagnosis not present

## 2024-03-31 DIAGNOSIS — F419 Anxiety disorder, unspecified: Secondary | ICD-10-CM | POA: Diagnosis not present

## 2024-03-31 DIAGNOSIS — R202 Paresthesia of skin: Secondary | ICD-10-CM | POA: Insufficient documentation

## 2024-03-31 DIAGNOSIS — M5430 Sciatica, unspecified side: Secondary | ICD-10-CM | POA: Insufficient documentation

## 2024-03-31 DIAGNOSIS — F32A Depression, unspecified: Secondary | ICD-10-CM | POA: Diagnosis not present

## 2024-03-31 DIAGNOSIS — T502X5A Adverse effect of carbonic-anhydrase inhibitors, benzothiadiazides and other diuretics, initial encounter: Secondary | ICD-10-CM

## 2024-03-31 DIAGNOSIS — R0681 Apnea, not elsewhere classified: Secondary | ICD-10-CM

## 2024-03-31 DIAGNOSIS — F5104 Psychophysiologic insomnia: Secondary | ICD-10-CM | POA: Insufficient documentation

## 2024-03-31 MED ORDER — SERTRALINE HCL 50 MG PO TABS
50.0000 mg | ORAL_TABLET | Freq: Every day | ORAL | 3 refills | Status: DC
  Filled 2024-03-31: qty 30, 30d supply, fill #0
  Filled 2024-04-28: qty 30, 30d supply, fill #1
  Filled 2024-06-06: qty 30, 30d supply, fill #2
  Filled 2024-07-21: qty 30, 30d supply, fill #3

## 2024-03-31 NOTE — Patient Instructions (Signed)
 I ordered a home sleep study to screen you for sleep apnea.  You will hear from the [facility] about this test.  I referred you to [facility] in [place].  They will call you to schedule a visit regarding right foot pain.  I increased your dose of sertraline .  Continue seeing your therapist.  I will see you back when you are due for your next fasting complete physical exam.  Please be sure to see your OB/GYN annually.

## 2024-03-31 NOTE — Progress Notes (Signed)
 Subjective:     Patient ID: Ashley Davis, female    DOB: 07/30/1966, 58 y.o.   MRN: [id number]  Chief Complaint  Patient presents with   Medical Management of Chronic Issues    Notices zoloft  irritates stomach and gives headaches  Wants medication for gout flare ups    HPI  Discussed the use of AI scribe software for clinical note transcription with the patient, who gave verbal consent to proceed.  History of Present Illness Ashley Davis is a 58 year old female who presents for follow-up on anxiety and depression.  Mood disturbance and anxiety - Sertraline  initiated two months ago at 25 mg nightly - Initial sleepiness resolved after two weeks - Improved mood and decreased anxiety, especially when driving with her husband - Therapy through [facility] program is beneficial  Gastrointestinal symptoms - Burning sensation in the stomach began three weeks ago - Sertraline  taken after dinner - Uses over-the-counter omeprazole for acid reflux - Burning occurs at night, especially during night shifts, but not during daytime sleep  Sleep disturbance and possible sleep apnea - Sleep disturbances present - Episodes of apnea and heavy snoring observed by her husband - Wakes up tired   Foot pain and swelling - Pain and swelling in the feet, particularly in the big toe - Diagnosed with gout seven years ago - Uric acid level is 4.7 - Flare-ups associated with shrimp and sodas -History of tendonitis and received injection in right foot by Dr. Magdalen in the past  - Avoids regular ibuprofen  use but took 600 mg last week, which alleviated pain      Health Maintenance Due  Topic Date Due   HIV Screening  Never done   Hepatitis C Screening  Never done   Hepatitis B Vaccines (1 of 3 - 19+ 3-dose series) Never done   Pneumococcal Vaccine: 19-49 Years (2 of 2 - PCV) 04/26/2015   Pneumococcal Vaccine: 50+ Years (2 of 2 - PCV) 04/26/2015   Zoster Vaccines- Shingrix  (1 of 2) Never done   MAMMOGRAM  10/16/2020   INFLUENZA VACCINE  04/01/2024    Past Medical History:  Diagnosis Date   Abnormal findings on diagnostic imaging of breast 03/31/2024   Allergy    Anemia    Anxiety    Arthritis    Asthma    Depression    GERD (gastroesophageal reflux disease)    History of bronchitis    Hyperlipidemia    Hypertension    Pt states her HTN is due to her migraines   Mammographic breast lesion 03/31/2024   Migraine    PONV (postoperative nausea and vomiting)    Tonsil stone 04/25/2019    Past Surgical History:  Procedure Laterality Date   BREAST BIOPSY     COLONOSCOPY  11/17/2022   IR ANGIOGRAM PELVIS SELECTIVE OR SUPRASELECTIVE  04/07/2017   IR ANGIOGRAM PELVIS SELECTIVE OR SUPRASELECTIVE  04/07/2017   IR ANGIOGRAM SELECTIVE EACH ADDITIONAL VESSEL  04/07/2017   IR ANGIOGRAM SELECTIVE EACH ADDITIONAL VESSEL  04/07/2017   IR ANGIOGRAM VISCERAL SELECTIVE  04/07/2017   IR EMBO TUMOR ORGAN ISCHEMIA INFARCT INC GUIDE ROADMAPPING  04/07/2017   IR RADIOLOGIST EVAL & MGMT  01/01/2017   IR RADIOLOGIST EVAL & MGMT  04/30/2017   IR US  GUIDE VASC ACCESS RIGHT  04/07/2017    Family History  Problem Relation Age of Onset   Cancer Mother        endometrioisis   Diabetes Mother  Stroke Mother    Headache Mother    Diabetes Father    Other Father        neuropathy   Headache Sister    Aneurysm Sister        3 brain surgeries   Stomach cancer Maternal Grandmother    Cancer Other    Colon cancer Neg Hx    Esophageal cancer Neg Hx    Rectal cancer Neg Hx    Ovarian cancer Neg Hx    Pancreatic cancer Neg Hx    Prostate cancer Neg Hx    Breast cancer Neg Hx     Social History   Socioeconomic History   Marital status: Married    Spouse name: Darren   Number of children: 2   Years of education: Nature conservation officer education level: Associate degree: occupational, Scientist, product/process development, or vocational program  Occupational History    Comment: Med Tech,  [employer]   Occupation: [employer]  Tobacco Use   Smoking status: Former    Current packs/day: 0.00    Average packs/day: 0.3 packs/day for 30.0 years (7.5 ttl pk-yrs)    Types: Cigarettes    Start date: 04/07/1987    Quit date: 04/06/2017    Years since quitting: 6.9   Smokeless tobacco: Never  Vaping Use   Vaping status: Never Used  Substance and Sexual Activity   Alcohol use: Yes    Comment: occasional   Drug use: No   Sexual activity: Yes    Birth control/protection: Post-menopausal  Other Topics Concern   Not on file  Social History Narrative   Patient lives at home with family.   Caffeine  Use: 1 cup 2-3 times weekly   Social Drivers of Health   Financial Resource Strain: Low Risk  (11/12/2023)   Overall Financial Resource Strain (CARDIA)    Difficulty of Paying Living Expenses: Not hard at all  Food Insecurity: No Food Insecurity (11/12/2023)   Hunger Vital Sign    Worried About Running Out of Food in the Last Year: Never true    Ran Out of Food in the Last Year: Never true  Transportation Needs: No Transportation Needs (11/12/2023)   PRAPARE - Administrator, Civil Service (Medical): No    Lack of Transportation (Non-Medical): No  Physical Activity: Insufficiently Active (11/12/2023)   Exercise Vital Sign    Days of Exercise per Week: 2 days    Minutes of Exercise per Session: 30 min  Stress: Stress Concern Present (11/12/2023)   [employer] of Occupational Health - Occupational Stress Questionnaire    Feeling of Stress : Very much  Social Connections: Moderately Integrated (11/12/2023)   Social Connection and Isolation Panel    Frequency of Communication with Friends and Family: More than three times a week    Frequency of Social Gatherings with Friends and Family: Once a week    Attends Religious Services: Never    Database administrator or Organizations: Yes    Attends Engineer, structural: 1 to 4 times per year    Marital Status:  Married  Catering manager Violence: Not on file    Outpatient Medications Prior to Visit  Medication Sig Dispense Refill   acetaminophen  (TYLENOL ) 500 MG tablet Take 1,000 mg by mouth every 6 (six) hours as needed for headache.     acetaminophen  (TYLENOL ) 650 MG CR tablet Take 1,300 mg by mouth every 8 (eight) hours as needed for pain (Arthritis).  ALPRAZolam  (XANAX ) 0.25 MG tablet Take 1 tablet (0.25 mg total) by mouth 2 (two) times daily as needed for anxiety. 20 tablet 0   aspirin  EC 81 MG tablet Take 81 mg by mouth daily. Swallow whole.     fluticasone  (FLONASE ) 50 MCG/ACT nasal spray Place 2 sprays into both nostrils daily until directed to stop. (Patient taking differently: Place 2 sprays into both nostrils daily as needed for allergies.) 48 g 3   loratadine  (CLARITIN ) 10 MG tablet Take 1 tablet (10 mg total) by mouth daily. 90 tablet 0   Magnesium Oxide 240 MG PACK Take 480 mg by mouth daily.     Naphazoline-Pheniramine (EYE ALLERGY RELIEF OP) Place 1 drop into both eyes daily as needed (itchy eyes).     olmesartan -hydrochlorothiazide  (BENICAR  HCT) 40-12.5 MG tablet Take 1 tablet by mouth daily. 90 tablet 0   omeprazole (PRILOSEC) 20 MG capsule Take 40 mg by mouth daily as needed (Heartburn).     OVER THE COUNTER MEDICATION Take 1 tablet by mouth daily. Pro biotic     OVER THE COUNTER MEDICATION Take 600 mg by mouth daily. Calcium     potassium chloride  (KLOR-CON  M) 10 MEQ tablet Take 1 tablet (10 mEq total) by mouth daily. Please schedule appointment with Alyssandra Hulsebus for refills. 30 tablet 1   sertraline  (ZOLOFT ) 25 MG tablet Take 1 tablet (25 mg total) by mouth daily. 30 tablet 2   Cholecalciferol (VITAMIN D3) 125 MCG (5000 UT) CAPS Take 10,000 Units by mouth 3 (three) times a week. (Patient not taking: Reported on 03/31/2024)     senna-docusate (SENOKOT-S) 8.6-50 MG tablet Take 2 tablets by mouth at bedtime. For AFTER surgery, do not take if having diarrhea (Patient not taking: Reported  on 03/31/2024) 30 tablet 0   No facility-administered medications prior to visit.    Allergies  Allergen Reactions   Augmentin  [Amoxicillin -Pot Clavulanate] Palpitations    Has patient had a PCN reaction causing immediate rash, facial/tongue/throat swelling, SOB or lightheadedness with hypotension: yes, Heart palpitations and chest pressure Has patient had a PCN reaction causing severe rash involving mucus membranes or skin necrosis: no Has patient had a PCN reaction that required hospitalization: no Has patient had a PCN reaction occurring within the last 10 years: yes If all of the above answers are NO, then may proceed with Cephalosporin use.   Shellfish Allergy Hives    Review of Systems  Constitutional:  Positive for malaise/fatigue. Negative for chills and fever.  HENT:  Negative for congestion and sore throat.   Eyes:  Negative for blurred vision and double vision.  Respiratory:  Negative for cough and shortness of breath.   Cardiovascular:  Negative for chest pain, palpitations and leg swelling.  Gastrointestinal:  Positive for abdominal pain and nausea. Negative for constipation, diarrhea and vomiting.  Genitourinary:  Negative for dysuria, frequency and urgency.  Musculoskeletal:  Positive for joint pain.  Skin:  Negative for rash.  Neurological:  Negative for dizziness, focal weakness and headaches.  Psychiatric/Behavioral:  Negative for depression. The patient is nervous/anxious.        Objective:    Physical Exam Constitutional:      General: She is not in acute distress.    Appearance: She is not ill-appearing.  HENT:     Mouth/Throat:     Mouth: Mucous membranes are moist.     Pharynx: Oropharynx is clear.  Eyes:     Extraocular Movements: Extraocular movements intact.     Conjunctiva/sclera: Conjunctivae  normal.  Cardiovascular:     Rate and Rhythm: Normal rate.  Pulmonary:     Effort: Pulmonary effort is normal.  Musculoskeletal:        General:  Normal range of motion.     Cervical back: Normal range of motion and neck supple.     Right lower leg: No edema.     Left lower leg: No edema.  Skin:    General: Skin is warm and dry.  Neurological:     General: No focal deficit present.     Mental Status: She is alert and oriented to person, place, and time.     Cranial Nerves: No cranial nerve deficit.     Motor: No weakness.     Coordination: Coordination normal.     Gait: Gait normal.  Psychiatric:        Mood and Affect: Mood normal.        Behavior: Behavior normal.        Thought Content: Thought content normal.      BP 116/76 (BP Location: Left Arm, Patient Position: Sitting)   Pulse 80   Temp 97.8 F (36.6 C) (Temporal)   Ht 5' 9 (1.753 m)   LMP 03/31/2017 (Exact Date) Comment: neg preg test 04/07/17  SpO2 98%   BMI 44.30 kg/m  Wt Readings from Last 3 Encounters:  01/22/24 300 lb (136.1 kg)  11/12/23 300 lb (136.1 kg)  08/19/23 294 lb 3.2 oz (133.4 kg)       Assessment & Plan:   Problem List Items Addressed This Visit     Anxiety and depression   Relevant Medications   sertraline  (ZOLOFT ) 50 MG tablet   Gastro-esophageal reflux disease without esophagitis   Mixed hyperlipidemia   Primary hypertension - Primary   Severe obesity (BMI >= 40) (HCC)   Vitamin D  deficiency   Other Visit Diagnoses       Snoring       Relevant Orders   Home sleep test     Witnessed episode of apnea       Relevant Orders   Home sleep test     Right foot pain       Relevant Orders   Ambulatory referral to Podiatry      Assessment and Plan Assessment & Plan Anxiety and Depression She has been on sertraline  25 mg for two months, resulting in improved mood and reduced anxiety, especially when driving with her husband. She finds therapy through the [facility] beneficial. The burning sensation in her stomach likely relates to acid reflux rather than sertraline , as symptoms began three weeks ago, not immediately after  starting the medication. Increasing the sertraline  dose is considered due to her positive response and potential for further anxiety symptom improvement. - Increase sertraline  to 50 mg daily - Continue therapy sessions through [facility] - Monitor for correlation between burning sensation and dietary habits, particularly acidic or sugary foods  Gastroesophageal Reflux Disease (GERD) She experiences burning in her stomach, potentially exacerbated by night shift work and dietary habits, such as consuming fruits at night. Symptoms are suspected to be related to GERD rather than sertraline . She is currently taking over-the-counter omeprazole. - Continue omeprazole as needed - Monitor dietary habits, particularly acidic or sugary foods, to identify potential triggers  Right Foot Pain with Tendinitis She experiences intermittent right foot pain, swelling, and tenderness, associated with dietary triggers like shrimp and soda. Low uric acid levels suggest the pain may not be  due to gout. A history of tendinitis diagnosed by Dr. Magdalen in 2018 may contribute to her symptoms. Referral to Dr. Magdalen is planned for further evaluation, considering tendinitis rather than gout. - Refer to Dr. Magdalen at [facility] for evaluation of chronic right foot pain and tendinitis  Suspected Sleep Apnea Her husband has observed apnea and snoring during sleep. She reports waking up tired, raising suspicion for sleep apnea. A home sleep test is ordered to confirm the diagnosis, with scheduling expected in four to six weeks. - Order home sleep test through [facility] Maintenance She is scheduled for a cardiology follow-up next month. No new blood work is needed as she had a comprehensive workup in March. - Attend cardiology appointment next month    I have discontinued Braniyah D. Lupe's sertraline . I am also having her start on sertraline . Additionally, I am having her maintain  her loratadine , fluticasone , OVER THE COUNTER MEDICATION, OVER THE COUNTER MEDICATION, aspirin  EC, senna-docusate, Magnesium Oxide, Vitamin D3, omeprazole, acetaminophen , acetaminophen , Naphazoline-Pheniramine (EYE ALLERGY RELIEF OP), potassium chloride , olmesartan -hydrochlorothiazide , and ALPRAZolam .  Meds ordered this encounter  Medications   sertraline  (ZOLOFT ) 50 MG tablet    Sig: Take 1 tablet (50 mg total) by mouth daily.    Dispense:  30 tablet    Refill:  3    Supervising Provider:   ROLLENE NORRIS A [[id number]]

## 2024-04-01 ENCOUNTER — Other Ambulatory Visit (HOSPITAL_COMMUNITY): Payer: Self-pay

## 2024-04-01 MED ORDER — POTASSIUM CHLORIDE CRYS ER 10 MEQ PO TBCR
10.0000 meq | EXTENDED_RELEASE_TABLET | Freq: Every day | ORAL | 2 refills | Status: DC
  Filled 2024-04-01: qty 30, 30d supply, fill #0
  Filled 2024-04-28: qty 30, 30d supply, fill #1
  Filled 2024-06-06: qty 30, 30d supply, fill #2

## 2024-04-14 ENCOUNTER — Ambulatory Visit: Admitting: Podiatry

## 2024-04-21 ENCOUNTER — Ambulatory Visit: Admitting: Podiatry

## 2024-04-25 ENCOUNTER — Ambulatory Visit: Admitting: Cardiology

## 2024-04-26 ENCOUNTER — Ambulatory Visit: Admitting: Podiatry

## 2024-04-28 ENCOUNTER — Other Ambulatory Visit: Payer: Self-pay

## 2024-04-28 ENCOUNTER — Other Ambulatory Visit: Payer: Self-pay | Admitting: Family Medicine

## 2024-04-28 ENCOUNTER — Other Ambulatory Visit (HOSPITAL_COMMUNITY): Payer: Self-pay

## 2024-04-28 ENCOUNTER — Encounter (HOSPITAL_COMMUNITY): Payer: Self-pay

## 2024-04-28 DIAGNOSIS — I10 Essential (primary) hypertension: Secondary | ICD-10-CM

## 2024-04-28 MED ORDER — ALPRAZOLAM 0.25 MG PO TABS
0.2500 mg | ORAL_TABLET | Freq: Two times a day (BID) | ORAL | 0 refills | Status: DC | PRN
  Filled 2024-04-28 (×2): qty 20, 10d supply, fill #0

## 2024-04-28 MED ORDER — OLMESARTAN MEDOXOMIL-HCTZ 40-12.5 MG PO TABS
1.0000 | ORAL_TABLET | Freq: Every day | ORAL | 0 refills | Status: DC
  Filled 2024-04-28 (×2): qty 90, 90d supply, fill #0

## 2024-04-28 NOTE — Telephone Encounter (Signed)
 Xanax  last fill: 01/22/24, 20 tablet 0 refill LOV: 03/31/24

## 2024-04-29 ENCOUNTER — Other Ambulatory Visit: Payer: Self-pay

## 2024-05-04 ENCOUNTER — Ambulatory Visit (HOSPITAL_BASED_OUTPATIENT_CLINIC_OR_DEPARTMENT_OTHER): Attending: Family Medicine | Admitting: Internal Medicine

## 2024-05-04 DIAGNOSIS — R0683 Snoring: Secondary | ICD-10-CM | POA: Insufficient documentation

## 2024-05-04 DIAGNOSIS — G4733 Obstructive sleep apnea (adult) (pediatric): Secondary | ICD-10-CM | POA: Insufficient documentation

## 2024-05-04 DIAGNOSIS — R0681 Apnea, not elsewhere classified: Secondary | ICD-10-CM

## 2024-05-05 ENCOUNTER — Encounter: Admitting: Family Medicine

## 2024-05-12 ENCOUNTER — Encounter: Admitting: Family Medicine

## 2024-05-19 ENCOUNTER — Telehealth: Payer: Self-pay | Admitting: Radiology

## 2024-05-19 NOTE — Telephone Encounter (Signed)
 Copied from CRM [id number]. Topic: Clinical - Lab/Test Results >> May 18, 2024  3:39 PM Ashley Davis wrote: Reason for CRM: Patient is wanting an update on the results from her sleep study. She is wondering why no one has reached out.

## 2024-05-19 NOTE — Telephone Encounter (Signed)
 LM for pt we do not have results yet but will reach out when we do

## 2024-05-29 NOTE — Procedures (Signed)
 Darryle Law [facility] [address] [place], [place] [address] Tel: [phone]   Fax: [phone]  Home Sleep Test Interpretation  Patient Name: Ashley Davis, Ashley Davis Study Date: 05/04/2024  Date of Birth: [birth year] Study Type: HST  Age: 58 year MRN #: [id number]  Sex: Female Interpreting Physician: NEYSA RAMA, [id number]  Height: 5' 9 Referring Physician: BOBY MACKINTOSH [phone])  Weight: 300.0 lbs Recording Tech: Will Poet RRT RPSGT RST  BMI: 44.4 Scoring Tech: Will Poet RRT RPSGT RST  ESS: 0 Neck Size: 15.5  %%startinterp%% %%startinterp%% Indications for Polysomnography The patient is a 58 year-old Female who is 5' 9 and weighs 300.0 lbs. Her BMI equals 44.4.  A home sleep apnea test was performed to evaluate for -OSA.  Medication  No Data.   Polysomnogram Data A home sleep test recorded the standard physiologic parameters including EKG, nasal and oral airflow.  Respiratory parameters of chest and abdominal movements were recorded with Respiratory Inductance Plethysmography belts.  Oxygen saturation was recorded by pulse oximetry.   Study Architecture The total recording time of the polysomnogram was 405.5 minutes.  The total monitoring time was 406.0 minutes.  Time spent in Supine position was 208.0 minutes.   Respiratory Events The study revealed a presence of 6 obstructive, - central, and - mixed apneas resulting in an Apnea index of 0.9 events per hour.  There were 37 hypopneas (>=3% desaturation and/or arousal) resulting in an Apnea\Hypopnea Index (AHI >=3% desaturation and/or arousal) of 6.4 events per hour.  There were 28 hypopneas (>=4% desaturation) resulting in an Apnea\Hypopnea Index (AHI >=4% desaturation) of 5.0 events per hour.  There were - Respiratory Effort Related Arousals resulting in a RERA index of - events per hour. The Respiratory Disturbance Index is 6.4 events per hour.  The snore index was - events per hour.  Mean  oxygen saturation was 93.7%.  The lowest oxygen saturation during monitoring time was 79.0%.  Time spent <=88% oxygen saturation was 3.7 minutes (0.9%).  Cardiac Summary The average pulse rate was 72.7 bpm.  The minimum pulse rate was 46.0 bpm while the maximum pulse rate was 103.0 bpm.    Comments: Mild obstructive sleep apnea, AHI (3%) 6.4/ hr. Snoring with oxygen desaturation to a nadir of 9%, mean 93.7%.  Diagnosis: Obstructive sleep apnea  Recommendations: Conservative measures for very mild OSA may include observation, weight loss and sleep position off back. Other options, including autopap/ CPAP titration sleep study or fitted oral appliance, would be based on clinical judgment.   This study was personally reviewed and electronically signed by: Dr. RAMA JONETTA NEYSA Accredited Board Certified in Sleep Medicine Date/Time: 06/04/24   1:09    %%endinterp%% %%endinterp%%    Study Overview  Recording Time: 584.2 min. Monitoring Time: 406.0 min.  Analysis Start:  10:17:51 PM Supine Time: 208.0 min.  Analysis Stop:  05:03:21 AM     Study Summary   Count Index Longest Event Duration  Apneas & Hypopneas: 43 6.4  Apneas: 20.4 sec.     Hypopneas: 45.4 sec.  RERAs: - - - sec.  Desaturations: 71 10.5 140.0 sec.  Snores: - - - sec.    Minimum Oxygen Saturation: 79.0%    Respiratory Summary   Total Duration Supine Non-Supine   Count Index Average Longest Count Index Count Index  Obstructive Apnea 6 0.9 15.3 20.4 5 1.4 1 0.3   Mixed Apnea - - - - - - - -   Central Apnea - - - - - - - -  Total Apneas 6 0.9 15.3 20.4 5 1.4 1 0.3            Hypopneas 3% 37 5.5 N.A. N.A. 34 9.8 3 0.9   Apneas & Hyp. 3% 43 6.4 N.A. N.A. 39 11.3 4 1.2            Hypopneas 4% 28 4.1 N.A. N.A. 25 7.2 3 0.9  Apneas & Hyp. 4% 34 5.0 N.A. N.A. 30 8.7 4 1.2             RERAs - - - - - - - -  RDI 44 6.5 N.A. N.A. 40 11.5 4 1.2   Oxygen Saturation Summary   Total Supine Non-Supine  Average SpO2  93.7% 93.4% 94.1%  Minimum SpO2 79.0% 81.0% 79.0%   Maximum SpO2 99.0% 98.0% 99.0%   Oxygen Saturation Distribution  Range (%) Time in range (min) Time in range (%)  90.0 - 100.0 396.0 98.1%  80.0 - 90.0 7.7 1.9%  70.0 - 80.0 0.2 0.0%  60.0 - 70.0 - -  50.0 - 60.0 - -  0.0 - 50.0 - -  Time Spent <=88% SpO2  Range (%) Time in range (min) Time in range (%)  0.0 - 88.0 3.7 0.9%  Cardiac Summary   Total Supine Non-Supine  Average Pulse Rate (BPM) 72.7 71.5 74.0  Minimum Pulse Rate (BPM) 46.0 46.0 63.0  Maximum Pulse Rate (BPM) 103.0 103.0 98.0                      Technologist Comments  -                        Reggy Neysa Bateman, Biomedical engineer of Sleep Medicine  ELECTRONICALLY SIGNED ON:  05/29/2024, 1:37 PM [facility] PH: [phone]   FX: [phone] ACCREDITED BY THE [employer]

## 2024-06-01 ENCOUNTER — Encounter: Admitting: Family Medicine

## 2024-06-04 DIAGNOSIS — R0683 Snoring: Secondary | ICD-10-CM | POA: Diagnosis not present

## 2024-06-06 ENCOUNTER — Other Ambulatory Visit: Payer: Self-pay

## 2024-06-07 DIAGNOSIS — G4733 Obstructive sleep apnea (adult) (pediatric): Secondary | ICD-10-CM

## 2024-06-22 ENCOUNTER — Ambulatory Visit: Attending: Cardiology | Admitting: Cardiology

## 2024-06-29 ENCOUNTER — Encounter: Admitting: Family Medicine

## 2024-07-21 ENCOUNTER — Other Ambulatory Visit: Payer: Self-pay

## 2024-07-21 ENCOUNTER — Other Ambulatory Visit (HOSPITAL_COMMUNITY): Payer: Self-pay

## 2024-07-21 ENCOUNTER — Other Ambulatory Visit: Payer: Self-pay | Admitting: Family Medicine

## 2024-07-21 MED ORDER — ALPRAZOLAM 0.25 MG PO TABS
0.2500 mg | ORAL_TABLET | Freq: Two times a day (BID) | ORAL | 0 refills | Status: AC | PRN
  Filled 2024-07-21: qty 20, 10d supply, fill #0

## 2024-07-21 NOTE — Telephone Encounter (Signed)
 LOV: 03/31/24 Last fill: 04/28/24, 20 tablet 0 refill

## 2024-07-29 DIAGNOSIS — G4733 Obstructive sleep apnea (adult) (pediatric): Secondary | ICD-10-CM | POA: Diagnosis not present

## 2024-08-28 DIAGNOSIS — G4733 Obstructive sleep apnea (adult) (pediatric): Secondary | ICD-10-CM | POA: Diagnosis not present

## 2024-09-11 ENCOUNTER — Other Ambulatory Visit: Payer: Self-pay | Admitting: Family Medicine

## 2024-09-11 DIAGNOSIS — E876 Hypokalemia: Secondary | ICD-10-CM

## 2024-09-11 DIAGNOSIS — I10 Essential (primary) hypertension: Secondary | ICD-10-CM

## 2024-09-12 ENCOUNTER — Other Ambulatory Visit (HOSPITAL_COMMUNITY): Payer: Self-pay

## 2024-09-12 ENCOUNTER — Other Ambulatory Visit: Payer: Self-pay

## 2024-09-12 MED ORDER — POTASSIUM CHLORIDE CRYS ER 10 MEQ PO TBCR
10.0000 meq | EXTENDED_RELEASE_TABLET | Freq: Every day | ORAL | 2 refills | Status: AC
  Filled 2024-09-12: qty 30, 30d supply, fill #0

## 2024-09-12 MED ORDER — SERTRALINE HCL 50 MG PO TABS
50.0000 mg | ORAL_TABLET | Freq: Every day | ORAL | 2 refills | Status: AC
  Filled 2024-09-12: qty 30, 30d supply, fill #0

## 2024-09-12 MED ORDER — OLMESARTAN MEDOXOMIL-HCTZ 40-12.5 MG PO TABS
1.0000 | ORAL_TABLET | Freq: Every day | ORAL | 0 refills | Status: AC
  Filled 2024-09-12: qty 90, 90d supply, fill #0

## 2024-09-28 ENCOUNTER — Encounter: Admitting: Family Medicine

## 2024-10-12 ENCOUNTER — Encounter: Admitting: Family Medicine
